# Patient Record
Sex: Female | Born: 1960 | Race: White | Hispanic: No | Marital: Married | Smoking: Never smoker
Health system: Southern US, Community
[De-identification: ages and names within clinical notes are randomized; demographics above are authoritative.]

## PROBLEM LIST (undated history)

## (undated) DIAGNOSIS — I8390 Asymptomatic varicose veins of unspecified lower extremity: Secondary | ICD-10-CM

## (undated) DIAGNOSIS — N6019 Diffuse cystic mastopathy of unspecified breast: Secondary | ICD-10-CM

## (undated) DIAGNOSIS — R600 Localized edema: Secondary | ICD-10-CM

## (undated) DIAGNOSIS — I872 Venous insufficiency (chronic) (peripheral): Secondary | ICD-10-CM

## (undated) DIAGNOSIS — I82409 Acute embolism and thrombosis of unspecified deep veins of unspecified lower extremity: Secondary | ICD-10-CM

## (undated) DIAGNOSIS — E039 Hypothyroidism, unspecified: Secondary | ICD-10-CM

## (undated) HISTORY — PX: OTHER SURGICAL HISTORY: SHX169

## (undated) HISTORY — DX: Acute embolism and thrombosis of unspecified deep veins of unspecified lower extremity: I82.409

## (undated) HISTORY — DX: Asymptomatic varicose veins of unspecified lower extremity: I83.90

## (undated) HISTORY — PX: DILATION AND CURETTAGE OF UTERUS: SHX78

## (undated) HISTORY — DX: Venous insufficiency (chronic) (peripheral): I87.2

## (undated) HISTORY — DX: Localized edema: R60.0

## (undated) HISTORY — DX: Hypothyroidism, unspecified: E03.9

## (undated) HISTORY — DX: Diffuse cystic mastopathy of unspecified breast: N60.19

---

## 2001-03-05 DIAGNOSIS — I82409 Acute embolism and thrombosis of unspecified deep veins of unspecified lower extremity: Secondary | ICD-10-CM

## 2001-03-05 HISTORY — DX: Acute embolism and thrombosis of unspecified deep veins of unspecified lower extremity: I82.409

## 2003-03-06 HISTORY — PX: TUBAL LIGATION: SHX77

## 2004-09-15 ENCOUNTER — Ambulatory Visit: Payer: Self-pay | Admitting: General Surgery

## 2004-09-29 ENCOUNTER — Ambulatory Visit: Payer: Self-pay | Admitting: General Surgery

## 2005-07-13 ENCOUNTER — Ambulatory Visit: Payer: Self-pay

## 2005-08-24 ENCOUNTER — Ambulatory Visit: Payer: Self-pay | Admitting: General Surgery

## 2006-03-05 HISTORY — PX: OTHER SURGICAL HISTORY: SHX169

## 2006-12-04 ENCOUNTER — Ambulatory Visit: Payer: Self-pay | Admitting: General Surgery

## 2006-12-25 ENCOUNTER — Ambulatory Visit: Payer: Self-pay | Admitting: General Surgery

## 2007-07-18 ENCOUNTER — Ambulatory Visit: Payer: Self-pay | Admitting: General Surgery

## 2009-04-12 ENCOUNTER — Ambulatory Visit: Payer: Self-pay

## 2009-04-15 ENCOUNTER — Ambulatory Visit: Payer: Self-pay

## 2009-04-20 ENCOUNTER — Ambulatory Visit: Payer: Self-pay | Admitting: General Surgery

## 2009-10-19 ENCOUNTER — Ambulatory Visit: Payer: Self-pay | Admitting: Internal Medicine

## 2009-10-19 DIAGNOSIS — E039 Hypothyroidism, unspecified: Secondary | ICD-10-CM | POA: Insufficient documentation

## 2009-10-19 DIAGNOSIS — I872 Venous insufficiency (chronic) (peripheral): Secondary | ICD-10-CM | POA: Insufficient documentation

## 2009-10-21 LAB — CONVERTED CEMR LAB
ALT: 16 units/L (ref 0–35)
Albumin: 4 g/dL (ref 3.5–5.2)
Basophils Relative: 0.8 % (ref 0.0–3.0)
CO2: 28 meq/L (ref 19–32)
Calcium: 8.9 mg/dL (ref 8.4–10.5)
Cholesterol: 191 mg/dL (ref 0–200)
Creatinine, Ser: 0.7 mg/dL (ref 0.4–1.2)
Eosinophils Relative: 0.6 % (ref 0.0–5.0)
Free T4: 1.06 ng/dL (ref 0.60–1.60)
HCT: 39.3 % (ref 36.0–46.0)
HDL: 46.9 mg/dL (ref >39.00)
Lymphs Abs: 2.5 10*3/uL (ref 0.7–4.0)
MCHC: 34.2 g/dL (ref 30.0–36.0)
MCV: 89.2 fL (ref 78.0–100.0)
Monocytes Absolute: 0.5 10*3/uL (ref 0.1–1.0)
Platelets: 354 10*3/uL (ref 150.0–400.0)
Sodium: 138 meq/L (ref 135–145)
TSH: 1.5 microintl units/mL (ref 0.35–5.50)
Total Bilirubin: 0.3 mg/dL (ref 0.3–1.2)
Total Protein: 7 g/dL (ref 6.0–8.3)
Triglycerides: 154 mg/dL — ABNORMAL HIGH (ref 0.0–149.0)
WBC: 8.5 10*3/uL (ref 4.5–10.5)

## 2009-12-02 ENCOUNTER — Telehealth: Payer: Self-pay | Admitting: Internal Medicine

## 2009-12-02 DIAGNOSIS — R079 Chest pain, unspecified: Secondary | ICD-10-CM | POA: Insufficient documentation

## 2009-12-08 ENCOUNTER — Telehealth: Payer: Self-pay | Admitting: Internal Medicine

## 2010-04-04 NOTE — Progress Notes (Signed)
  Phone Note Call from Patient   Caller: Patient Summary of Call: Called the patient to schedule appt with Cardiology.  Patient was seen by Dr Gwen Pounds at [facility] yesterday 12/07/2009 , he did an EKG which he said it was not completely normal but said since BP and Chol were all good he wasnt too concerned with it. He is wanting her to have a Stress Test now but patient said she told him she would call him back about that. She asked that the records be sent to you. Initial call taken by: Carlton Adam,  December 08, 2009 10:56 AM  Follow-up for Phone Call        Okay Not much more to do since I have to review his records.  I would defer to his decision about a stress test Follow-up by: Cindee Salt MD,  December 08, 2009 12:54 PM

## 2010-04-04 NOTE — Assessment & Plan Note (Signed)
Summary: NEW PT TO EST/CLE   Vital Signs:  Patient profile:   50 year old female Height:      63 inches Weight:      164 pounds BMI:     29.16 Temp:     98.4 degrees F oral Pulse rate:   68 / minute Pulse rhythm:   regular BP sitting:   100 / 70  (left arm) Cuff size:   regular  Vitals Entered By: Mervin Hack CMA [facility]) (October 19, 2009 12:03 PM) CC: new patient to establish care   History of Present Illness: Wants to establish with primary care physician  Dr Sherrie Mustache and [facility] in past Does see Dr Luella Cook for gyn care Dr Evette Cristal does breast exams due to past abnormalities  ONly has hypothyroidism Goes back to 2006 or so  Had DVT in 2003 on OCP. Off since does get occ swelling  did have vein procedure but not much help sits much of the day only intermittent problems  Preventive Screening-Counseling & Management  Alcohol-Tobacco     Smoking Status: never  Allergies (verified): No Known Drug Allergies  Past History:  Past Medical History: Hypothyroidism DVT 2003   (on birth control pills) Venous insufficiency  Past Surgical History: Vein procedure --Dr Evette Cristal Tubal ligation D&C  ~2010  Family History: Parents are the Meeks (he is stepfather) Not familiar with biologic Dad's medical history Mom has CHF, hypothyroid, DM 2 sister, 1 brother Breast and ovarian cancer in a maternal aunt (and in some distant cousins). Believes they are BRCA positive Maternal aunt died of melanoma  Social History: Occupation:  Optometrist for [employer] Married--- 1 daughter Never Smoked Alcohol use-no Occupation:  employed Smoking Status:  never  Review of Systems General:  Complains of sleep disorder; has been gaining weight--makes her feel tired and sluggist no recent exercise but has gone to gym for spinning in past Wears seat belt. Eyes:  Denies double vision and vision loss-1 eye; has contacts. ENT:  Denies decreased hearing and ringing in  ears; teeth okay--regular check ups. CV:  Denies chest pain or discomfort, difficulty breathing at night, difficulty breathing while lying down, fainting, lightheadness, palpitations, and shortness of breath with exertion. Resp:  Denies cough and shortness of breath. GI:  Denies abdominal pain, bloody stools, change in bowel habits, dark tarry stools, indigestion, nausea, and vomiting. GU:  Denies dysuria and incontinence; Periods are regular No sexual problems. MS:  Denies joint pain and joint swelling. Derm:  Denies lesion(s) and rash. Neuro:  Complains of headaches; denies numbness, tingling, and weakness; fairly regular headaches---relates to stress. Only occ takes meds. Psych:  Denies anxiety and depression; stress with stepdad's illnesses and job stress (works 2 part time jobs also---bookkeeping). Heme:  Denies abnormal bruising and enlarge lymph nodes. Allergy:  Denies seasonal allergies and sneezing.  Physical Exam  General:  alert and normal appearance.   Eyes:  pupils equal, pupils round, and pupils reactive to light.   Ears:  R ear normal and L ear normal.   Mouth:  no erythema and no exudates.   Neck:  supple, no masses, no thyromegaly, no carotid bruits, and no cervical lymphadenopathy.   Lungs:  normal respiratory effort, no intercostal retractions, no accessory muscle use, and normal breath sounds.   Heart:  normal rate, regular rhythm, no murmur, and no gallop.   Abdomen:  soft, non-tender, and no masses.   Msk:  no joint tenderness and no joint swelling.  Pulses:  2+ in feet Extremities:  thick calves without pitting Neurologic:  alert & oriented X3, strength normal in all extremities, and gait normal.   Skin:  no suspicious lesions.  Slight non specific speckled red rash along lateral neck Axillary Nodes:  No palpable lymphadenopathy Psych:  normally interactive, good eye contact, not anxious appearing, and not depressed appearing.     Impression &  Recommendations:  Problem # 1:  PREVENTIVE HEALTH CARE (ICD-V70.0) Assessment Comment Only Healthy discussed fitness will check chol level  Problem # 2:  HYPOTHYROIDISM (ICD-244.9) Assessment: Comment Only  will check labs clinically euthyroid  Her updated medication list for this problem includes:    Synthroid 100 Mcg Tabs (Levothyroxine sodium) .Marland Kitchen... Take 1 by mouth once daily  Orders: Venipuncture (52841) TLB-TSH (Thyroid Stimulating Hormone) (84443-TSH) TLB-T4 (Thyrox), Free [phone]) TLB-Lipid Panel (80061-LIPID)  Problem # 3:  VENOUS INSUFFICIENCY (ICD-459.81) Assessment: Comment Only  discussed support hose if ongoing swelling or pain takes the ASA for this  Orders: TLB-Renal Function Panel (80069-RENAL) TLB-CBC Platelet - w/Differential (85025-CBCD) TLB-Hepatic/Liver Function Pnl (80076-HEPATIC)  Complete Medication List: 1)  Synthroid 100 Mcg Tabs (Levothyroxine sodium) .... Take 1 by mouth once daily 2)  Aspirin 81 Mg Tabs (Aspirin) .... Take 1 by mouth once daily 3)  Vitamin D 1000 Unit Tabs (Cholecalciferol) .Marland Kitchen.. 1 tab daily  Patient Instructions: 1)  Please schedule a follow-up appointment in 1 year.   Prior Medications: SYNTHROID 100 MCG TABS (LEVOTHYROXINE SODIUM) take 1 by mouth once daily ASPIRIN 81 MG TABS (ASPIRIN) take 1 by mouth once daily Current Allergies (reviewed today): No known allergies    Immunization History:  Tetanus/Td Immunization History:    Tetanus/Td:  Historical (10/03/2001)

## 2010-04-04 NOTE — Progress Notes (Signed)
Summary: Chest Pain  Phone Note Call from Patient Call back at Work Phone [phone]   Caller: Patient Call For: Cindee Salt MD Summary of Call: pt calling trying to get in to see Dr.Letvak today, pt was at another appt with Dr.Rosenow and she told him about her symptoms, feeling heavy in the chest, SOB and arm going numb. Dr.Rosenow suggested pt call her PCP or go to the ER. I advised pt that she would need to go to ER or Urgent care because Dr.Letvak wasn't in and this time of day most of the physicans are booked up. Pt states she doesn't want to go to the ER, pt states these symptoms happened last week and only lasted 10-40min. Pt states she is sitting in front of [facility] right now and want [place] to call and tell them she's there and need to be seen. I explained to pt it doesn't work like that, pt then asked if she could go to the urgent care right next door? and how long would it take them to run a EKG on her? Pt is going to the urgent care to be seen now. Initial call taken by: Mervin Hack CMA [facility]),  December 02, 2009 3:46 PM  Follow-up for Phone Call        Please check on her Cindee Salt MD  December 03, 2009 9:15 AM   spoke with patient and she went to [facility] beside [place] across from the hospital. Per pt, they both told her they couldn't see her, because they couldn't run the necessary tests that needed to be ran for a woman with chest pain. Pt also went to [facility] ER and they told her it was a 7hr wait, so she left. Pt states she did schedule appt with [facility] clinic on wednesday and they will do a EKG, Stress test & lab test to check for enzymes. Pt states that if they find anything wrong she would like to see Dr.Letvak so he can "order me a [place] [address]." Pt would liike to know if all these tests are necessary? I tried to schedule an appt for Dr.Letvak but patient could only come late wednesday (Dr.Letvak is at nursing home then) or late  Friday afternoon. Please advise. DeShannon Smith CMA [facility])  December 05, 2009 4:02 PM   Please let her know that we can get her an appt with Dr Mariah Milling of [facility] here in [place]. Probably can get her in by Wednesday also Please have Shirlee Limerick check about getting her in if she would like to do this There is no way to know if a stress test is necessary before the evaluation. I could see her before lunch on Wednesday or afternoonn Thursday if she wants to wait for me. Follow-up by: Cindee Salt MD,  December 05, 2009 5:52 PM  Additional Follow-up for Phone Call Additional follow up Details #1::        spoke with patient she would like to see [facility]. Wednesday afternoon or Friday afternoon, pt doesn't want to take off work. Please advise DeShannon Katrinka Blazing CMA [facility])  December 06, 2009 10:35 AM   we'll see if we can get her in Additional Follow-up by: Cindee Salt MD,  December 06, 2009 1:32 PM  New Problems: CHEST PAIN (ICD-786.50)   New Problems: CHEST PAIN (ICD-786.50)

## 2010-07-26 ENCOUNTER — Ambulatory Visit: Payer: Self-pay | Admitting: General Surgery

## 2011-08-10 ENCOUNTER — Ambulatory Visit: Payer: Self-pay | Admitting: General Surgery

## 2012-06-23 ENCOUNTER — Other Ambulatory Visit: Payer: Self-pay | Admitting: Internal Medicine

## 2012-06-23 ENCOUNTER — Encounter: Payer: Self-pay | Admitting: Internal Medicine

## 2012-06-23 ENCOUNTER — Ambulatory Visit (INDEPENDENT_AMBULATORY_CARE_PROVIDER_SITE_OTHER): Payer: BC Managed Care – PPO | Admitting: Internal Medicine

## 2012-06-23 VITALS — BP 120/82 | HR 77 | Temp 98.5°F | Wt 174.8 lb

## 2012-06-23 DIAGNOSIS — R5381 Other malaise: Secondary | ICD-10-CM

## 2012-06-23 DIAGNOSIS — E039 Hypothyroidism, unspecified: Secondary | ICD-10-CM

## 2012-06-23 DIAGNOSIS — R5383 Other fatigue: Secondary | ICD-10-CM | POA: Insufficient documentation

## 2012-06-23 LAB — BASIC METABOLIC PANEL
Calcium: 8.9 mg/dL (ref 8.4–10.5)
GFR: 84.23 mL/min (ref >60.00)
Sodium: 136 mEq/L (ref 135–145)

## 2012-06-23 LAB — CBC WITH DIFFERENTIAL/PLATELET
Basophils Absolute: 0.1 10*3/uL (ref 0.0–0.1)
Basophils Relative: 0.7 % (ref 0.0–3.0)
Hemoglobin: 13.4 g/dL (ref 12.0–15.0)
Lymphocytes Relative: 28.8 % (ref 12.0–46.0)
Monocytes Relative: 6.2 % (ref 3.0–12.0)
Neutro Abs: 4.6 10*3/uL (ref 1.4–7.7)
RBC: 4.61 Mil/uL (ref 3.87–5.11)

## 2012-06-23 LAB — HEPATIC FUNCTION PANEL
ALT: 17 U/L (ref 0–35)
Alkaline Phosphatase: 85 U/L (ref 39–117)
Bilirubin, Direct: 0 mg/dL (ref 0.0–0.3)
Total Protein: 7.6 g/dL (ref 6.0–8.3)

## 2012-06-23 LAB — T4, FREE: Free T4: 0.72 ng/dL (ref 0.60–1.60)

## 2012-06-23 NOTE — Addendum Note (Signed)
Addended by: Alvina Chou on: 06/23/2012 02:12 PM   Modules accepted: Orders

## 2012-06-23 NOTE — Progress Notes (Signed)
Subjective:    Patient ID: Taylor Frederick, female    DOB: 1960/12/11, 52 y.o.   MRN: [id number]  HPI Still sees Dr Luella Cook for gyn care and Dr Lemar Livings for breast exams  Here because she stays tired all the time Knows it could be related to her schedule Was having some bad pain in the night and needed motrin---worried about heart or gallbladder (this was tested by Dr Evette Cristal) Then more like legs and ankles-- "feel tired and achy"  Able to go to both jobs still Tired upon arising in the morning despite 7 hours in bed usually No time for exercise Keeps grandson after work---he and daughter live with them (daughter works in the evening) Works up to 60 hours per week over 6 days---only has Saturday off  Not depressed Denies anhedonic---especially loves being with grandson  No current outpatient prescriptions on file prior to visit.   No current facility-administered medications on file prior to visit.    No Known Allergies  Past Medical History  Diagnosis Date  . Hypothyroidism   . DVT (deep venous thrombosis) 2003    With BCP's  . Venous insufficiency     Past Surgical History  Procedure Laterality Date  . Vein procedure      Dr. Evette Cristal  . Tubal ligation    . Dilation and curettage of uterus  ~2003    Family History  Problem Relation Age of Onset  . Heart disease Mother     CHF  . Diabetes Mother   . Hypothyroidism Mother   . Cancer Maternal Aunt     Breast and Ovarian  (Believes BRCA positive)  . Cancer Other     Breast and ovarian CA (Believes BRCA positive)  . Cancer Maternal Aunt     Melanoma    History   Social History  . Marital Status: Married    Spouse Name: N/A    Number of Children: 1  . Years of Education: N/A   Occupational History  . Engineer, mining  . Office work     Part-time--[employer]   Social History Main Topics  . Smoking status: Never Smoker   . Smokeless tobacco: Not on file  . Alcohol  Use: No  . Drug Use: Not on file  . Sexually Active: Not on file   Other Topics Concern  . Not on file   Social History Narrative  . No narrative on file   Review of Systems Weight is up 10# since last visit in 2011 Appetite is okay No chest pain No dizziness or syncope--mild orthostatic dizziness at times No cough or SOB. Mild URI for the past 2 days Bowels are okay Some stress incontinence Does get some heartburn    Objective:   Physical Exam  Constitutional: She appears well-developed and well-nourished. No distress.  HENT:  Mouth/Throat: Oropharynx is clear and moist. No oropharyngeal exudate.  Neck: Normal range of motion. Neck supple. No thyromegaly present.  Cardiovascular: Normal rate, regular rhythm, normal heart sounds and intact distal pulses.  Exam reveals no gallop.   No murmur heard. Pulmonary/Chest: Effort normal and breath sounds normal. No respiratory distress. She has no wheezes. She has no rales.  Abdominal: Soft. She exhibits no mass. There is no tenderness.  No HSM  Musculoskeletal: She exhibits no edema and no tenderness.  No active synovitis  Lymphadenopathy:    She has no cervical adenopathy.    She has no axillary adenopathy.  Right: No inguinal adenopathy present.       Left: No inguinal adenopathy present.  Skin: No rash noted.  Psychiatric: She has a normal mood and affect. Her behavior is normal.          Assessment & Plan:

## 2012-06-23 NOTE — Assessment & Plan Note (Signed)
Sounds euthyroid Will check labs

## 2012-06-23 NOTE — Assessment & Plan Note (Signed)
No worrisome symptoms Working too many hours No vacation in at least a year Discussed that her aching, non restorative sleep, etc are probably from this Will check labs Probably needs to give up her second job

## 2012-06-26 ENCOUNTER — Encounter: Payer: Self-pay | Admitting: *Deleted

## 2012-07-16 ENCOUNTER — Telehealth: Payer: Self-pay | Admitting: *Deleted

## 2012-07-16 NOTE — Telephone Encounter (Signed)
Patient faxed over a health screening from that needs to be filled out before 08/02/2012, pt was in 06/2012 and had labs but her cholesterol wasn't checked, can she come in for cholesterol and nicotine testing? Please advise  Form on your desk.

## 2012-07-17 NOTE — Telephone Encounter (Signed)
Okay to set up the required tests Diagnosis is health maintenance (V70.0)

## 2012-07-21 NOTE — Telephone Encounter (Signed)
Spoke with patient and advised results  Lab appt scheduled  

## 2012-07-25 ENCOUNTER — Telehealth: Payer: Self-pay | Admitting: *Deleted

## 2012-07-25 ENCOUNTER — Other Ambulatory Visit (INDEPENDENT_AMBULATORY_CARE_PROVIDER_SITE_OTHER): Payer: BC Managed Care – PPO

## 2012-07-25 DIAGNOSIS — Z0000 Encounter for general adult medical examination without abnormal findings: Secondary | ICD-10-CM

## 2012-07-25 LAB — LIPID PANEL
Cholesterol: 185 mg/dL (ref 0–200)
HDL: 52.3 mg/dL (ref >39.00)
LDL Cholesterol: 105 mg/dL — ABNORMAL HIGH (ref 0–99)
VLDL: 28.2 mg/dL (ref 0.0–40.0)

## 2012-07-25 NOTE — Telephone Encounter (Signed)
Pt here for labs this am, she was concerned about previous lab results, the sed rate was elevated. She says she is still having fatigue and body aches, also has a lot of swelling in feet and ankles (swelling has been going on for years) she elevates feet at work. Please advise?

## 2012-07-26 NOTE — Telephone Encounter (Signed)
As I indicated with last month's labs, the elevated sed rate is non specific---could be up with fatigue and sleep deprivation. It could indicate some other problem though, and that is why I recommended that she come in for a reevaluation if she doesn't feel better after getting some time off and rest

## 2012-07-30 NOTE — Addendum Note (Signed)
Addended by: Liane Comber C on: 07/30/2012 10:00 AM   Modules accepted: Orders

## 2012-07-31 LAB — NICOTINE/COTININE METABOLITES: Cotinine: <10 ng/mL

## 2012-08-01 NOTE — Telephone Encounter (Signed)
Spoke with patient and advised results Form filled out and faxed back to [facility], also copy faxed to pt She will wait until her f/u to discuss the sed rate

## 2012-08-11 ENCOUNTER — Ambulatory Visit: Payer: Self-pay | Admitting: General Surgery

## 2012-08-12 ENCOUNTER — Encounter: Payer: Self-pay | Admitting: General Surgery

## 2012-08-25 ENCOUNTER — Encounter: Payer: Self-pay | Admitting: General Surgery

## 2012-08-25 ENCOUNTER — Ambulatory Visit (INDEPENDENT_AMBULATORY_CARE_PROVIDER_SITE_OTHER): Payer: BC Managed Care – PPO | Admitting: General Surgery

## 2012-08-25 VITALS — BP 140/76 | HR 76 | Resp 14 | Ht >= 80 in | Wt 179.0 lb

## 2012-08-25 DIAGNOSIS — I8390 Asymptomatic varicose veins of unspecified lower extremity: Secondary | ICD-10-CM

## 2012-08-25 DIAGNOSIS — N6019 Diffuse cystic mastopathy of unspecified breast: Secondary | ICD-10-CM

## 2012-08-25 DIAGNOSIS — Z1211 Encounter for screening for malignant neoplasm of colon: Secondary | ICD-10-CM

## 2012-08-25 MED ORDER — POLYETHYLENE GLYCOL 3350 17 GM/SCOOP PO POWD: ORAL | Status: DC

## 2012-08-25 NOTE — Progress Notes (Signed)
Patient ID: Taylor Frederick, female   DOB: 04-22-1960, 52 y.o.   MRN: [id number]  Chief Complaint  Patient presents with  . Follow-up    mammogram    HPI Taylor Frederick is a 52 y.o. female who presents for a breast evaluation. The most recent mammogram was done on 08/11/12 with birad category 1. Patient does not perform regular self breast checks and does get regular mammograms done. The patient has a significant family history of breast cancer including two maternal aunts and a first cousin. She has had stress in the last year when she started working 6 days a week. Her recent labs are normal except  her sedratewas a little high. Dr. Alphonsus Sias is following this. HPI  Past Medical History  Diagnosis Date  . Hypothyroidism   . DVT (deep venous thrombosis) 2003    With BCP's  . Venous insufficiency   . Diffuse cystic mastopathy   . Asymptomatic varicose veins     Past Surgical History  Procedure Laterality Date  . Vein procedure      Dr. Evette Cristal  . Dilation and curettage of uterus  ~2003  . Tubal ligation  2005  . Vein closure Left 2008    Left Leg    Family History  Problem Relation Age of Onset  . Heart disease Mother     CHF  . Diabetes Mother   . Hypothyroidism Mother   . Cancer Maternal Aunt     Breast and Ovarian  (Believes BRCA positive)  . Cancer Other     Breast and ovarian CA (Believes BRCA positive)  . Cancer Maternal Aunt     Melanoma    Social History History  Substance Use Topics  . Smoking status: Never Smoker   . Smokeless tobacco: Never Used  . Alcohol Use: No    No Known Allergies  Current Outpatient Prescriptions  Medication Sig Dispense Refill  . aspirin 81 MG tablet Take 81 mg by mouth daily.      . Cholecalciferol (VITAMIN D) 1000 UNITS capsule Take 1,000 Units by mouth daily.      Marland Kitchen levothyroxine (SYNTHROID, LEVOTHROID) 100 MCG tablet Take 100 mcg by mouth daily before breakfast.      . polyethylene glycol powder (GLYCOLAX/MIRALAX) powder  255 grams one bottle for colonoscopy prep  255 g  0   No current facility-administered medications for this visit.    Review of Systems Review of Systems  HENT: Negative.   Respiratory: Negative.   Cardiovascular: Negative.     Blood pressure 140/76, pulse 76, resp. rate 14, height 6\' 11"  (2.108 m), weight 179 lb (81.194 kg), last menstrual period 08/04/2012.  Physical Exam Physical Exam  Constitutional: She is oriented to person, place, and time. She appears well-developed and well-nourished.  Eyes: No scleral icterus.  Cardiovascular: Normal rate, regular rhythm and normal heart sounds.   Pulmonary/Chest: Breath sounds normal. Right breast exhibits no inverted nipple, no mass, no nipple discharge, no skin change and no tenderness. Left breast exhibits no inverted nipple, no mass, no nipple discharge, no skin change and no tenderness.  Lymphadenopathy:    She has no cervical adenopathy.    She has no axillary adenopathy.  Neurological: She is alert and oriented to person, place, and time.  Skin: Skin is dry.    Data Reviewed Mammogram reviewed  Assessment    Stable exam. She is 52 yrs old now. Recommended screening colonoscopy.    Plan    Patient to have  a screening colonoscopy. Procedure and risks benefits discussed. She is agreeable      Patient has been scheduled for a colonoscopy on 10-01-12 at [facility].    Joydan Gretzinger G 08/25/2012, 9:22 PM

## 2012-08-25 NOTE — Patient Instructions (Addendum)
Patient has been scheduled for a colonoscopy on 10-01-12 at [facility].

## 2012-09-01 LAB — HM MAMMOGRAPHY: HM Mammogram: NORMAL

## 2012-09-11 ENCOUNTER — Telehealth: Payer: Self-pay | Admitting: *Deleted

## 2012-09-11 NOTE — Telephone Encounter (Signed)
Patient called in to cancel colonoscopy that was scheduled at [facility] for 10-01-12. She does not wish to reschedule at this time. Patient is unsure if she really wants to have completed.  Taylor Frederick in endoscopy notified of cancellation.

## 2012-10-24 ENCOUNTER — Encounter: Payer: BC Managed Care – PPO | Admitting: Internal Medicine

## 2012-12-19 ENCOUNTER — Encounter: Payer: BC Managed Care – PPO | Admitting: Internal Medicine

## 2013-05-01 ENCOUNTER — Other Ambulatory Visit: Payer: Self-pay | Admitting: General Surgery

## 2013-05-04 NOTE — Telephone Encounter (Signed)
Synthroid renewed-pt needs to stay on this.

## 2013-05-27 ENCOUNTER — Telehealth: Payer: Self-pay | Admitting: Internal Medicine

## 2013-05-27 NOTE — Telephone Encounter (Signed)
Okay to fit her in somewhere ?Friday afternoon?

## 2013-05-27 NOTE — Telephone Encounter (Signed)
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 Your next available appointment is in July.  Patient said she needs it done by the end of April.  Can patient be seen sooner for a physical?

## 2013-07-10 ENCOUNTER — Ambulatory Visit (INDEPENDENT_AMBULATORY_CARE_PROVIDER_SITE_OTHER): Payer: BC Managed Care – PPO | Admitting: Internal Medicine

## 2013-07-10 ENCOUNTER — Encounter: Payer: Self-pay | Admitting: Internal Medicine

## 2013-07-10 VITALS — BP 120/80 | HR 71 | Temp 98.5°F | Ht 63.0 in | Wt 180.0 lb

## 2013-07-10 DIAGNOSIS — Z23 Encounter for immunization: Secondary | ICD-10-CM

## 2013-07-10 DIAGNOSIS — Z1211 Encounter for screening for malignant neoplasm of colon: Secondary | ICD-10-CM

## 2013-07-10 DIAGNOSIS — E039 Hypothyroidism, unspecified: Secondary | ICD-10-CM

## 2013-07-10 DIAGNOSIS — Z0000 Encounter for general adult medical examination without abnormal findings: Secondary | ICD-10-CM | POA: Insufficient documentation

## 2013-07-10 LAB — LIPID PANEL
CHOL/HDL RATIO: 4.3 ratio
CHOLESTEROL: 182 mg/dL (ref 0–200)
HDL: 42 mg/dL (ref >39)
LDL Cholesterol: 120 mg/dL — ABNORMAL HIGH (ref 0–99)
Triglycerides: 98 mg/dL (ref <150)
VLDL: 20 mg/dL (ref 0–40)

## 2013-07-10 LAB — GLUCOSE, RANDOM: GLUCOSE: 94 mg/dL (ref 70–99)

## 2013-07-10 NOTE — Progress Notes (Signed)
Subjective:    Patient ID: Taylor Frederick, female    DOB: 06/16/1960, 53 y.o.   MRN: [id number]  HPI Here for physical  Still stays tired at times Has given notice on second job at [employer]--was just too much (and she was doing it for the sake of the owner there)  Not really exercising Weight is stable Discussed giving up soda--drinks one a day  Still sees Dr Jamal Collin for breast exams  Had colonoscopy scheduled but decided not to go--heard about someone dying from this (perforation) Sees Dr Laurey Morale-- recent endometrial biopsy for irregular periods. Hadn't heard the results.  Current Outpatient Prescriptions on File Prior to Visit  Medication Sig Dispense Refill  . aspirin 81 MG tablet Take 81 mg by mouth daily.      Marland Kitchen SYNTHROID 100 MCG tablet TAKE ONE (1) TABLET EACH DAY  30 tablet  12   No current facility-administered medications on file prior to visit.    No Known Allergies  Past Medical History  Diagnosis Date  . Hypothyroidism   . DVT (deep venous thrombosis) 2003    With BCP's  . Venous insufficiency   . Diffuse cystic mastopathy   . Asymptomatic varicose veins     Past Surgical History  Procedure Laterality Date  . Vein procedure      Dr. Jamal Collin  . Dilation and curettage of uterus  ~2003  . Tubal ligation  2005  . Vein closure Left 2008    Left Leg    Family History  Problem Relation Age of Onset  . Heart disease Mother     CHF  . Diabetes Mother   . Hypothyroidism Mother   . Cancer Maternal Aunt     Breast and Ovarian  (Believes BRCA positive)  . Cancer Other     Breast and ovarian CA (Believes BRCA positive)  . Cancer Maternal Aunt     Melanoma    History   Social History  . Marital Status: Married    Spouse Name: N/A    Number of Children: 1  . Years of Education: N/A   Occupational History  . 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 Smoking status: Never Smoker   . Smokeless tobacco: Never Used  . Alcohol Use: No  . Drug Use: No  . Sexual Activity: Not on file   Other Topics Concern  . Not on file   Social History Narrative  . No narrative on file   Review of Systems  Constitutional: Positive for fatigue. Negative for unexpected weight change.       Wears seat belt  HENT: Negative for dental problem, hearing loss and tinnitus.        Regular with dentist  Eyes: Negative for visual disturbance.       No diplopia or unilateral vision loss  Respiratory: Negative for cough, chest tightness and shortness of breath.   Cardiovascular: Positive for leg swelling. Negative for chest pain and palpitations.  Gastrointestinal: Negative for nausea, vomiting, abdominal pain, constipation and blood in stool.       Occ throat burning No swallowing problems  Endocrine: Negative for cold intolerance and heat intolerance.  Genitourinary: Negative for dysuria, hematuria, difficulty urinating and dyspareunia.  Musculoskeletal: Negative for arthralgias, back pain and joint swelling.       Gets achy when she first stands up  Skin: Negative for rash.       No suspicious lesions  Allergic/Immunologic: Negative for environmental allergies and immunocompromised state.  Neurological: Positive for dizziness and headaches. Negative for syncope, weakness, light-headedness and numbness.       Slight orthostatic dizziness Stress headaches  Hematological: Negative for adenopathy. Does not bruise/bleed easily.  Psychiatric/Behavioral: Negative for sleep disturbance and dysphoric mood. The patient is not nervous/anxious.        Objective:   Physical Exam  Constitutional: She is oriented to person, place, and time. She appears well-developed and well-nourished. No distress.  HENT:  Head: Normocephalic and atraumatic.  Right Ear: External ear normal.  Left Ear: External ear normal.  Mouth/Throat: Oropharynx is clear and moist.  No oropharyngeal exudate.  Eyes: Conjunctivae and EOM are normal. Pupils are equal, round, and reactive to light.  Neck: Normal range of motion. Neck supple. No thyromegaly present.  Cardiovascular: Normal rate, regular rhythm, normal heart sounds and intact distal pulses.  Exam reveals no gallop.   No murmur heard. Pulmonary/Chest: Effort normal and breath sounds normal. No respiratory distress. She has no wheezes. She has no rales.  Abdominal: Soft. There is no tenderness.  Musculoskeletal: She exhibits no edema and no tenderness.  Lymphadenopathy:    She has no cervical adenopathy.  Neurological: She is alert and oriented to person, place, and time.  Skin: No rash noted. No erythema.  Psychiatric: She has a normal mood and affect. Her behavior is normal.          Assessment & Plan:

## 2013-07-10 NOTE — Progress Notes (Signed)
Pre visit review using our clinic review tool, if applicable. No additional management support is needed unless otherwise documented below in the visit note. 

## 2013-07-10 NOTE — Assessment & Plan Note (Signed)
Healthy but out of shape Counseled She will do the colonoscopy

## 2013-07-10 NOTE — Patient Instructions (Signed)
DASH Diet The DASH diet stands for "Dietary Approaches to Stop Hypertension." It is a healthy eating plan that has been shown to reduce high blood pressure (hypertension) in as little as 14 days, while also possibly providing other significant health benefits. These other health benefits include reducing the risk of breast cancer after menopause and reducing the risk of type 2 diabetes, heart disease, colon cancer, and stroke. Health benefits also include weight loss and slowing kidney failure in patients with chronic kidney disease.  DIET GUIDELINES  Limit salt (sodium). Your diet should contain less than 1500 mg of sodium daily.  Limit refined or processed carbohydrates. Your diet should include mostly whole grains. Desserts and added sugars should be used sparingly.  Include small amounts of heart-healthy fats. These types of fats include nuts, oils, and tub margarine. Limit saturated and trans fats. These fats have been shown to be harmful in the body. CHOOSING FOODS  The following food groups are based on a 2000 calorie diet. See your Registered Dietitian for individual calorie needs. Grains and Grain Products (6 to 8 servings daily)  Eat More Often: Whole-wheat bread, brown rice, whole-grain or wheat pasta, quinoa, popcorn without added fat or salt (air popped).  Eat Less Often: White bread, white pasta, white rice, cornbread. Vegetables (4 to 5 servings daily)  Eat More Often: Fresh, frozen, and canned vegetables. Vegetables may be raw, steamed, roasted, or grilled with a minimal amount of fat.  Eat Less Often/Avoid: Creamed or fried vegetables. Vegetables in a cheese sauce. Fruit (4 to 5 servings daily)  Eat More Often: All fresh, canned (in natural juice), or frozen fruits. Dried fruits without added sugar. One hundred percent fruit juice ( cup [237 mL] daily).  Eat Less Often: Dried fruits with added sugar. Canned fruit in light or heavy syrup. Lean Meats, Fish, and Poultry (2  servings or less daily. One serving is 3 to 4 oz [85-114 g]).  Eat More Often: Ninety percent or leaner ground beef, tenderloin, sirloin. Round cuts of beef, chicken breast, turkey breast. All fish. Grill, bake, or broil your meat. Nothing should be fried.  Eat Less Often/Avoid: Fatty cuts of meat, turkey, or chicken leg, thigh, or wing. Fried cuts of meat or fish. Dairy (2 to 3 servings)  Eat More Often: Low-fat or fat-free milk, low-fat plain or light yogurt, reduced-fat or part-skim cheese.  Eat Less Often/Avoid: Milk (whole, 2%).Whole milk yogurt. Full-fat cheeses. Nuts, Seeds, and Legumes (4 to 5 servings per week)  Eat More Often: All without added salt.  Eat Less Often/Avoid: Salted nuts and seeds, canned beans with added salt. Fats and Sweets (limited)  Eat More Often: Vegetable oils, tub margarines without trans fats, sugar-free gelatin. Mayonnaise and salad dressings.  Eat Less Often/Avoid: Coconut oils, palm oils, butter, stick margarine, cream, half and half, cookies, candy, pie. FOR MORE INFORMATION The Dash Diet Eating Plan: www.dashdiet.org Document Released: 02/08/2011 Document Revised: 05/14/2011 Document Reviewed: 02/08/2011 ExitCare Patient Information 2014 [employer]. Exercise to Lose Weight Exercise and a healthy diet may help you lose weight. Your doctor may suggest specific exercises. EXERCISE IDEAS AND TIPS  Choose low-cost things you enjoy doing, such as walking, bicycling, or exercising to workout videos.  Take stairs instead of the elevator.  Walk during your lunch break.  Park your car further away from work or school.  Go to a gym or an exercise class.  Start with 5 to 10 minutes of exercise each day. Build up to 30 minutes   of exercise 4 to 6 days a week.  Wear shoes with good support and comfortable clothes.  Stretch before and after working out.  Work out until you breathe harder and your heart beats faster.  Drink extra water when  you exercise.  Do not do so much that you hurt yourself, feel dizzy, or get very short of breath. Exercises that burn about 150 calories:  Running 1  miles in 15 minutes.  Playing volleyball for 45 to 60 minutes.  Washing and waxing a car for 45 to 60 minutes.  Playing touch football for 45 minutes.  Walking 1  miles in 35 minutes.  Pushing a stroller 1  miles in 30 minutes.  Playing basketball for 30 minutes.  Raking leaves for 30 minutes.  Bicycling 5 miles in 30 minutes.  Walking 2 miles in 30 minutes.  Dancing for 30 minutes.  Shoveling snow for 15 minutes.  Swimming laps for 20 minutes.  Walking up stairs for 15 minutes.  Bicycling 4 miles in 15 minutes.  Gardening for 30 to 45 minutes.  Jumping rope for 15 minutes.  Washing windows or floors for 45 to 60 minutes. Document Released: 03/24/2010 Document Revised: 05/14/2011 Document Reviewed: 03/24/2010 Exit[employer] Patient Information 2014 [employer].  

## 2013-07-10 NOTE — Assessment & Plan Note (Signed)
Seems to be euthyroid Will check labs 

## 2013-07-10 NOTE — Addendum Note (Signed)
Addended by: Sueanne MargaritaSMITH, DESHANNON L on: 07/10/2013 03:35 PM   Modules accepted: Orders

## 2013-07-11 LAB — TSH: TSH: 13.661 u[IU]/mL — ABNORMAL HIGH (ref 0.350–4.500)

## 2013-07-11 LAB — T4, FREE: FREE T4: 1.11 ng/dL (ref 0.80–1.80)

## 2013-07-13 LAB — NICOTINE/COTININE METABOLITES

## 2013-07-15 ENCOUNTER — Other Ambulatory Visit: Payer: Self-pay | Admitting: *Deleted

## 2013-07-15 MED ORDER — LEVOTHYROXINE SODIUM 112 MCG PO TABS: 112.0000 ug | ORAL_TABLET | Freq: Every day | ORAL | Status: DC

## 2013-08-03 LAB — HM PAP SMEAR

## 2013-08-13 ENCOUNTER — Encounter: Payer: Self-pay | Admitting: Internal Medicine

## 2013-09-02 ENCOUNTER — Encounter: Payer: Self-pay | Admitting: General Surgery

## 2013-09-10 ENCOUNTER — Ambulatory Visit (INDEPENDENT_AMBULATORY_CARE_PROVIDER_SITE_OTHER): Payer: BC Managed Care – PPO | Admitting: General Surgery

## 2013-09-10 ENCOUNTER — Encounter: Payer: Self-pay | Admitting: General Surgery

## 2013-09-10 VITALS — BP 140/80 | HR 78 | Resp 16 | Ht 63.0 in | Wt 183.0 lb

## 2013-09-10 DIAGNOSIS — N6019 Diffuse cystic mastopathy of unspecified breast: Secondary | ICD-10-CM

## 2013-09-10 DIAGNOSIS — Z1231 Encounter for screening mammogram for malignant neoplasm of breast: Secondary | ICD-10-CM

## 2013-09-10 DIAGNOSIS — Z803 Family history of malignant neoplasm of breast: Secondary | ICD-10-CM

## 2013-09-10 NOTE — Progress Notes (Signed)
Patient ID: Taylor Frederick, female   DOB: 07-11-1960, 53 y.o.   MRN: [id number]  Chief Complaint  Patient presents with  . Follow-up    mammogram    HPI Taylor Frederick is a 53 y.o. female who presents for a breast evaluation. The most recent mammogram was done on 08/25/13 at [facility].  Patient does perform regular self breast checks and gets regular mammograms done.  She has intermittent stasis discomfort in her legs occasionally. Has not  used compression hose.  HPI  Past Medical History  Diagnosis Date  . Hypothyroidism   . DVT (deep venous thrombosis) 2003    With BCP's  . Venous insufficiency   . Diffuse cystic mastopathy   . Asymptomatic varicose veins     Past Surgical History  Procedure Laterality Date  . Vein procedure      Dr. Jamal Collin  . Dilation and curettage of uterus  ~2003  . Tubal ligation  2005  . Vein closure Left 2008    Left Leg    Family History  Problem Relation Age of Onset  . Heart disease Mother     CHF  . Diabetes Mother   . Hypothyroidism Mother   . Cancer Maternal Aunt     Breast and Ovarian  (Believes BRCA positive)  . Cancer Other     Breast and ovarian CA (Believes BRCA positive)  . Cancer Maternal Aunt     Melanoma    Social History History  Substance Use Topics  . Smoking status: Never Smoker   . Smokeless tobacco: Never Used  . Alcohol Use: No    No Known Allergies  Current Outpatient Prescriptions  Medication Sig Dispense Refill  . aspirin 81 MG tablet Take 81 mg by mouth daily.      Marland Kitchen levothyroxine (SYNTHROID, LEVOTHROID) 112 MCG tablet Take 1 tablet (112 mcg total) by mouth daily.  90 tablet  3   No current facility-administered medications for this visit.    Review of Systems Review of Systems  Constitutional: Negative.   Respiratory: Negative.   Cardiovascular: Negative.     Blood pressure 140/80, pulse 78, resp. rate 16, height 5' 3"  (1.6 m), weight 183 lb (83.008 kg).  Physical Exam Physical Exam   Constitutional: She is oriented to person, place, and time. She appears well-developed and well-nourished.  Eyes: Conjunctivae are normal. No scleral icterus.  Cardiovascular: Normal rate, regular rhythm and normal heart sounds.   Few small residual VV seen in left calf. No phlebitis noted, no edema.  Pulmonary/Chest: Effort normal and breath sounds normal. Right breast exhibits no inverted nipple, no mass, no nipple discharge, no skin change and no tenderness. Left breast exhibits no inverted nipple, no mass, no nipple discharge, no skin change and no tenderness.  Abdominal: Soft. Normal appearance and bowel sounds are normal. There is no tenderness. A hernia ( small umbilical hernia) is present.  Lymphadenopathy:    She has no cervical adenopathy.    She has no axillary adenopathy.  Neurological: She is alert and oriented to person, place, and time.  Skin: Skin is warm.    Data Reviewed Mammogram reviewed and stable  Assessment    Stable exam. History FCD, VV. Pt had cancelled screening colonoscopy last yr- she will think about it.    Plan    The patient has been asked to return to the office in one year with a bilateral screening mammogram.       Abram Sax G 09/10/2013, 6:25 PM

## 2013-09-10 NOTE — Patient Instructions (Addendum)
The patient has been asked to return to the office in one year with a bilateral screening mammogram. Continue self breast exams. Call office for any new breast issues or concerns. Advised use of compression hose periodically if ahe has leg symptoms.

## 2014-01-04 ENCOUNTER — Encounter: Payer: Self-pay | Admitting: General Surgery

## 2014-06-14 ENCOUNTER — Telehealth: Payer: Self-pay | Admitting: Internal Medicine

## 2014-06-14 NOTE — Telephone Encounter (Signed)
[facility]

## 2014-06-14 NOTE — Telephone Encounter (Signed)
Pt has appt with Dr Alphonsus SiasLetvak on 06/15/14 at 11:15 AM.

## 2014-06-14 NOTE — Telephone Encounter (Signed)
Patient Name: Taylor Frederick DOB: [birth year] Initial CommKellie Simmeringent Caller states she began feeling bad on Saturday evening, back pain later that night, felt better in the morning, happens occasionally, feels it may be due to gall bladder or eating Nurse Assessment Nurse: Elijah Birkaldwell, RN, Stark BrayLynda Date/Time (Eastern Time): 06/14/2014 1:33:16 PM Confirm and document reason for call. If symptomatic, describe symptoms. ---Caller states she began feeling bad on Saturday evening with bloating, upper back pain/under shoulder blades across later that night. Felt better in the morning, happens occasionally/varies. Thinks it may be due to gall bladder or eating. No fever. Has the patient traveled out of the country within the last 30 days? ---Not Applicable Does the patient require triage? ---Yes Related visit to physician within the last 2 weeks? ---No Does the PT have any chronic conditions? (i.e. diabetes, asthma, etc.) ---Yes List chronic conditions. ---thyroid Did the patient indicate they were pregnant? ---No Guidelines Guideline Title Affirmed Question Affirmed Notes Back Pain [1] Age > 50 AND [2] no history of prior similar back pain Final Disposition User See PCP When Office is Open (within 3 days) Elijah Birkaldwell, RN, Hilton HotelsLynda Comments Caller states she has an appt. for bloodwork, etc scheduled May 17th. Wondered if she fasted in the am, if they could go ahead & get her bloodwork done while at the office tomorrow. She states her back pain seems to come on more at night, but it is variable & not predictable.

## 2014-06-15 ENCOUNTER — Encounter: Payer: Self-pay | Admitting: Internal Medicine

## 2014-06-15 ENCOUNTER — Ambulatory Visit (INDEPENDENT_AMBULATORY_CARE_PROVIDER_SITE_OTHER): Payer: BLUE CROSS/BLUE SHIELD | Admitting: Internal Medicine

## 2014-06-15 VITALS — BP 110/70 | HR 77 | Temp 98.4°F | Wt 179.0 lb

## 2014-06-15 DIAGNOSIS — R109 Unspecified abdominal pain: Secondary | ICD-10-CM

## 2014-06-15 DIAGNOSIS — R10A1 Flank pain, right side: Secondary | ICD-10-CM | POA: Insufficient documentation

## 2014-06-15 MED ORDER — OMEPRAZOLE 20 MG PO CPDR: 20.0000 mg | DELAYED_RELEASE_CAPSULE | Freq: Every day | ORAL | Status: DC

## 2014-06-15 NOTE — Patient Instructions (Signed)
Please take the omeprazole 20mg  daily on an empty stomach. We will discuss this again at your physical next month

## 2014-06-15 NOTE — Assessment & Plan Note (Signed)
Initial thought is gallbladder but exam negative and she did have ultrasound within the past few years Could also be from esophagitis intermittently. Does get burning in throat every week or 2.  Doesn't seem to be muscular or cardiac ??help with motrin Not picture of gastritis  [facility] Trial of omeprazole 20mg  daily for next couple of months If the pain recurs, will probably need to recheck RUQ ultrasound

## 2014-06-15 NOTE — Progress Notes (Signed)
Subjective:    Patient ID: Taylor Frederick, female    DOB: 06/29/1960, 54 y.o.   MRN: [id number]  HPI Here due to pain in upper back Bloated feeling started in evening 3 days ago Bad pain along right flank Not sharp but gnawing, "can't get comfortable" pain Has had several bouts with this Generally in the evening--- after eating No obvious pattern with food that she is aware of  First episode some time ago Did have ultrasound from Dr Jamal Collin at [facility] some years ago Usually just sitting around No N/V Just doesn't feel good though---like bloating and stomach unrest Hard to get comfortable position when the pain comes on--- uneasy rest at night Tried 2 ibuprofen--may have helped after a while Never tried antacid--not a gassy pain  Does get burning sensation in throat at times--hard to tell what triggers this No swallowing problems  Current Outpatient Prescriptions on File Prior to Visit  Medication Sig Dispense Refill  . aspirin 81 MG tablet Take 81 mg by mouth daily.    Marland Kitchen levothyroxine (SYNTHROID, LEVOTHROID) 112 MCG tablet Take 1 tablet (112 mcg total) by mouth daily. 90 tablet 3   No current facility-administered medications on file prior to visit.    No Known Allergies  Past Medical History  Diagnosis Date  . Hypothyroidism   . DVT (deep venous thrombosis) 2003    With BCP's  . Venous insufficiency   . Diffuse cystic mastopathy   . Asymptomatic varicose veins     Past Surgical History  Procedure Laterality Date  . Vein procedure      Dr. Jamal Collin  . Dilation and curettage of uterus  ~2003  . Tubal ligation  2005  . Vein closure Left 2008    Left Leg    Family History  Problem Relation Age of Onset  . Heart disease Mother     CHF  . Diabetes Mother   . Hypothyroidism Mother   . Cancer Maternal Aunt     Breast and Ovarian  (Believes BRCA positive)  . Cancer Other     Breast and ovarian CA (Believes BRCA positive)  . Cancer Maternal Aunt     Melanoma     History   Social History  . Marital Status: Married    Spouse Name: N/A  . Number of Children: 1  . Years of Education: N/A   Occupational History  . Merchant navy officer  . Office work     Part-time--[employer]   Social History Main Topics  . Smoking status: Never Smoker   . Smokeless tobacco: Never Used  . Alcohol Use: No  . Drug Use: No  . Sexual Activity: Not on file   Other Topics Concern  . Not on file   Social History Narrative   Review of Systems Bowels have been regular No fever    Objective:   Physical Exam  Constitutional: She appears well-developed and well-nourished. No distress.  Neck: Normal range of motion. Neck supple. No thyromegaly present.  Cardiovascular: Normal rate, regular rhythm and normal heart sounds.  Exam reveals no gallop.   No murmur heard. Pulmonary/Chest: Effort normal. No respiratory distress. She has no wheezes. She has no rales.  Abdominal: Soft. Bowel sounds are normal. She exhibits no distension and no mass. There is no tenderness. There is no rebound and no guarding.  Murphy's sign absent  Lymphadenopathy:    She has no cervical adenopathy.  Assessment & Plan:

## 2014-07-12 ENCOUNTER — Other Ambulatory Visit: Payer: Self-pay

## 2014-07-12 DIAGNOSIS — Z1231 Encounter for screening mammogram for malignant neoplasm of breast: Secondary | ICD-10-CM

## 2014-07-20 ENCOUNTER — Encounter: Payer: Self-pay | Admitting: Internal Medicine

## 2014-07-20 ENCOUNTER — Ambulatory Visit (INDEPENDENT_AMBULATORY_CARE_PROVIDER_SITE_OTHER): Payer: BLUE CROSS/BLUE SHIELD | Admitting: Internal Medicine

## 2014-07-20 VITALS — BP 110/80 | HR 72 | Temp 98.2°F | Ht 63.0 in | Wt 181.0 lb

## 2014-07-20 DIAGNOSIS — Z1211 Encounter for screening for malignant neoplasm of colon: Secondary | ICD-10-CM

## 2014-07-20 DIAGNOSIS — Z0000 Encounter for general adult medical examination without abnormal findings: Secondary | ICD-10-CM | POA: Diagnosis not present

## 2014-07-20 DIAGNOSIS — R5383 Other fatigue: Secondary | ICD-10-CM | POA: Diagnosis not present

## 2014-07-20 DIAGNOSIS — E039 Hypothyroidism, unspecified: Secondary | ICD-10-CM

## 2014-07-20 LAB — COMPREHENSIVE METABOLIC PANEL
ALT: 13 U/L (ref 0–35)
AST: 18 U/L (ref 0–37)
Albumin: 4.2 g/dL (ref 3.5–5.2)
Alkaline Phosphatase: 83 U/L (ref 39–117)
BUN: 10 mg/dL (ref 6–23)
CALCIUM: 9.4 mg/dL (ref 8.4–10.5)
CHLORIDE: 101 meq/L (ref 96–112)
CO2: 30 mEq/L (ref 19–32)
CREATININE: 0.98 mg/dL (ref 0.40–1.20)
GFR: 63.24 mL/min (ref >60.00)
Glucose, Bld: 88 mg/dL (ref 70–99)
Potassium: 3.9 mEq/L (ref 3.5–5.1)
SODIUM: 135 meq/L (ref 135–145)
TOTAL PROTEIN: 7.5 g/dL (ref 6.0–8.3)
Total Bilirubin: 0.3 mg/dL (ref 0.2–1.2)

## 2014-07-20 LAB — CBC WITH DIFFERENTIAL/PLATELET
BASOS PCT: 1.2 % (ref 0.0–3.0)
Basophils Absolute: 0.1 10*3/uL (ref 0.0–0.1)
Eosinophils Absolute: 0.1 10*3/uL (ref 0.0–0.7)
Eosinophils Relative: 1.4 % (ref 0.0–5.0)
HCT: 41.7 % (ref 36.0–46.0)
Hemoglobin: 14 g/dL (ref 12.0–15.0)
LYMPHS PCT: 31.1 % (ref 12.0–46.0)
Lymphs Abs: 2 10*3/uL (ref 0.7–4.0)
MCHC: 33.6 g/dL (ref 30.0–36.0)
MCV: 86.3 fl (ref 78.0–100.0)
Monocytes Absolute: 0.3 10*3/uL (ref 0.1–1.0)
Monocytes Relative: 4.1 % (ref 3.0–12.0)
NEUTROS PCT: 62.2 % (ref 43.0–77.0)
Neutro Abs: 3.9 10*3/uL (ref 1.4–7.7)
PLATELETS: 413 10*3/uL — AB (ref 150.0–400.0)
RBC: 4.83 Mil/uL (ref 3.87–5.11)
RDW: 13.3 % (ref 11.5–15.5)
WBC: 6.3 10*3/uL (ref 4.0–10.5)

## 2014-07-20 LAB — T4, FREE: Free T4: 0.33 ng/dL — ABNORMAL LOW (ref 0.60–1.60)

## 2014-07-20 LAB — LIPID PANEL
CHOL/HDL RATIO: 5
Cholesterol: 250 mg/dL — ABNORMAL HIGH (ref 0–200)
HDL: 49 mg/dL (ref >39.00)
LDL Cholesterol: 172 mg/dL — ABNORMAL HIGH (ref 0–99)
NonHDL: 201
Triglycerides: 144 mg/dL (ref 0.0–149.0)
VLDL: 28.8 mg/dL (ref 0.0–40.0)

## 2014-07-20 LAB — TSH: TSH: 74.12 u[IU]/mL — ABNORMAL HIGH (ref 0.35–4.50)

## 2014-07-20 MED ORDER — OMEPRAZOLE 20 MG PO CPDR: 20.0000 mg | DELAYED_RELEASE_CAPSULE | Freq: Every day | ORAL | Status: DC | PRN

## 2014-07-20 NOTE — Assessment & Plan Note (Signed)
Healthy but out of shape Will do fecal immunoassay UTD on other preventative care

## 2014-07-20 NOTE — Assessment & Plan Note (Signed)
Would make another adjustment if TSH still over 10

## 2014-07-20 NOTE — Assessment & Plan Note (Signed)
Chronic--due to lack of exercise and schedule Needs to restart exercise

## 2014-07-20 NOTE — Progress Notes (Signed)
Subjective:    Patient ID: Taylor Frederick, female    DOB: 1960/10/14, 54 y.o.   MRN: [id number]  HPI Here for physical  Hasn't had any problems with right flank pain since last visit Did take some over the counter prn acid med--never got the omeprazole Now burning in throat and back pain also gone No symptoms at all  Does see a gyn--Dr Laurey Morale Due for pap Sees [facility] for mammograms and breast exams--now goes to [facility]  Never got the colonoscopy--was scared  Still concerned about her weight Feels run down and tired all the time Then gets "ill" at times Gets easy bloating Has cut back on soft drinks Trying to improve eating Schedule is tough-- no time to exercise. Discussed options  Current Outpatient Prescriptions on File Prior to Visit  Medication Sig Dispense Refill  . aspirin 81 MG tablet Take 81 mg by mouth daily.    Marland Kitchen levothyroxine (SYNTHROID, LEVOTHROID) 112 MCG tablet Take 1 tablet (112 mcg total) by mouth daily. 90 tablet 3   No current facility-administered medications on file prior to visit.    No Known Allergies  Past Medical History  Diagnosis Date  . Hypothyroidism   . DVT (deep venous thrombosis) 2003    With BCP's  . Venous insufficiency   . Diffuse cystic mastopathy   . Asymptomatic varicose veins     Past Surgical History  Procedure Laterality Date  . Vein procedure      Dr. Jamal Collin  . Dilation and curettage of uterus  ~2003  . Tubal ligation  2005  . Vein closure Left 2008    Left Leg    Family History  Problem Relation Age of Onset  . Heart disease Mother     CHF  . Diabetes Mother   . Hypothyroidism Mother   . Cancer Maternal Aunt     Breast and Ovarian  (Believes BRCA positive)  . Cancer Other     Breast and ovarian CA (Believes BRCA positive)  . Cancer Maternal Aunt     Melanoma    History   Social History  . Marital Status: Married    Spouse Name: N/A  . Number of Children: 1  . Years of Education: N/A    Occupational History  . Merchant navy officer  .           Social History Main Topics  . Smoking status: Never Smoker   . Smokeless tobacco: Never Used  . Alcohol Use: No  . Drug Use: No  . Sexual Activity: Not on file   Other Topics Concern  . Not on file   Social History Narrative   Review of Systems  Constitutional: Positive for fatigue. Negative for unexpected weight change.       Wears seat belt  HENT: Negative for dental problem, hearing loss and tinnitus.        Keeps up with the dentist  Eyes: Negative for visual disturbance.       No diplopia or unilateral vision loss  Respiratory: Negative for cough, chest tightness and shortness of breath.   Cardiovascular: Positive for leg swelling. Negative for chest pain and palpitations.  Gastrointestinal: Negative for nausea, vomiting, abdominal pain, constipation and blood in stool.  Endocrine: Negative for polydipsia and polyuria.  Genitourinary: Negative for dysuria and hematuria.       Some dyspareunia --discussed lubricant  Musculoskeletal: Negative for back pain, joint swelling and arthralgias.  Skin: Negative for  rash.       No suspicious lesions  Allergic/Immunologic: Negative for environmental allergies and immunocompromised state.  Neurological: Positive for headaches. Negative for dizziness, syncope, weakness, light-headedness and numbness.       Ibuprofen helps ?pressure headaches  Hematological: Negative for adenopathy. Does not bruise/bleed easily.  Psychiatric/Behavioral: Negative for sleep disturbance and dysphoric mood. The patient is not nervous/anxious.        Stress from her schedule       Objective:   Physical Exam  Constitutional: She is oriented to person, place, and time. She appears well-developed and well-nourished. No distress.  HENT:  Head: Normocephalic and atraumatic.  Right Ear: External ear normal.  Left Ear: External ear normal.  Mouth/Throat: Oropharynx is clear  and moist. No oropharyngeal exudate.  Eyes: Conjunctivae and EOM are normal. Pupils are equal, round, and reactive to light.  Neck: Normal range of motion. Neck supple. No thyromegaly present.  Cardiovascular: Normal rate, regular rhythm, normal heart sounds and intact distal pulses.  Exam reveals no gallop.   No murmur heard. Pulmonary/Chest: Effort normal and breath sounds normal. No respiratory distress. She has no wheezes. She has no rales.  Abdominal: Soft. There is no tenderness.  Musculoskeletal: She exhibits no edema or tenderness.  Lymphadenopathy:    She has no cervical adenopathy.  Neurological: She is alert and oriented to person, place, and time.  Skin: No rash noted. No erythema.  Psychiatric: She has a normal mood and affect. Her behavior is normal.          Assessment & Plan:

## 2014-07-20 NOTE — Progress Notes (Signed)
Pre visit review using our clinic review tool, if applicable. No additional management support is needed unless otherwise documented below in the visit note. 

## 2014-07-21 ENCOUNTER — Other Ambulatory Visit: Payer: Self-pay | Admitting: Internal Medicine

## 2014-07-21 DIAGNOSIS — E039 Hypothyroidism, unspecified: Secondary | ICD-10-CM

## 2014-07-21 LAB — NICOTINE/COTININE METABOLITES

## 2014-08-03 ENCOUNTER — Telehealth: Payer: Self-pay | Admitting: Internal Medicine

## 2014-08-03 NOTE — Telephone Encounter (Signed)
Pt is calling to see if you have sent in the form to [facility], pt states that she has already given you the form at her last visit.  She needs to confirm that you have sent in to form, today is the deadline. Please call at [phone].

## 2014-08-03 NOTE — Telephone Encounter (Signed)
Form was faxed 07/22/2014, form also scanned into chart. Left message on machine that form was faxed

## 2014-09-02 ENCOUNTER — Other Ambulatory Visit (INDEPENDENT_AMBULATORY_CARE_PROVIDER_SITE_OTHER): Payer: BLUE CROSS/BLUE SHIELD

## 2014-09-02 DIAGNOSIS — E039 Hypothyroidism, unspecified: Secondary | ICD-10-CM | POA: Diagnosis not present

## 2014-09-02 LAB — T4, FREE: Free T4: 0.98 ng/dL (ref 0.60–1.60)

## 2014-09-02 LAB — TSH: TSH: 4.53 u[IU]/mL — AB (ref 0.35–4.50)

## 2014-09-07 ENCOUNTER — Encounter: Payer: Self-pay | Admitting: General Surgery

## 2014-09-08 ENCOUNTER — Ambulatory Visit: Payer: Self-pay | Admitting: General Surgery

## 2014-09-14 ENCOUNTER — Ambulatory Visit: Payer: Self-pay | Admitting: General Surgery

## 2014-09-20 ENCOUNTER — Encounter: Payer: Self-pay | Admitting: General Surgery

## 2014-09-20 ENCOUNTER — Ambulatory Visit (INDEPENDENT_AMBULATORY_CARE_PROVIDER_SITE_OTHER): Payer: BLUE CROSS/BLUE SHIELD | Admitting: General Surgery

## 2014-09-20 VITALS — BP 120/70 | HR 72 | Resp 12 | Ht 63.0 in | Wt 167.0 lb

## 2014-09-20 DIAGNOSIS — N6011 Diffuse cystic mastopathy of right breast: Secondary | ICD-10-CM

## 2014-09-20 DIAGNOSIS — N6012 Diffuse cystic mastopathy of left breast: Secondary | ICD-10-CM | POA: Diagnosis not present

## 2014-09-20 NOTE — Patient Instructions (Signed)
Patient will be asked to return to the office in one year with a bilateral screening mammogram. 

## 2014-09-20 NOTE — Progress Notes (Signed)
Patient ID: Taylor Frederick, female   DOB: 27-Mar-1960, 54 y.o.   MRN: [id number]  Chief Complaint  Patient presents with  . Follow-up    mammogram    HPI Taylor Frederick is a 54 y.o. female who presents for a breast evaluation. The most recent mammogram was done on 09/02/14.  Patient does perform regular self breast checks and gets regular mammograms done.    HPI  Past Medical History  Diagnosis Date  . Hypothyroidism   . DVT (deep venous thrombosis) 2003    With BCP's  . Venous insufficiency   . Diffuse cystic mastopathy   . Asymptomatic varicose veins     Past Surgical History  Procedure Laterality Date  . Vein procedure      Dr. Jamal Collin  . Dilation and curettage of uterus  ~2003  . Tubal ligation  2005  . Vein closure Left 2008    Left Leg    Family History  Problem Relation Age of Onset  . Heart disease Mother     CHF  . Diabetes Mother   . Hypothyroidism Mother   . Cancer Maternal Aunt     Breast and Ovarian  (Believes BRCA positive)  . Cancer Other     Breast and ovarian CA (Believes BRCA positive)  . Cancer Maternal Aunt     Melanoma    Social History History  Substance Use Topics  . Smoking status: Never Smoker   . Smokeless tobacco: Never Used  . Alcohol Use: No    No Known Allergies  Current Outpatient Prescriptions  Medication Sig Dispense Refill  . aspirin 81 MG tablet Take 81 mg by mouth daily.    Marland Kitchen levothyroxine (SYNTHROID, LEVOTHROID) 112 MCG tablet Take 1 tablet (112 mcg total) by mouth daily. 90 tablet 3  . omeprazole (PRILOSEC) 20 MG capsule Take 1 capsule (20 mg total) by mouth daily as needed. 1 capsule 0   No current facility-administered medications for this visit.    Review of Systems Review of Systems  Constitutional: Negative.   Respiratory: Negative.   Cardiovascular: Negative.     Blood pressure 120/70, pulse 72, resp. rate 12, height 5' 3"  (1.6 m), weight 167 lb (75.751 kg), last menstrual period  08/16/2014.  Physical Exam Physical Exam  Constitutional: She is oriented to person, place, and time. She appears well-developed and well-nourished.  Eyes: Conjunctivae are normal. No scleral icterus.  Neck: Neck supple.  Cardiovascular: Normal rate, regular rhythm and normal heart sounds.   Pulmonary/Chest: Effort normal and breath sounds normal. Right breast exhibits no inverted nipple, no mass, no nipple discharge, no skin change and no tenderness. Left breast exhibits no inverted nipple, no mass, no nipple discharge, no skin change and no tenderness.  Abdominal: Soft. Normal appearance and bowel sounds are normal. There is no hepatomegaly. There is no tenderness. A hernia ( small umbilical hernia ) is present.  Lymphadenopathy:    She has no cervical adenopathy.    She has no axillary adenopathy.  Neurological: She is alert and oriented to person, place, and time.  Skin: Skin is warm and dry.    Data Reviewed Mammogram reviewed and stable  Assessment     Stable exam. History FCD  Plan    Patient will be asked to return to the office in one year with a bilateral screening mammogram.     PCP:  Anselmo Pickler 09/21/2014, 9:08 AM

## 2014-09-21 ENCOUNTER — Encounter: Payer: Self-pay | Admitting: General Surgery

## 2014-09-21 DIAGNOSIS — N6011 Diffuse cystic mastopathy of right breast: Secondary | ICD-10-CM | POA: Insufficient documentation

## 2014-09-21 DIAGNOSIS — N6012 Diffuse cystic mastopathy of left breast: Principal | ICD-10-CM

## 2014-11-11 ENCOUNTER — Other Ambulatory Visit: Payer: Self-pay | Admitting: Internal Medicine

## 2015-01-04 HISTORY — PX: LEG SURGERY: SHX1003

## 2015-01-30 ENCOUNTER — Encounter: Payer: Self-pay | Admitting: Emergency Medicine

## 2015-01-30 ENCOUNTER — Emergency Department: Payer: BLUE CROSS/BLUE SHIELD

## 2015-01-30 ENCOUNTER — Inpatient Hospital Stay
Admission: EM | Admit: 2015-01-30 | Discharge: 2015-02-02 | DRG: 494 | Disposition: A | Discharge status: Home Health | Payer: BLUE CROSS/BLUE SHIELD | Attending: Surgery | Admitting: Surgery

## 2015-01-30 ENCOUNTER — Inpatient Hospital Stay: Payer: BLUE CROSS/BLUE SHIELD

## 2015-01-30 DIAGNOSIS — S82302A Unspecified fracture of lower end of left tibia, initial encounter for closed fracture: Secondary | ICD-10-CM | POA: Diagnosis not present

## 2015-01-30 DIAGNOSIS — I872 Venous insufficiency (chronic) (peripheral): Secondary | ICD-10-CM | POA: Diagnosis present

## 2015-01-30 DIAGNOSIS — E039 Hypothyroidism, unspecified: Secondary | ICD-10-CM | POA: Diagnosis present

## 2015-01-30 DIAGNOSIS — Z86718 Personal history of other venous thrombosis and embolism: Secondary | ICD-10-CM

## 2015-01-30 DIAGNOSIS — S82202A Unspecified fracture of shaft of left tibia, initial encounter for closed fracture: Secondary | ICD-10-CM | POA: Diagnosis present

## 2015-01-30 DIAGNOSIS — R200 Anesthesia of skin: Secondary | ICD-10-CM | POA: Diagnosis present

## 2015-01-30 DIAGNOSIS — W1830XA Fall on same level, unspecified, initial encounter: Secondary | ICD-10-CM | POA: Diagnosis present

## 2015-01-30 DIAGNOSIS — W11XXXA Fall on and from ladder, initial encounter: Secondary | ICD-10-CM | POA: Diagnosis present

## 2015-01-30 DIAGNOSIS — Z7982 Long term (current) use of aspirin: Secondary | ICD-10-CM

## 2015-01-30 DIAGNOSIS — Z79899 Other long term (current) drug therapy: Secondary | ICD-10-CM | POA: Diagnosis not present

## 2015-01-30 DIAGNOSIS — Z419 Encounter for procedure for purposes other than remedying health state, unspecified: Secondary | ICD-10-CM

## 2015-01-30 DIAGNOSIS — S82832A Other fracture of upper and lower end of left fibula, initial encounter for closed fracture: Secondary | ICD-10-CM

## 2015-01-30 LAB — URINALYSIS COMPLETE WITH MICROSCOPIC (ARMC ONLY)
BACTERIA UA: NONE SEEN
Bilirubin Urine: NEGATIVE
Glucose, UA: NEGATIVE mg/dL
HGB URINE DIPSTICK: NEGATIVE
Ketones, ur: NEGATIVE mg/dL
Nitrite: NEGATIVE
PH: 6 (ref 5.0–8.0)
PROTEIN: NEGATIVE mg/dL
SPECIFIC GRAVITY, URINE: 1.016 (ref 1.005–1.030)

## 2015-01-30 LAB — CBC WITH DIFFERENTIAL/PLATELET
BASOS ABS: 0.1 10*3/uL (ref 0–0.1)
Basophils Relative: 1 %
Eosinophils Absolute: 0.1 10*3/uL (ref 0–0.7)
Eosinophils Relative: 0 %
HEMATOCRIT: 40.4 % (ref 35.0–47.0)
Hemoglobin: 13.3 g/dL (ref 12.0–16.0)
LYMPHS ABS: 1.6 10*3/uL (ref 1.0–3.6)
Lymphocytes Relative: 9 %
MCH: 27.7 pg (ref 26.0–34.0)
MCHC: 33 g/dL (ref 32.0–36.0)
MCV: 84.2 fL (ref 80.0–100.0)
MONO ABS: 0.7 10*3/uL (ref 0.2–0.9)
Monocytes Relative: 4 %
Neutro Abs: 15.1 10*3/uL — ABNORMAL HIGH (ref 1.4–6.5)
Neutrophils Relative %: 86 %
Platelets: 419 10*3/uL (ref 150–440)
RBC: 4.8 MIL/uL (ref 3.80–5.20)
RDW: 12.7 % (ref 11.5–14.5)
WBC: 17.5 10*3/uL — AB (ref 3.6–11.0)

## 2015-01-30 LAB — BASIC METABOLIC PANEL
ANION GAP: 7 (ref 5–15)
BUN: 13 mg/dL (ref 6–20)
CO2: 27 mmol/L (ref 22–32)
Calcium: 9.1 mg/dL (ref 8.9–10.3)
Chloride: 103 mmol/L (ref 101–111)
Creatinine, Ser: 0.84 mg/dL (ref 0.44–1.00)
GFR calc Af Amer: >60 mL/min (ref >60)
GFR calc non Af Amer: >60 mL/min (ref >60)
GLUCOSE: 118 mg/dL — AB (ref 65–99)
POTASSIUM: 3.8 mmol/L (ref 3.5–5.1)
Sodium: 137 mmol/L (ref 135–145)

## 2015-01-30 MED ORDER — MORPHINE SULFATE (PF) 4 MG/ML IV SOLN
INTRAVENOUS | Status: AC
  Administered 2015-01-30: 4 mg via INTRAVENOUS
  Filled 2015-01-30: qty 1

## 2015-01-30 MED ORDER — ACETAMINOPHEN 325 MG PO TABS
650.0000 mg | ORAL_TABLET | Freq: Four times a day (QID) | ORAL | Status: DC | PRN
  Administered 2015-01-30: 650 mg via ORAL
  Filled 2015-01-30: qty 2

## 2015-01-30 MED ORDER — FLEET ENEMA 7-19 GM/118ML RE ENEM: 1.0000 | ENEMA | Freq: Once | RECTAL | Status: DC | PRN

## 2015-01-30 MED ORDER — KCL IN DEXTROSE-NACL 20-5-0.9 MEQ/L-%-% IV SOLN
INTRAVENOUS | Status: DC
  Administered 2015-01-30 – 2015-01-31 (×2): via INTRAVENOUS
  Filled 2015-01-30 (×4): qty 1000

## 2015-01-30 MED ORDER — DOCUSATE SODIUM 100 MG PO CAPS
100.0000 mg | ORAL_CAPSULE | Freq: Two times a day (BID) | ORAL | Status: DC
  Administered 2015-01-31 – 2015-02-02 (×4): 100 mg via ORAL
  Filled 2015-01-30 (×4): qty 1

## 2015-01-30 MED ORDER — ACETAMINOPHEN 325 MG PO TABS
ORAL_TABLET | ORAL | Status: AC
  Filled 2015-01-30: qty 2

## 2015-01-30 MED ORDER — ONDANSETRON HCL 4 MG/2ML IJ SOLN
INTRAMUSCULAR | Status: AC
  Administered 2015-01-30: 4 mg via INTRAVENOUS
  Filled 2015-01-30: qty 2

## 2015-01-30 MED ORDER — HYDROMORPHONE HCL 1 MG/ML IJ SOLN
1.0000 mg | INTRAMUSCULAR | Status: DC | PRN
  Administered 2015-01-31: 1 mg via INTRAVENOUS
  Administered 2015-01-31: 2 mg via INTRAVENOUS
  Filled 2015-01-30: qty 1
  Filled 2015-01-30: qty 2

## 2015-01-30 MED ORDER — ACETAMINOPHEN 650 MG RE SUPP: 650.0000 mg | Freq: Four times a day (QID) | RECTAL | Status: DC | PRN

## 2015-01-30 MED ORDER — OXYCODONE HCL 5 MG PO TABS
5.0000 mg | ORAL_TABLET | ORAL | Status: DC | PRN
  Administered 2015-01-30 – 2015-02-02 (×12): 5 mg via ORAL
  Filled 2015-01-30 (×3): qty 1
  Filled 2015-01-30: qty 2
  Filled 2015-01-30 (×8): qty 1

## 2015-01-30 MED ORDER — MORPHINE SULFATE (PF) 4 MG/ML IV SOLN
4.0000 mg | Freq: Once | INTRAVENOUS | Status: AC
  Administered 2015-01-30: 4 mg via INTRAVENOUS

## 2015-01-30 MED ORDER — BISACODYL 10 MG RE SUPP: 10.0000 mg | Freq: Every day | RECTAL | Status: DC | PRN

## 2015-01-30 MED ORDER — ONDANSETRON HCL 4 MG/2ML IJ SOLN
4.0000 mg | Freq: Once | INTRAMUSCULAR | Status: AC
  Administered 2015-01-30: 4 mg via INTRAVENOUS

## 2015-01-30 MED ORDER — MAGNESIUM HYDROXIDE 400 MG/5ML PO SUSP
30.0000 mL | Freq: Every day | ORAL | Status: DC | PRN
  Administered 2015-02-02: 30 mL via ORAL
  Filled 2015-01-30: qty 30

## 2015-01-30 MED ORDER — CEFAZOLIN SODIUM-DEXTROSE 2-3 GM-% IV SOLR
2.0000 g | INTRAVENOUS | Status: AC
  Administered 2015-01-31: 2 g via INTRAVENOUS
  Filled 2015-01-30: qty 50

## 2015-01-30 MED ORDER — PANTOPRAZOLE SODIUM 40 MG IV SOLR
40.0000 mg | Freq: Every day | INTRAVENOUS | Status: DC
Start: 2015-01-30 — End: 2015-02-02
  Administered 2015-01-31 – 2015-02-01 (×2): 40 mg via INTRAVENOUS
  Filled 2015-01-30 (×3): qty 40

## 2015-01-30 NOTE — ED Provider Notes (Addendum)
[facility] Emergency Department Provider Note  ____________________________________________  Time seen: Approximately 5:12 PM  I have reviewed the triage vital signs and the nursing notes.   HISTORY  Chief Complaint Ankle Pain    HPI Taylor Frederick is a 54 y.o. female with no chronic medical conditions other than hypothyroidism for which she takes Synthroid and who takes a daily 81 mg aspirin presents with severe sharp and stabbing pain in her left lower leg from the knee down to the ankle after falling off a ladder.  She states that she was 6 or 7 feet up in the air when she slipped and fell off a ladder landing primarily on the left lower extremity.  She had immediate acute onset of pain and inability to care weight on that leg.  She did not strike her head, did not lose consciousness, and has no headache or neck pain at this time.  She has a small abrasion on her right knee but no other injuries.  The pain is severe and worsened by movement, helped minimally by rest.  She has no loss of sensation in the affected extremity and a small amount of swelling.   Past Medical History  Diagnosis Date  . Hypothyroidism   . DVT (deep venous thrombosis) ([place]) 2003    With BCP's  . Venous insufficiency   . Diffuse cystic mastopathy   . Asymptomatic varicose veins     Patient Active Problem List   Diagnosis Date Noted  . Closed left tibial fracture 01/30/2015  . Fibrocystic disease of both breasts 09/21/2014  . Routine general medical examination at a health care facility 07/10/2013  . Fatigue 06/23/2012  . Hypothyroidism 10/19/2009  . VENOUS INSUFFICIENCY 10/19/2009    Past Surgical History  Procedure Laterality Date  . Vein procedure      Dr. Jamal Collin  . Dilation and curettage of uterus  ~2003  . Tubal ligation  2005  . Vein closure Left 2008    Left Leg    No current outpatient prescriptions on file.  Allergies Review of patient's allergies  indicates no known allergies.  Family History  Problem Relation Age of Onset  . Heart disease Mother     CHF  . Diabetes Mother   . Hypothyroidism Mother   . Cancer Maternal Aunt     Breast and Ovarian  (Believes BRCA positive)  . Cancer Other     Breast and ovarian CA (Believes BRCA positive)  . Cancer Maternal Aunt     Melanoma    Social History Social History  Substance Use Topics  . Smoking status: Never Smoker   . Smokeless tobacco: Never Used  . Alcohol Use: No    Review of Systems Constitutional: No fever/chills Eyes: No visual changes. ENT: No sore throat. Cardiovascular: Denies chest pain. Respiratory: Denies shortness of breath. Gastrointestinal: No abdominal pain.  No nausea, no vomiting.  No diarrhea.  No constipation. Genitourinary: Negative for dysuria. Musculoskeletal: Severe pain and inability to bear weight with a left lower extremity from the knee down to the foot Skin: Negative for rash. Neurological: Negative for headaches, focal weakness or numbness.  10-point ROS otherwise negative.  ____________________________________________   PHYSICAL EXAM:  VITAL SIGNS: ED Triage Vitals  Enc Vitals Group     BP 01/30/15 1555 146/76 mmHg     Pulse Rate 01/30/15 1555 86     Resp 01/30/15 1555 16     Temp 01/30/15 1555 98.8 F (37.1 C)  Temp Source 01/30/15 1555 Oral     SpO2 01/30/15 1555 99 %     Weight 01/30/15 1555 176 lb (79.833 kg)     Height 01/30/15 1555 _0  (1.6 m)     Head Cir --      Peak Flow --      Pain Score 01/30/15 1556 10     Pain Loc --      Pain Edu? --      Excl. in [place]? --     Constitutional: Alert and oriented. Well appearing and does appear to be in mild pain Eyes: Conjunctivae are normal. PERRL. EOMI. Head: Atraumatic. Nose: No congestion/rhinnorhea. Mouth/Throat: Mucous membranes are moist.  Oropharynx non-erythematous. Neck: No stridor.  No cervical spine tenderness to palpation. Cardiovascular: Normal rate,  regular rhythm. Grossly normal heart sounds.  Good peripheral circulation. Respiratory: Normal respiratory effort.  No retractions. Lungs CTAB. Gastrointestinal: Soft and nontender. No distention. No abdominal bruits. No CVA tenderness. Musculoskeletal: Pain and tenderness with palpation from the left knee distal to the left ankle.  Small amount of swelling.  Ecchymosis to the medial aspect of the left ankle and just proximal to that.  Compartments are soft and easily compressible.  Neurovascularly intact down to the foot.  No gross deformity. Neurologic:  Normal speech and language. No gross focal neurologic deficits are appreciated.  Skin:  Skin is warm, dry and intact. No rash noted. Psychiatric: Mood and affect are normal. Speech and behavior are normal.  ____________________________________________   LABS (all labs ordered are listed, but only abnormal results are displayed)  Labs Reviewed  CBC WITH DIFFERENTIAL/PLATELET - Abnormal; Notable for the following:    WBC 17.5 (*)    Neutro Abs 15.1 (*)    All other components within normal limits  BASIC METABOLIC PANEL - Abnormal; Notable for the following:    Glucose, Bld 118 (*)    All other components within normal limits  URINALYSIS COMPLETEWITH MICROSCOPIC ([facility] ONLY)   ____________________________________________  EKG  ED ECG REPORT I, Avalina Benko, the attending physician, personally viewed and interpreted this ECG.  Date: 01/30/2015 EKG Time: 17:58 Rate: 80 Rhythm: normal sinus rhythm QRS Axis: normal Intervals: normal ST/T Wave abnormalities: normal Conduction Disutrbances: none Narrative Interpretation: unremarkable  ____________________________________________  RADIOLOGY I, Finnlee Guarnieri, personally viewed and evaluated these images (plain radiographs) as part of my medical decision making.  Dg Knee 2 Views Left  01/30/2015  CLINICAL DATA:  [place] off a ladder with knee pain today EXAM: LEFT KNEE - 1-2 VIEW  COMPARISON:  None. FINDINGS: There is a mildly comminuted fracture to the fibular head. Fracture fragments are mildly displaced. IMPRESSION: Proximal fibular fracture Electronically Signed   By: Skipper Cliche M.D.   On: 01/30/2015 16:58   Dg Ankle Complete Left  01/30/2015  CLINICAL DATA:  Status post fall from a ladder today with onset of left lower leg pain. Initial encounter. EXAM: LEFT ANKLE COMPLETE - 3+ VIEW COMPARISON:  None. FINDINGS: The patient has an acute fracture of the distal tibia. The fracture extends from the medial cortex approximately 12.5 cm above the plafond in a spiral orientation through the lateral lip of the plafond. The posterior malleolus appears to be a separate fragment. The fracture is impacted approximately 2 cm. The distal fragment is medially displaced approximately 0.8 cm. No other fracture is identified. The tibiotalar joint is located. Soft tissue swelling is noted. IMPRESSION: Acute distal tibial fracture is described above. Electronically Signed   By: Marcello Moores  Dalessio M.D.   On: 01/30/2015 16:57   Dg Chest Portable 1 View  01/30/2015  CLINICAL DATA:  Preoperative examination for patient with a left tibial fracture. Initial encounter. EXAM: PORTABLE CHEST 1 VIEW COMPARISON:  None. FINDINGS: The lungs are clear. Heart size is normal. No pneumothorax or pleural effusion. No focal bony abnormality. IMPRESSION: Negative chest. Electronically Signed   By: Inge Rise M.D.   On: 01/30/2015 18:25    ____________________________________________   PROCEDURES  Procedure(s) performed: splint, see procedure note(s).  SPLINT APPLICATION Date: 24/79/9800 Authorized by: Hinda Kehr Consent: Verbal consent obtained. Risks and benefits: risks, benefits and alternatives were discussed Consent given by: patient Splint applied by: ED technician Location details: LLE Splint type: posterior short leg with sugar tong Supplies used: orthoglass Post-procedure: The  splinted body part was neurovascularly unchanged following the procedure. Patient tolerance: Patient tolerated the procedure well with no immediate complications.   Critical Care performed: No ____________________________________________   INITIAL IMPRESSION / ASSESSMENT AND PLAN / ED COURSE  Pertinent labs & imaging results that were available during my care of the patient were reviewed by me and considered in my medical decision making (see chart for details).  Significant fractures of the left lower extremity that will likely require operative fix.  I called and discussed the case by phone with Dr. Roland Rack who also personally reviewed the images.  He agreed that surgery is needed.  As per his recommendations we have splinted the patient and he has been in admission orders and likely take her to the operating room tomorrow.  She is stable with no other acute problems at this time.  ____________________________________________  FINAL CLINICAL IMPRESSION(S) / ED DIAGNOSES  Final diagnoses:  Closed fracture of left distal tibia, initial encounter  Closed fracture of proximal end of left fibula      NEW MEDICATIONS STARTED DURING THIS VISIT:  Current Discharge Medication List       Hinda Kehr, MD 01/30/15 2022  Hinda Kehr, MD 01/30/15 2023

## 2015-01-30 NOTE — ED Notes (Signed)
Patient presents to the ED with left ankle and foot pain after falling off a ladder.  Patient is unable to put any pressure on foot or leg.  Patient is tearful in triage.  Patient denies hitting her head or losing consciousness.  Patient is alert and oriented at this time.

## 2015-01-31 ENCOUNTER — Inpatient Hospital Stay: Payer: BLUE CROSS/BLUE SHIELD | Admitting: Anesthesiology

## 2015-01-31 ENCOUNTER — Inpatient Hospital Stay: Payer: BLUE CROSS/BLUE SHIELD

## 2015-01-31 ENCOUNTER — Encounter
Admission: EM | Disposition: A | Discharge status: Home Health | Payer: Self-pay | Source: Home / Self Care | Attending: Surgery

## 2015-01-31 ENCOUNTER — Telehealth: Payer: Self-pay | Admitting: *Deleted

## 2015-01-31 HISTORY — PX: ORIF ANKLE FRACTURE: SHX5408

## 2015-01-31 LAB — PREGNANCY, URINE: Preg Test, Ur: NEGATIVE

## 2015-01-31 SURGERY — OPEN REDUCTION INTERNAL FIXATION (ORIF) ANKLE FRACTURE
Anesthesia: General | Laterality: Left | Wound class: Clean

## 2015-01-31 MED ORDER — ACETAMINOPHEN 10 MG/ML IV SOLN
INTRAVENOUS | Status: DC | PRN
  Administered 2015-01-31: 1000 mg via INTRAVENOUS

## 2015-01-31 MED ORDER — PROPOFOL 10 MG/ML IV BOLUS
INTRAVENOUS | Status: DC | PRN
  Administered 2015-01-31: 200 mg via INTRAVENOUS

## 2015-01-31 MED ORDER — PROPOFOL 10 MG/ML IV BOLUS: INTRAVENOUS | Status: DC | PRN

## 2015-01-31 MED ORDER — ACETAMINOPHEN 325 MG PO TABS: 650.0000 mg | ORAL_TABLET | Freq: Four times a day (QID) | ORAL | Status: DC | PRN

## 2015-01-31 MED ORDER — ASPIRIN 81 MG PO CHEW
81.0000 mg | CHEWABLE_TABLET | Freq: Every day | ORAL | Status: DC
  Administered 2015-01-31 – 2015-02-02 (×3): 81 mg via ORAL
  Filled 2015-01-31 (×3): qty 1

## 2015-01-31 MED ORDER — DIPHENHYDRAMINE HCL 12.5 MG/5ML PO ELIX: 12.5000 mg | ORAL_SOLUTION | ORAL | Status: DC | PRN

## 2015-01-31 MED ORDER — NEOMYCIN-POLYMYXIN B GU 40-200000 IR SOLN
Status: DC | PRN
  Administered 2015-01-31: 4 mL

## 2015-01-31 MED ORDER — FENTANYL CITRATE (PF) 100 MCG/2ML IJ SOLN
INTRAMUSCULAR | Status: DC | PRN
  Administered 2015-01-31 (×8): 50 ug via INTRAVENOUS
  Administered 2015-01-31: 100 ug via INTRAVENOUS
  Administered 2015-01-31: 50 ug via INTRAVENOUS

## 2015-01-31 MED ORDER — ENOXAPARIN SODIUM 40 MG/0.4ML ~~LOC~~ SOLN
40.0000 mg | SUBCUTANEOUS | Status: DC
  Administered 2015-02-01 – 2015-02-02 (×2): 40 mg via SUBCUTANEOUS
  Filled 2015-01-31 (×2): qty 0.4

## 2015-01-31 MED ORDER — LIDOCAINE HCL (CARDIAC) 20 MG/ML IV SOLN
INTRAVENOUS | Status: DC | PRN
  Administered 2015-01-31: 100 mg via INTRAVENOUS

## 2015-01-31 MED ORDER — LACTATED RINGERS IV SOLN
INTRAVENOUS | Status: DC
  Administered 2015-01-31 (×2): via INTRAVENOUS

## 2015-01-31 MED ORDER — METOCLOPRAMIDE HCL 5 MG PO TABS: 5.0000 mg | ORAL_TABLET | Freq: Three times a day (TID) | ORAL | Status: DC | PRN

## 2015-01-31 MED ORDER — ACETAMINOPHEN 650 MG RE SUPP: 650.0000 mg | Freq: Four times a day (QID) | RECTAL | Status: DC | PRN

## 2015-01-31 MED ORDER — DEXAMETHASONE SODIUM PHOSPHATE 4 MG/ML IJ SOLN
INTRAMUSCULAR | Status: DC | PRN
  Administered 2015-01-31: 8 mg via INTRAVENOUS

## 2015-01-31 MED ORDER — MIDAZOLAM HCL 2 MG/2ML IJ SOLN
INTRAMUSCULAR | Status: DC | PRN
  Administered 2015-01-31: 2 mg via INTRAVENOUS

## 2015-01-31 MED ORDER — CEFAZOLIN SODIUM-DEXTROSE 2-3 GM-% IV SOLR
2.0000 g | Freq: Four times a day (QID) | INTRAVENOUS | Status: AC
  Administered 2015-01-31 – 2015-02-01 (×3): 2 g via INTRAVENOUS
  Filled 2015-01-31 (×3): qty 50

## 2015-01-31 MED ORDER — ONDANSETRON HCL 4 MG/2ML IJ SOLN
INTRAMUSCULAR | Status: DC | PRN
  Administered 2015-01-31: 4 mg via INTRAVENOUS

## 2015-01-31 MED ORDER — NEOMYCIN-POLYMYXIN B GU 40-200000 IR SOLN
Status: AC
Start: 2015-01-31 — End: 2015-01-31
  Filled 2015-01-31: qty 4

## 2015-01-31 MED ORDER — ONDANSETRON HCL 4 MG/2ML IJ SOLN: 4.0000 mg | Freq: Once | INTRAMUSCULAR | Status: DC | PRN

## 2015-01-31 MED ORDER — KCL IN DEXTROSE-NACL 20-5-0.9 MEQ/L-%-% IV SOLN
INTRAVENOUS | Status: DC
  Administered 2015-01-31 – 2015-02-01 (×2): via INTRAVENOUS
  Filled 2015-01-31 (×4): qty 1000

## 2015-01-31 MED ORDER — BUPIVACAINE HCL 0.5 % IJ SOLN
INTRAMUSCULAR | Status: DC | PRN
  Administered 2015-01-31: 30 mL

## 2015-01-31 MED ORDER — ONDANSETRON HCL 4 MG PO TABS: 4.0000 mg | ORAL_TABLET | Freq: Four times a day (QID) | ORAL | Status: DC | PRN

## 2015-01-31 MED ORDER — ONDANSETRON HCL 4 MG/2ML IJ SOLN: 4.0000 mg | Freq: Four times a day (QID) | INTRAMUSCULAR | Status: DC | PRN

## 2015-01-31 MED ORDER — BUPIVACAINE HCL (PF) 0.5 % IJ SOLN
INTRAMUSCULAR | Status: AC
  Filled 2015-01-31: qty 30

## 2015-01-31 MED ORDER — METOCLOPRAMIDE HCL 5 MG/ML IJ SOLN: 5.0000 mg | Freq: Three times a day (TID) | INTRAMUSCULAR | Status: DC | PRN

## 2015-01-31 MED ORDER — LABETALOL HCL 5 MG/ML IV SOLN
INTRAVENOUS | Status: DC | PRN
  Administered 2015-01-31: 5 mg via INTRAVENOUS

## 2015-01-31 MED ORDER — ACETAMINOPHEN 10 MG/ML IV SOLN
INTRAVENOUS | Status: AC
  Filled 2015-01-31: qty 100

## 2015-01-31 MED ORDER — FENTANYL CITRATE (PF) 100 MCG/2ML IJ SOLN: 25.0000 ug | INTRAMUSCULAR | Status: DC | PRN

## 2015-01-31 MED ORDER — LEVOTHYROXINE SODIUM 112 MCG PO TABS
112.0000 ug | ORAL_TABLET | Freq: Every day | ORAL | Status: DC
  Administered 2015-02-01 – 2015-02-02 (×2): 112 ug via ORAL
  Filled 2015-01-31 (×2): qty 1

## 2015-01-31 SURGICAL SUPPLY — 68 items
BANDAGE ACE 4X5 VEL STRL LF (GAUZE/BANDAGES/DRESSINGS) ×2 IMPLANT
BANDAGE ACE 6X5 VEL STRL LF (GAUZE/BANDAGES/DRESSINGS) ×2 IMPLANT
BANDAGE ELASTIC 4 CLIP ST LF (GAUZE/BANDAGES/DRESSINGS) ×2 IMPLANT
BANDAGE ELASTIC 6 CLIP ST LF (GAUZE/BANDAGES/DRESSINGS) ×2 IMPLANT
BIT DRILL 2.5X2.75 QC CALB (BIT) ×2 IMPLANT
BIT DRILL 2.9 CANN QC NONSTRL (BIT) ×2 IMPLANT
BIT DRILL CALIBRATED 2.7 (BIT) ×2 IMPLANT
BLADE SURG SZ10 CARB STEEL (BLADE) ×4 IMPLANT
BNDG COHESIVE 4X5 TAN STRL (GAUZE/BANDAGES/DRESSINGS) ×2 IMPLANT
BNDG ESMARK 6X12 TAN STRL LF (GAUZE/BANDAGES/DRESSINGS) ×2 IMPLANT
BNDG PLASTER FAST 4X5 WHT LF (CAST SUPPLIES) ×8 IMPLANT
CANISTER SUCT 1200ML W/VALVE (MISCELLANEOUS) ×2 IMPLANT
CHLORAPREP W/TINT 26ML (MISCELLANEOUS) ×4 IMPLANT
DRAPE C-ARM XRAY 36X54 (DRAPES) ×2 IMPLANT
DRAPE C-ARMOR (DRAPES) ×2 IMPLANT
DRAPE INCISE IOBAN 66X45 STRL (DRAPES) ×2 IMPLANT
DRAPE U-SHAPE 47X51 STRL (DRAPES) IMPLANT
ELECT CAUTERY BLADE 6.4 (BLADE) ×2 IMPLANT
GAUZE PETRO XEROFOAM 1X8 (MISCELLANEOUS) ×2 IMPLANT
GAUZE SPONGE 4X4 12PLY STRL (GAUZE/BANDAGES/DRESSINGS) ×2 IMPLANT
GLOVE BIO SURGEON STRL SZ7.5 (GLOVE) ×4 IMPLANT
GLOVE BIO SURGEON STRL SZ8 (GLOVE) ×12 IMPLANT
GLOVE INDICATOR 8.0 STRL GRN (GLOVE) ×2 IMPLANT
GOWN STRL REUS W/ TWL LRG LVL3 (GOWN DISPOSABLE) ×3 IMPLANT
GOWN STRL REUS W/ TWL XL LVL3 (GOWN DISPOSABLE) ×1 IMPLANT
GOWN STRL REUS W/TWL LRG LVL3 (GOWN DISPOSABLE) ×3
GOWN STRL REUS W/TWL XL LVL3 (GOWN DISPOSABLE) ×1
HEMOVAC 400ML (MISCELLANEOUS)
K-WIRE ACE 1.6X6 (WIRE) ×4
KIT DRAIN HEMOVAC JP 7FR 400ML (MISCELLANEOUS) IMPLANT
KWIRE ACE 1.6X6 (WIRE) ×2 IMPLANT
LABEL OR SOLS (LABEL) IMPLANT
NS IRRIG 1000ML POUR BTL (IV SOLUTION) ×2 IMPLANT
PACK EXTREMITY ARMC (MISCELLANEOUS) ×2 IMPLANT
PAD ABD DERMACEA PRESS 5X9 (GAUZE/BANDAGES/DRESSINGS) ×4 IMPLANT
PAD CAST CTTN 4X4 STRL (SOFTGOODS) ×5 IMPLANT
PAD GROUND ADULT SPLIT (MISCELLANEOUS) ×2 IMPLANT
PAD PREP 24X41 OB/GYN DISP (PERSONAL CARE ITEMS) ×2 IMPLANT
PADDING CAST COTTON 4X4 STRL (SOFTGOODS) ×5
PLATE 9H 184 LT MED DIST TIB (Plate) ×2 IMPLANT
SCREW ACE CAN 4.0 38M (Screw) ×2 IMPLANT
SCREW ACE CAN 4.0 40M (Screw) ×2 IMPLANT
SCREW CORTICAL 3.5MM  20MM (Screw) ×1 IMPLANT
SCREW CORTICAL 3.5MM  30MM (Screw) ×1 IMPLANT
SCREW CORTICAL 3.5MM  34MM (Screw) ×1 IMPLANT
SCREW CORTICAL 3.5MM 20MM (Screw) ×1 IMPLANT
SCREW CORTICAL 3.5MM 22MM (Screw) ×4 IMPLANT
SCREW CORTICAL 3.5MM 30MM (Screw) ×1 IMPLANT
SCREW CORTICAL 3.5MM 34MM (Screw) ×1 IMPLANT
SCREW CORTICAL 3.5MM 36MM (Screw) IMPLANT
SCREW CORTICAL 3.5X46MM (Screw) IMPLANT
SCREW LOCK CORT STAR 3.5X24 (Screw) ×2 IMPLANT
SCREW LOCK CORT STAR 3.5X30 (Screw) ×4 IMPLANT
SCREW LOCK CORT STAR 3.5X34 (Screw) ×2 IMPLANT
SCREW LOCK CORT STAR 3.5X40 (Screw) ×2 IMPLANT
SCREW LOCK CORT STAR 3.5X44 (Screw) ×2 IMPLANT
SCREW NL LP 36X3.5 (Screw) ×2 IMPLANT
SPONGE LAP 18X18 5 PK (GAUZE/BANDAGES/DRESSINGS) ×2 IMPLANT
STAPLER SKIN PROX 35W (STAPLE) ×2 IMPLANT
STOCKINETTE M/LG 89821 (MISCELLANEOUS) ×2 IMPLANT
STOCKINETTE STRL 6IN 960660 (GAUZE/BANDAGES/DRESSINGS) IMPLANT
STRAP SAFETY BODY (MISCELLANEOUS) ×2 IMPLANT
SUT PROLENE 4 0 PS 2 18 (SUTURE) IMPLANT
SUT VIC AB 0 CT1 36 (SUTURE) IMPLANT
SUT VIC AB 2-0 CT1 (SUTURE) ×6 IMPLANT
SUT VIC AB 2-0 SH 27 (SUTURE)
SUT VIC AB 2-0 SH 27XBRD (SUTURE) IMPLANT
SYRINGE 10CC LL (SYRINGE) IMPLANT

## 2015-01-31 NOTE — H&P (Signed)
Subjective:  Chief complaint:  Left lower leg pain.  The patient is a 54 y.o. female who sustained an injury to the left lower leg yesterday afternoon. Apparently, she was about 6-7 feet up on a ladder cleaning some external windows when the ladder apparently slipped and she fell down, landing on her left lower leg. She was brought to the emergency room where x-rays demonstrated an oblique fracture of the distal tibia with probable intra-articular extension, as well as an impacted fibular neck fracture. The patient denies any associated injury or loss of consciousness associated with the injury, and denies any light-headedness, loss of consciousness, chest pain, or shortness of breath which might have contributed to the injury. However, she does note some soreness in the right knee where there is an abrasion. The patient denies any prior problems with either lower extremity, other than having sustained a DVT in the distant past. She denies any numbness or paresthesias to either lower extremity.  Patient Active Problem List   Diagnosis Date Noted  . Closed left tibial fracture 01/30/2015  . Fibrocystic disease of both breasts 09/21/2014  . Routine general medical examination at a health care facility 07/10/2013  . Fatigue 06/23/2012  . Hypothyroidism 10/19/2009  . VENOUS INSUFFICIENCY 10/19/2009   Past Medical History  Diagnosis Date  . Hypothyroidism   . DVT (deep venous thrombosis) ([place]) 2003    With BCP's  . Venous insufficiency   . Diffuse cystic mastopathy   . Asymptomatic varicose veins     Past Surgical History  Procedure Laterality Date  . Vein procedure      Dr. Jamal Collin  . Dilation and curettage of uterus  ~2003  . Tubal ligation  2005  . Vein closure Left 2008    Left Leg    Prescriptions prior to admission  Medication Sig Dispense Refill Last Dose  . aspirin 81 MG tablet Take 81 mg by mouth daily.   Taking  . SYNTHROID 112 MCG tablet TAKE ONE (1) TABLET EACH DAY 90 tablet  3   . omeprazole (PRILOSEC) 20 MG capsule Take 1 capsule (20 mg total) by mouth daily as needed. 1 capsule 0 Taking   No Known Allergies  Social History  Substance Use Topics  . Smoking status: Never Smoker   . Smokeless tobacco: Never Used  . Alcohol Use: No    Family History  Problem Relation Age of Onset  . Heart disease Mother     CHF  . Diabetes Mother   . Hypothyroidism Mother   . Cancer Maternal Aunt     Breast and Ovarian  (Believes BRCA positive)  . Cancer Other     Breast and ovarian CA (Believes BRCA positive)  . Cancer Maternal Aunt     Melanoma     Review of Systems: As noted above. The patient denies any chest pain, shortness of breath, nausea, vomiting, diarrhea, constipation, belly pain, blood in his/her stool, or burning with urination.  Objective: Temp:  [97.5 F (36.4 C)-98.8 F (37.1 C)] 98.5 F (36.9 C) (11/28 0730) Pulse Rate:  [80-94] 92 (11/28 0730) Resp:  [16-19] 18 (11/28 0730) BP: (100-154)/(47-76) 120/68 mmHg (11/28 0730) SpO2:  [95 %-100 %] 95 % (11/28 0730) Weight:  [79.833 kg (176 lb)] 79.833 kg (176 lb) (11/27 1555)  Physical Exam: General:  Alert, no acute distress Psychiatric:  Patient is competent for consent with normal mood and affect  Cardiovascular:  RRR, no murmurs  Respiratory:  Clear to auscultation. No wheezing. Non-labored  breathing GI:  Abdomen is soft and non-tender Skin:  No lesions in the area of chief complaint Neurologic:  Sensation intact distally Lymphatic:  No axillary or cervical lymphadenopathy  Orthopedic Exam:  Orthopedic examination is limited to the left foot and lower leg. There is a posterior splint with a sugar tong supplement on the left lower leg. Her skin is intact at the proximal and distal margins of the splint. She is able to actively dorsiflex and plantarflex her toes without discomfort. She has intact sensation to light touch to all toes, as well as to the first webspace. She has good capillary  refill to all toes. Examination of the left knee demonstrates full range of motion without tenderness or effusion. Skin inspection is unremarkable. She has no medial or lateral joint line tenderness, and no ligamentous laxity.  Examination of the right knee demonstrates a 2.5 cm diameter superficial abrasion over the patella with mild tenderness to palpation around this area. However, she is able to actively flex and extend her knee from 0 to beyond 100 without difficulty. She has no medial or lateral joint line tenderness, and no effusion. She is able to perform straight leg raise.  Imaging Review: Recent x-rays of the left knee and left ankle are available for review.  These films demonstrate a comminuted primarily oblique fracture of the left distal tibia with probable intra-articular extension. There also is an impacted fibular neck fracture. The mortise itself appears to be well aligned, as does the fracture alignment in both the AP and lateral projections.  Assessment: Closed comminuted eft distal tibia fracture with proximal fibular fracture.  Plan: The treatment options, including both nonsurgical and surgical choices, have been discussed in detail with the patient and her husband. The patient would like to proceed with formal open reduction and internal fixation of the left distal tibia fracture. The risks of this procedure (including bleeding, infection, nerve and/or blood vessel injury, persistent or recurrent pain, loosening or failure of the components, malunion and/or nonunion, development of ankle arthritis, need for further surgery, blood clots, strokes, heart attacks or arrhythmias, pneumonia, etc.) and benefits were discussed in detail. The patient states her understanding and agrees to proceed. A formal written consent will be obtained by the nursing staff.

## 2015-01-31 NOTE — Op Note (Signed)
01/30/2015 - 01/31/2015  6:11 PM  Patient:   Taylor Frederick  Pre-Op Diagnosis:   Comminuted displaced fracture left distal tibia with intra-articular extension.  Post-Op Diagnosis:   Same.  Procedure:   Open reduction and internal fixation of left distal tibia fracture.  Surgeon:   Maryagnes Amos, MD  Assistant:   None  Anesthesia:   General LMA  Findings:   As above.  Complications:   None  EBL:   50 cc  Fluids:   1300 cc crystalloid  TT:   138 minutes at 300 mmHg  Drains:   None  Closure:   Staples  Implants:   Biomet 9-hole distal tibial locking plate plus two 4-0 cannulated screws.  Brief Clinical Note:   The patient is a 54 year old female who sustained the above-noted injury late yesterday afternoon when she lost her balance and fell approximate 6-7 feet from a ladder while trying to wash windows at her home. She was brought to the emergency room where x-rays demonstrated the above-noted injury. She was placed into a posterior splint and admitted for close observation in preparation for definitive management of her injury today.  Procedure:   The patient was brought into the operating room and lain in the supine position. After adequate general laryngal mask anesthesia was obtained, the patient's left foot and lower extremity was prepped with ChloraPrep solution before being draped sterilely. Preoperative antibiotics were administered. The limb was exsanguinated with an Esmarch and the thigh tourniquet inflated to 300 mmHg. The fracture was assessed fluoroscopically in AP and lateral projections. On the lateral projection, it was clear that there was a large posterior lateral malleolar fragment displaced sufficiently to require open reduction and internal fixation. An approximately 6-7 cm incision was made over the medial aspect of the ankle centered over the medial malleolus. The incision was carried down through the subcutaneous tissues to expose the medial  malleolus. Care was taken to identify and protect the saphenous nerve and vein which was retracted anteriorly. Dissection was carried out along the posterior aspect of the distal tibia in order to access and reduce the posterior malleolar fragment. This was reduced using an elevator. A 2 cm incision was made longitudinally over the anterolateral aspect of the distal tibia. The incision was carried down through the subcutaneous tissues using blunt dissection to expose the anterior aspect of the distal tibia. Two anterior to posteriorly directed screws were placed in lag fashion to stabilize the posterior lateral fragment. Assessment of the reduction in AP and lateral projections demonstrated an anatomic reduction.  Next, sufficient blunt dissection was carried out along the medial aspect of the distal tibia to permit the long nine-hole plate to be passed along the medial aspect of the tibia. After several attempts, a reasonable approximation was achieved. An approximately 6-7 cm incision was made over the medial aspect of the tibial shaft at the level of the proximal portion of the plate. This incision was carried down through the subcutaneous tissues to expose the medial tibia. The plate position was optimized under fluoroscopic visualization in both AP and lateral projections and temporary secured using K-wires. Again the adequacy of fracture reduction was verified fluoroscopically in AP and lateral projections and found to be satisfactory. In addition, the overall alignment of the fracture was felt to be satisfactory as well. The plate was temporarily secured proximally using a bicortical screw and one of the slotted holes in order to fine-tune placement of the plate distally. The distal portion of  the plate was secured using six locking screws and 1 nonlocking screw. Two additional bicortical screws were placed through the plate more distally to help draw the shaft to the plate in order to optimize varus valgus  alignment. Two additional bicortical screws were placed proximally in order to complete fracture fixation. Again the construct was assessed fluoroscopically in AP and lateral projections and found to be excellent. The mortise remained well maintained and in good alignment in both the AP and lateral projections.  Each of the three wounds were irrigated copiously with sterile saline solution using bulb irrigation. The subcutaneous tissues were closed using 2-0 Vicryl interrupted sutures before the skin was closed using staples. A total of 30 cc of 0.5% Sensorcaine with epinephrine was injected in and around all wounds in order to help with postoperative analgesia before a sterile bulky dressing was applied to the wounds. The patient was placed into a posterior splint with a sugar tong supplement maintaining the ankle in neutral dorsiflexion before she was awakened, extubated, and returned to the recovery room in satisfactory condition after tolerating the procedure well.

## 2015-01-31 NOTE — Telephone Encounter (Signed)
[facility] Client TELEPHONE ADVICE RECORD [facility] Patient Name: Taylor SimmeringRHONDA Frederick Gender: Female DOB: [birth year] Age: [age] Y 11 M 4 D Return Phone Number: [phone] (Primary) Address: City/State/Zip: [place] [id number] Client [facility] Client Site [facility] Taylor Frederick - Night Physician Taylor [place], Taylor Contact Type Call Call Type Triage / Clinical Relationship To Patient Self Return Phone Number [phone] (Primary) Chief Complaint Leg Injury Initial Comment Caller states she fell and broke her leg, [facility] and they have her scheduled for surgery tomorrow. She is requesting that she speak with her doctor tonight. Nurse Assessment Nurse: Taylor BenesJohnson, RN, Taylor Frederick Date/Time Taylor Frederick(Eastern Time): 01/30/2015 7:25:06 PM Confirm and document reason for call. If symptomatic, describe symptoms. ---Nurse advised by Taylor Frederick she was wanting to ask Taylor Frederick [facility] in am Has the patient traveled out of the country within the last 30 days? ---No Does the patient have any new or worsening symptoms? ---No Please document clinical information provided and list any resource used. ---Nurse will check with on call. Guidelines Guideline Title Affirmed Question Affirmed Notes Nurse Date/Time (Eastern Time) Disp. Time Taylor Frederick(Eastern Time) Disposition Final User 01/30/2015 7:24:25 PM [facility] [place], [place], [place] 01/30/2015 7:38:06 PM Clinical Call Yes Taylor BenesJohnson, RN, Taylor Frederick After Care Instructions Given Call Event Type User Date / Time Description Paging [facility] Phone DateTime Result/Outcome Message Type Notes Taylor Frederick, Taylor Frederick [phone] 01/30/2015 7:24:25 PM Paged On Call Back to Call Center Doctor Paged please call Taylor Frederick [phone] PLEASE NOTE: All timestamps contained within this report  are represented as [place] Standard Time. CONFIDENTIALTY NOTICE: This fax transmission is intended only for the addressee. It contains information that is legally privileged, confidential or otherwise protected from use or disclosure. If you are not the intended recipient, you are strictly prohibited from reviewing, disclosing, copying using or disseminating any of this information or taking any action in reliance on or regarding this information. If you have received this fax in error, please notify us immediately by telephone so that we can arrange for its return to us. Phone: [phone], Toll-Free: [phone], Fax: [phone] Page: 2 of 2 Call Id: [id number] Paging DoctorName Phone DateTime Result/Outcome Message Type Notes Taylor Frederick, Taylor Frederick 01/30/2015 7:37:45 PM Spoke with On Call - Outcome Notification Message Result She states she would have no idea what Dr. 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 Taylor Frederick notified and given information provided by Dr. Selena BattenKim

## 2015-01-31 NOTE — Telephone Encounter (Signed)
Discussed with her She is due to have surgery later today Will follow up with her progress

## 2015-01-31 NOTE — Anesthesia Preprocedure Evaluation (Addendum)
Anesthesia Evaluation  Patient identified by MRN, date of birth, ID band Patient awake    Reviewed: Allergy & Precautions, H&P , NPO status , Patient's Chart, lab work & pertinent test results, reviewed documented beta blocker date and time   Airway Mallampati: II  TM Distance: >3 FB Neck ROM: full    Dental  (+) Teeth Intact   Pulmonary neg pulmonary ROS,    Pulmonary exam normal        Cardiovascular Exercise Tolerance: Good + Peripheral Vascular Disease  negative cardio ROS Normal cardiovascular exam Rate:Normal     Neuro/Psych negative neurological ROS  negative psych ROS   GI/Hepatic negative GI ROS, Neg liver ROS,   Endo/Other  negative endocrine ROSHypothyroidism   Renal/GU negative Renal ROS  negative genitourinary   Musculoskeletal   Abdominal   Peds  Hematology negative hematology ROS (+)   Anesthesia Other Findings   Reproductive/Obstetrics negative OB ROS                             Anesthesia Physical Anesthesia Plan  ASA: II and emergent  Anesthesia Plan: General LMA and Spinal   Post-op Pain Management:    Induction:   Airway Management Planned:   Additional Equipment:   Intra-op Plan:   Post-operative Plan:   Informed Consent: I have reviewed the patients History and Physical, chart, labs and discussed the procedure including the risks, benefits and alternatives for the proposed anesthesia with the patient or authorized representative who has indicated his/her understanding and acceptance.     Plan Discussed with: CRNA  Anesthesia Plan Comments:         Anesthesia Quick Evaluation

## 2015-01-31 NOTE — Care Management Note (Signed)
Case Management Note  Patient Details  Name: Taylor Frederick MRN: [id number] Date of Birth: Jul 09, 1960  Subjective/Objective:                  Met with patient and her mother to discuss discharge planning. She is from home with her husband. She was independent with mobility prior to this fall. She is pending surgery today. She has no DME at home. She uses [facility] for Rx.  Action/Plan: List of home health agencies left with patient. RNCM will continue to follow PT recommendations.   Expected Discharge Date:                  Expected Discharge Plan:     In-House Referral:     Discharge planning Services  CM Consult  Post Acute Care Choice:  Durable Medical Equipment, Home Health Choice offered to:  Patient  DME Arranged:    DME Agency:     HH Arranged:    [place] Agency:     Status of Service:  In process, will continue to follow  Medicare Important Message Given:    Date Medicare IM Given:    Medicare IM give by:    Date Additional Medicare IM Given:    Additional Medicare Important Message give by:     If discussed at [place] of Stay Meetings, dates discussed:    Additional Comments:  Marshell Garfinkel, RN 01/31/2015, 11:39 AM

## 2015-01-31 NOTE — Transfer of Care (Signed)
Immediate Anesthesia Transfer of Care Note  Patient: Taylor Frederick  Procedure(s) Performed: Procedure(s): OPEN REDUCTION INTERNAL FIXATION (ORIF) DISTAL TIBIA  FRACTURE (Left)  Patient Location: PACU  Anesthesia Type:General  Level of Consciousness: sedated  Airway & Oxygen Therapy: Patient Spontanous Breathing and Patient connected to face mask oxygen  Post-op Assessment: Report given to RN and Post -op Vital signs reviewed and stable  Post vital signs: stable  Last Vitals:  Filed Vitals:   01/31/15 1421 01/31/15 1810  BP: 134/83 118/65  Pulse: 104   Temp: 37.6 C 36.4 C  Resp: 18 18    Complications: No apparent anesthesia complications

## 2015-01-31 NOTE — Anesthesia Postprocedure Evaluation (Signed)
Anesthesia Post Note  Patient: Taylor Frederick  Procedure(s) Performed: Procedure(s) (LRB): OPEN REDUCTION INTERNAL FIXATION (ORIF) DISTAL TIBIA  FRACTURE (Left)  Patient location during evaluation: PACU Anesthesia Type: General Level of consciousness: awake, awake and alert and oriented Pain management: pain level controlled Vital Signs Assessment: post-procedure vital signs reviewed and stable Respiratory status: spontaneous breathing Cardiovascular status: blood pressure returned to baseline Anesthetic complications: no    Last Vitals:  Filed Vitals:   01/31/15 2135 01/31/15 2215  BP: 128/60 125/64  Pulse: 109 89  Temp: 36.7 C 36.8 C  Resp: 18 18    Last Pain:  Filed Vitals:   01/31/15 2216  PainSc: 2                  Ellison Rieth

## 2015-01-31 NOTE — Anesthesia Procedure Notes (Signed)
Procedure Name: LMA Insertion Date/Time: 04/19/2014 3:13 PM Performed by: Ginger CarneMICHELET, Bronnie Vasseur Pre-anesthesia Checklist: Patient identified, Emergency Drugs available, Suction available, Patient being monitored and Timeout performed Patient Re-evaluated:Patient Re-evaluated prior to inductionOxygen Delivery Method: Circle system utilized Preoxygenation: Pre-oxygenation with 100% oxygen Intubation Type: IV induction LMA: LMA inserted LMA Size: 3.5 Tube type: Oral Number of attempts: 1 Placement Confirmation: positive ETCO2 Tube secured with: Tape Dental Injury: Teeth and Oropharynx as per pre-operative assessment

## 2015-02-01 ENCOUNTER — Encounter: Payer: Self-pay | Admitting: Surgery

## 2015-02-01 LAB — BASIC METABOLIC PANEL
ANION GAP: 6 (ref 5–15)
BUN: 7 mg/dL (ref 6–20)
CALCIUM: 8.6 mg/dL — AB (ref 8.9–10.3)
CHLORIDE: 105 mmol/L (ref 101–111)
CO2: 26 mmol/L (ref 22–32)
Creatinine, Ser: 0.67 mg/dL (ref 0.44–1.00)
GFR calc non Af Amer: >60 mL/min (ref >60)
Glucose, Bld: 137 mg/dL — ABNORMAL HIGH (ref 65–99)
Potassium: 4.3 mmol/L (ref 3.5–5.1)
SODIUM: 137 mmol/L (ref 135–145)

## 2015-02-01 NOTE — Progress Notes (Signed)
Patient complaining on numbness and tingling in left lower leg from the knee to the toes. Dr Joice LoftsPoggi notified, states it is from the torniquette and that this will wear off within 2-3 days. Continue to monitor.

## 2015-02-01 NOTE — Evaluation (Signed)
Physical Therapy Evaluation Patient Details Name: Taylor Frederick MRN: [id number] DOB: 1960-12-11 Today's Date: 02/01/2015   History of Present Illness  Pt is a 54 y.o. female s/p fall off ladder sustaining closed comminuted L distal tibia fx with proximal fibula fx.  Pt s/p 02/01/15 ORIF of L distal tib fx.  PMH includes DVT.  Clinical Impression  Prior to admission, pt was independent.  Pt lives with her husband in 1 level home with 3-4 steps to enter with railing.  Currently pt is CGA to min assist with bed mobility, transfers, and gait (20 feet) with RW.  Pt NWB'ing L LE and did well maintaining this with functional mobility  Pt would benefit from skilled PT to address noted impairments and functional limitations.  Recommend pt discharge to home when medically appropriate (pt reports her family can build her a ramp in next day so she can avoid stairs to access home) with use of RW and HHPT pending pt's progress.     Follow Up Recommendations  (HHPT pending pt's progress)    Equipment Recommendations  Rolling walker with 5" wheels    Recommendations for Other Services       Precautions / Restrictions Precautions Precautions: Fall Restrictions Weight Bearing Restrictions: Yes LLE Weight Bearing: Non weight bearing      Mobility  Bed Mobility Overal bed mobility: Needs Assistance Bed Mobility: Supine to Sit     Supine to sit: Min assist     General bed mobility comments: assist for L LE  Transfers Overall transfer level: Needs assistance Equipment used: Rolling walker (2 wheeled) Transfers: Sit to/from Stand Sit to Stand: Min assist         General transfer comment: vc's and demo required for correct technique (hand, foot placement)  Ambulation/Gait Ambulation/Gait assistance: Min guard;Min assist Ambulation Distance (Feet): 20 Feet Assistive device: Rolling walker (2 wheeled)   Gait velocity: decreased   General Gait Details: NWB L LE; initial vc's and  demo required for gait technique and walker use but improved with practice  Stairs            Wheelchair Mobility    Modified Rankin (Stroke Patients Only)       Balance Overall balance assessment: Needs assistance Sitting-balance support: No upper extremity supported;Feet supported Sitting balance-Leahy Scale: Normal     Standing balance support: Bilateral upper extremity supported (on RW) Standing balance-Leahy Scale: Fair                               Pertinent Vitals/Pain Pain Assessment: 0-10 Pain Score: 4  Pain Location: L ankle Pain Descriptors / Indicators: Sore;Numbness;Tender Pain Intervention(s): Limited activity within patient's tolerance;Monitored during session;Premedicated before session;Repositioned  Vitals stable and WFL throughout treatment session.    Home Living Family/patient expects to be discharged to:: Private residence Living Arrangements: Spouse/significant other Available Help at Discharge: Family Type of Home: House Home Access: Stairs to enter Entrance Stairs-Rails: Doctor, general practiceight;Left Entrance Stairs-Number of Steps: 3-4 Home Layout: One level Home Equipment: None      Prior Function Level of Independence: Independent         Comments: Works full time in Teacher, adult educationHR/office manager     Hand Dominance        Extremity/Trunk Assessment   Upper Extremity Assessment: Overall WFL for tasks assessed           Lower Extremity Assessment: RLE deficits/detail;LLE deficits/detail RLE Deficits / Details: R LE  WFL ROM and strength LLE Deficits / Details: L hip flexion at least 3+/5; L knee flexion/extension at least 3-/5 AROM  Cervical / Trunk Assessment: Normal  Communication   Communication: No difficulties  Cognition Arousal/Alertness: Awake/alert Behavior During Therapy: WFL for tasks assessed/performed Overall Cognitive Status: Within Functional Limits for tasks assessed                      General Comments  General comments (skin integrity, edema, etc.): L LE splint in place  Nursing cleared pt for participation in physical therapy.  Pt agreeable to PT session.    Exercises   Performed semi-supine LE therapeutic exercise x 10 reps:  Ankle pumps (AROM R LE); quad sets x3 second holds (AROM R LE); glute squeezes x3 second holds (AROM B); heelslides (AROM R; AAROM L), hip abd/adduction (AROM R; AAROM L).  Pt required vc's and tactile cues for correct technique with exercises.      Assessment/Plan    PT Assessment Patient needs continued PT services  PT Diagnosis Difficulty walking;Acute pain   PT Problem List Decreased balance;Decreased mobility;Decreased knowledge of use of DME;Decreased knowledge of precautions;Pain  PT Treatment Interventions DME instruction;Gait training;Stair training;Functional mobility training;Therapeutic activities;Therapeutic exercise;Balance training;Neuromuscular re-education;Patient/family education   PT Goals (Current goals can be found in the Care Plan section) Acute Rehab PT Goals Patient Stated Goal: to go home PT Goal Formulation: With patient Time For Goal Achievement: 02/15/15 Potential to Achieve Goals: Good    Frequency BID   Barriers to discharge        Co-evaluation               End of Session Equipment Utilized During Treatment: Gait belt Activity Tolerance: Patient limited by pain Patient left: in chair;with call bell/phone within reach;with chair alarm set;with SCD's reapplied (L LE elevated on 3 pillows and pt reclined) Nurse Communication: Mobility status;Precautions;Weight bearing status         Time: 1610-9604 PT Time Calculation (min) (ACUTE ONLY): 42 min   Charges:   PT Evaluation $Initial PT Evaluation Tier I: 1 Procedure PT Treatments $Therapeutic Exercise: 8-22 mins   PT G CodesHendricks Limes 2015/02/07, 12:56 PM Hendricks Limes, PT [phone]

## 2015-02-01 NOTE — Progress Notes (Signed)
  Subjective: 1 Day Post-Op Procedure(s) (LRB): OPEN REDUCTION INTERNAL FIXATION (ORIF) DISTAL TIBIA  FRACTURE (Left) Patient reports pain as 6 on 0-10 scale, but is complaining of numbness in the left foot. Patient is well, and has had no acute complaints or problems Plan is to go Home after hospital stay. Negative for chest pain and shortness of breath Fever: no Gastrointestinal:Negative for nausea and vomiting  Objective: Vital signs in last 24 hours: Temp:  [97.6 F (36.4 C)-99.7 F (37.6 C)] 98.7 F (37.1 C) (11/29 0738) Pulse Rate:  [78-109] 78 (11/29 0738) Resp:  [14-18] 18 (11/29 0738) BP: (112-148)/(59-83) 112/59 mmHg (11/29 0738) SpO2:  [96 %-98 %] 98 % (11/29 0738) FiO2 (%):  [28 %] 28 % (11/28 1938)  Intake/Output from previous day:  Intake/Output Summary (Last 24 hours) at 02/01/15 1024 Last data filed at 02/01/15 0800  Gross per 24 hour  Intake   1640 ml  Output    975 ml  Net    665 ml    Intake/Output this shift: Total I/O In: 240 [P.O.:240] Out: 0   Labs:  Recent Labs  01/30/15 1734  HGB 13.3    Recent Labs  01/30/15 1734  WBC 17.5*  RBC 4.80  HCT 40.4  PLT 419    Recent Labs  01/30/15 1734 02/01/15 0734  NA 137 137  K 3.8 4.3  CL 103 105  CO2 27 26  BUN 13 7  CREATININE 0.84 0.67  GLUCOSE 118* 137*  CALCIUM 9.1 8.6*   No results for input(s): LABPT, INR in the last 72 hours.   EXAM General - Patient is Alert, Appropriate and Oriented Extremity - ABD soft Incision: dressing C/D/I Dressing/Incision - clean, dry, no drainage present in the post-op splint Motor Function - intact, moving foot and toes well on exam.   Pt does complain of some numbness in the left lower extremity, likely due to torniquette time as well as numbing medication injected at the surgical site. Pt is able to wiggle her toes in the post op splint and has sensation to the deep perineal nerve.  Past Medical History  Diagnosis Date  . Hypothyroidism    . DVT (deep venous thrombosis) (HCC) 2003    With BCP's  . Venous insufficiency   . Diffuse cystic mastopathy   . Asymptomatic varicose veins     Assessment/Plan: 1 Day Post-Op Procedure(s) (LRB): OPEN REDUCTION INTERNAL FIXATION (ORIF) DISTAL TIBIA  FRACTURE (Left) Active Problems:   Closed left tibial fracture  Estimated body mass index is 31.18 kg/(m^2) as calculated from the following:   Height as of this encounter: 5\' 3"  (1.6 m).   Weight as of this encounter: 79.833 kg (176 lb). Advance diet Up with therapy D/C IV fluids when tolerating PO intake  Pt is concerned over numbness in the left lower extremity.  Both Dr. Joice LoftsPoggi and myself have talked with the patient about this being due to numbing medication and the torniquet time during the procedure.  Informed that this will likely last for 2-3 days before the numbness and tingling improve.  Continue with PT, plan on discharge home tomorrow if patient is stable.  DVT Prophylaxis - Lovenox, Foot Pumps and TED hose Non Weight-Bearing as tolerated to left leg  J. Horris LatinoLance McGhee, PA-C [facility] 02/01/2015, 10:24 AM

## 2015-02-01 NOTE — Progress Notes (Signed)
Clinical Social Worker (CSW) received SNF consult. PT is recommending home health. RN Case Manager is aware of above. Please reconsult if future social work needs arise. CSW signing off.   Jaslyn Bansal Morgan, LCSWA [phone] 

## 2015-02-01 NOTE — Progress Notes (Signed)
Physical Therapy Treatment Patient Details Name: Taylor Frederick MRN: [id number] DOB: Oct 27, 1960 Today's Date: 02/01/2015    History of Present Illness Pt is a 54 y.o. female s/p fall off ladder sustaining closed comminuted L distal tibia fx with proximal fibula fx.  Pt s/p 02/01/15 ORIF of L distal tib fx.  PMH includes DVT.    PT Comments    Pt able to progress ambulation distance to 100 feet with RW and CGA but distance limited d/t R LE fatigue and 6/10 L ankle pain.  Pt did well maintaining NWB'ing status.  Pt's family currently building pt a ramp to enter home on discharge (pt reports not feeling like she is going to be able to perform stairs safely with NWB'ing status and not feeling like her R LE was strong enough).  Will continue to progress pt towards independent functional mobility per pt tolerance.   Follow Up Recommendations   (HHPT pending pt's progress)     Equipment Recommendations  Rolling walker with 5" wheels    Recommendations for Other Services       Precautions / Restrictions Precautions Precautions: Fall Restrictions Weight Bearing Restrictions: Yes LLE Weight Bearing: Non weight bearing    Mobility  Bed Mobility         General bed mobility comments: deferred d/t pt wanting to stay up in chair  Transfers Overall transfer level: Needs assistance Equipment used: Rolling walker (2 wheeled) Transfers: Sit to/from Stand Sit to Stand: Min guard         General transfer comment: occasional vc's and demo required for correct technique (hand, foot placement)  Ambulation/Gait Ambulation/Gait assistance: Min guard;+2 safety/equipment (chair follow) Ambulation Distance (Feet): 100 Feet Assistive device: Rolling walker (2 wheeled)   Gait velocity: decreased   General Gait Details: NWB L LE; occasional vc's and demo required for gait technique and walker use; limited distance d/t fatigue   Stairs            Wheelchair Mobility     Modified Rankin (Stroke Patients Only)       Balance Overall balance assessment: Needs assistance Sitting-balance support: No upper extremity supported;Feet unsupported Sitting balance-Leahy Scale: Normal     Standing balance support: Bilateral upper extremity supported (on RW) Standing balance-Leahy Scale: Good                      Cognition Arousal/Alertness: Awake/alert Behavior During Therapy: WFL for tasks assessed/performed Overall Cognitive Status: Within Functional Limits for tasks assessed                      Exercises   Performed semi-supine LE therapeutic exercise x 10 reps:  Ankle pumps (AROM R LE); quad sets x3 second holds (AROM B LE's); glute squeezes x3 second holds (AROM B); SAQ's (AROM R; AAROM L); heelslides (AROM R; AAROM L), hip abd/adduction (AROM R; AAROM L), and SLR (AAROM L).  Pt required vc's and tactile cues for correct technique with exercises.     General Comments General comments (skin integrity, edema, etc.): L LE splint in place  Pt agreeable to PT session.     Pertinent Vitals/Pain Pain Assessment: 0-10 Pain Score:  (4/10 at rest; 6/10 with activity) Pain Location: L ankle Pain Descriptors / Indicators: Sore;Numbness;Tender Pain Intervention(s): Limited activity within patient's tolerance;Monitored during session;Repositioned    Home Living Family/patient expects to be discharged to:: Private residence Living Arrangements: Spouse/significant other Available Help at Discharge: Family Type of Home: House  Home Access: Stairs to enter Entrance Stairs-Rails: Right;Left Home Layout: One level Home Equipment: None      Prior Function Level of Independence: Independent      Comments: Works full time in Horticulturist, commercialHR/office manager   PT Goals (current goals can now be found in the care plan section) Acute Rehab PT Goals Patient Stated Goal: to go home PT Goal Formulation: With patient Time For Goal Achievement:  02/15/15 Potential to Achieve Goals: Good Progress towards PT goals: Progressing toward goals    Frequency  BID    PT Plan Current plan remains appropriate    Co-evaluation             End of Session Equipment Utilized During Treatment: Gait belt Activity Tolerance: Patient limited by pain;Patient limited by fatigue Patient left: in chair;with call bell/phone within reach;with chair alarm set;with SCD's reapplied (L LE elevated on 3 pillows and chair reclined)     Time: [id number] PT Time Calculation (min) (ACUTE ONLY): 40 min  Charges:  $Gait Training: 8-22 mins $Therapeutic Exercise: 8-22 mins $Therapeutic Activity: 8-22 mins                    G CodesHendricks Limes:      Salvatore Shear 02/01/2015, 4:34 PM Hendricks LimesEmily Mionna Advincula, PT [phone]

## 2015-02-02 LAB — CBC
HEMATOCRIT: 35.2 % (ref 35.0–47.0)
HEMOGLOBIN: 11.6 g/dL — AB (ref 12.0–16.0)
MCH: 28.2 pg (ref 26.0–34.0)
MCHC: 32.9 g/dL (ref 32.0–36.0)
MCV: 85.7 fL (ref 80.0–100.0)
PLATELETS: 341 10*3/uL (ref 150–440)
RBC: 4.11 MIL/uL (ref 3.80–5.20)
RDW: 13 % (ref 11.5–14.5)
WBC: 11.5 10*3/uL — ABNORMAL HIGH (ref 3.6–11.0)

## 2015-02-02 LAB — BASIC METABOLIC PANEL
ANION GAP: 6 (ref 5–15)
BUN: 11 mg/dL (ref 6–20)
CHLORIDE: 102 mmol/L (ref 101–111)
CO2: 29 mmol/L (ref 22–32)
Calcium: 8.6 mg/dL — ABNORMAL LOW (ref 8.9–10.3)
Creatinine, Ser: 0.75 mg/dL (ref 0.44–1.00)
GFR calc non Af Amer: >60 mL/min (ref >60)
GLUCOSE: 128 mg/dL — AB (ref 65–99)
POTASSIUM: 3.7 mmol/L (ref 3.5–5.1)
Sodium: 137 mmol/L (ref 135–145)

## 2015-02-02 MED ORDER — ENOXAPARIN SODIUM 30 MG/0.3ML ~~LOC~~ SOLN: 30.0000 mg | Freq: Two times a day (BID) | SUBCUTANEOUS | Status: DC

## 2015-02-02 MED ORDER — MENTHOL 3 MG MT LOZG: 1.0000 | LOZENGE | OROMUCOSAL | Status: DC | PRN

## 2015-02-02 MED ORDER — OXYCODONE HCL 5 MG PO TABS: 5.0000 mg | ORAL_TABLET | ORAL | Status: DC | PRN

## 2015-02-02 NOTE — Progress Notes (Signed)
  Subjective: 2 Days Post-Op Procedure(s) (LRB): OPEN REDUCTION INTERNAL FIXATION (ORIF) DISTAL TIBIA  FRACTURE (Left) Patient reports pain as mild.   Patient is well, but has had some minor complaints of continued numbness in her left foot Plan is to go home with homehealth PT after hospital stay. Negative for chest pain and shortness of breath Fever: no Gastrointestinal:Negative for nausea and vomiting  Objective: Vital signs in last 24 hours: Temp:  [98.3 F (36.8 C)-98.6 F (37 C)] 98.4 F (36.9 C) (11/30 0743) Pulse Rate:  [79-92] 92 (11/30 0743) Resp:  [18-20] 20 (11/30 0743) BP: (122-134)/(65-78) 131/74 mmHg (11/30 0743) SpO2:  [96 %-99 %] 96 % (11/30 0743)  Intake/Output from previous day:  Intake/Output Summary (Last 24 hours) at 02/02/15 0757 Last data filed at 02/02/15 0752  Gross per 24 hour  Intake    720 ml  Output      0 ml  Net    720 ml    Intake/Output this shift:    Labs:  Recent Labs  01/30/15 1734  HGB 13.3    Recent Labs  01/30/15 1734  WBC 17.5*  RBC 4.80  HCT 40.4  PLT 419    Recent Labs  01/30/15 1734 02/01/15 0734  NA 137 137  K 3.8 4.3  CL 103 105  CO2 27 26  BUN 13 7  CREATININE 0.84 0.67  GLUCOSE 118* 137*  CALCIUM 9.1 8.6*   No results for input(s): LABPT, INR in the last 72 hours.   EXAM General - Patient is Alert, Appropriate and Oriented Extremity - ABD soft Dorsiflexion/Plantar flexion intact No cellulitis present  No drainage noted at the post-op splint Dressing/Incision - clean, dry, no drainage Motor Function - intact, moving foot and toes well on exam.   Abdomen soft with normal BS.  Past Medical History  Diagnosis Date  . Hypothyroidism   . DVT (deep venous thrombosis) (HCC) 2003    With BCP's  . Venous insufficiency   . Diffuse cystic mastopathy   . Asymptomatic varicose veins     Assessment/Plan: 2 Days Post-Op Procedure(s) (LRB): OPEN REDUCTION INTERNAL FIXATION (ORIF) DISTAL TIBIA   FRACTURE (Left) Active Problems:   Closed left tibial fracture  Estimated body mass index is 31.18 kg/(m^2) as calculated from the following:   Height as of this encounter: 5\' 3"  (1.6 m).   Weight as of this encounter: 79.833 kg (176 lb). Advance diet Up with therapy   Pt still having numbness and tingling in the left lower extremity, but this is improving.  Pt has sensation in the dorsal first web space, along the volar and dorsal aspect of her toes.  Pt able to flex and extend toes on command. Pt has history of DVT, will continue lovenox at discharge. Will work with PT today, pt does have to ambulate stairs at home which will be difficult considering her non-weight bearing status. Plan on potential discharge later today or tomorrow depending on progress towards goals and safety at home.  DVT Prophylaxis - Aspirin, Lovenox and Foot Pumps Non Wight-Bearing as tolerated to left leg  J. Horris LatinoLance Adric Wrede, PA-C [facility] 02/02/2015, 7:57 AM

## 2015-02-02 NOTE — Progress Notes (Signed)
Physical Therapy Treatment Patient Details Name: Taylor Frederick MRN: [id number] DOB: 10-08-1960 Today's Date: 02/02/2015    History of Present Illness Pt is a 54 y.o. female s/p fall off ladder sustaining closed comminuted L distal tibia fx with proximal fibula fx.  Pt s/p 02/01/15 ORIF of L distal tib fx.  PMH includes DVT.    PT Comments    Pt able to ambulate 80 feet with RW SBA (distance limited d/t R LE fatigue); pt able to maintain NWB'ing L LE well.  Pt reports she feels that her R LE is too weak to try stairs and her family already built her a ramp to enter her home.  Discussed bedside commode but pt currently declining and reports if she needs one she can get one from family.  Pt with questions on w/c to use on ramp to enter home (CM notified who reported she would discuss w/c options with pt) but stated she could navigate it with RW and family assist if needed if she did not get a w/c.  Recommend HHPT, RW, and initial SBA for functional mobility for safety upon discharge home (pt reports having this available from family).   Follow Up Recommendations  Home health PT;Supervision for mobility/OOB     Equipment Recommendations  Rolling walker with 5" wheels    Recommendations for Other Services       Precautions / Restrictions Precautions Precautions: Fall Restrictions Weight Bearing Restrictions: Yes LLE Weight Bearing: Non weight bearing    Mobility  Bed Mobility Overal bed mobility: Independent             General bed mobility comments: Supine to/from sit independent with HOB flat  Transfers Overall transfer level: Needs assistance Equipment used: Rolling walker (2 wheeled) Transfers: Sit to/from [employer] Sit to Stand: Supervision Stand pivot transfers: Supervision (recliner to bed; bed to recliner)       General transfer comment: steady without loss of balance  Ambulation/Gait Ambulation/Gait assistance: Supervision Ambulation  Distance (Feet): 80 Feet Assistive device: Rolling walker (2 wheeled)   Gait velocity: decreased   General Gait Details: NWB L LE; steady without loss of balance; limited distance d/t R LE fatigue   Stairs            Wheelchair Mobility    Modified Rankin (Stroke Patients Only)       Balance   Sitting-balance support: No upper extremity supported;Feet unsupported Sitting balance-Leahy Scale: Normal     Standing balance support: Bilateral upper extremity supported (on RW) Standing balance-Leahy Scale: Good                      Cognition Arousal/Alertness: Awake/alert Behavior During Therapy: WFL for tasks assessed/performed Overall Cognitive Status: Within Functional Limits for tasks assessed                      Exercises   Performed semi-supine L LE therapeutic exercise x 10 reps:  quad sets x3 second holds (AROM B LE's); heelslides (AAROM L), hip abd/adduction (AROM L), and SLR (AROM L).  Pt required occasional vc's and tactile cues for correct technique with exercises.     General Comments General comments (skin integrity, edema, etc.): L LE splint in place  Nursing cleared pt for participation in physical therapy.  Pt agreeable to PT session.      Pertinent Vitals/Pain Pain Assessment: 0-10 Pain Score: 4  Pain Location: L ankle Pain Descriptors / Indicators: Sore;Tender;Numbness Pain  Intervention(s): Limited activity within patient's tolerance;Monitored during session;Repositioned    Home Living                      Prior Function            PT Goals (current goals can now be found in the care plan section) Acute Rehab PT Goals Patient Stated Goal: to go home PT Goal Formulation: With patient Time For Goal Achievement: 02/15/15 Potential to Achieve Goals: Good Progress towards PT goals: Progressing toward goals    Frequency  BID    PT Plan Current plan remains appropriate    Co-evaluation             End of  Session Equipment Utilized During Treatment: Gait belt Activity Tolerance: Patient limited by fatigue Patient left: in chair;with call bell/phone within reach;with chair alarm set;with family/visitor present (L LE elevated on 3 pillows)     Time: [id number] PT Time Calculation (min) (ACUTE ONLY): 40 min  Charges:  $Gait Training: 8-22 mins $Therapeutic Exercise: 8-22 mins $Therapeutic Activity: 8-22 mins                    G CodesHendricks Limes:      Ellarae Nevitt 02/02/2015, 11:14 AM Hendricks LimesEmily Dedria Endres, PT [phone]

## 2015-02-02 NOTE — Care Management (Signed)
Patient has chosen [facility] for home health PT.  Taylor Frederick from [facility] has been notified of referral.  PT has recommend RW.  Patient has also requested a standard wheelchair.  Up dated patient on quoted out of cost expense for WC.  Will notified of DME needs.  Will deliver equipment to room prior to discharge. RNCM signing off

## 2015-02-02 NOTE — Discharge Instructions (Signed)
NSTRUCTIONS AFTER Surgery  o Remove items at home which could result in a fall. This includes throw rugs or furniture in walking pathways o ICE to the affected joint every three hours while awake for 30 minutes at a time, for at least the first 3-5 days, and then as needed for pain and swelling.  Continue to use ice for pain and swelling. You may notice swelling that will progress down to the foot and ankle.  This is normal after surgery.  Elevate your leg when you are not up walking on it.   o Continue to use the breathing machine you got in the hospital (incentive spirometer) which will help keep your temperature down.  It is common for your temperature to cycle up and down following surgery, especially at night when you are not up moving around and exerting yourself.  The breathing machine keeps your lungs expanded and your temperature down.   DIET:  As you were doing prior to hospitalization, we recommend a well-balanced diet.  DRESSING / WOUND CARE / SHOWERING  Keep post-op splint in place, do not remove or get wet.  Will remove sutures and remove post-op splint at first follow-up appointment  ACTIVITY  o Increase activity slowly as tolerated, but follow the weight bearing instructions below.   o No driving for 6 weeks or until further direction given by your physician.  You cannot drive while taking narcotics.  o No lifting or carrying greater than 10 lbs. until further directed by your surgeon. o Avoid periods of inactivity such as sitting longer than an hour when not asleep. This helps prevent blood clots.  o You may return to work once you are authorized by your doctor.     WEIGHT BEARING   Non-weight bearing to the left lower extremity.   EXERCISES Per physical therapy.  CONSTIPATION  Constipation is defined medically as fewer than three stools per week and severe constipation as less than one stool per week.  Even if you have a regular bowel pattern at home, your normal  regimen is likely to be disrupted due to multiple reasons following surgery.  Combination of anesthesia, postoperative narcotics, change in appetite and fluid intake all can affect your bowels.   YOU MUST use at least one of the following options; they are listed in order of increasing strength to get the job done.  They are all available over the counter, and you may need to use some, POSSIBLY even all of these options:    Drink plenty of fluids (prune juice may be helpful) and high fiber foods Colace 100 mg by mouth twice a day  Senokot for constipation as directed and as needed Dulcolax (bisacodyl), take with full glass of water  Miralax (polyethylene glycol) once or twice a day as needed.  If you have tried all these things and are unable to have a bowel movement in the first 3-4 days after surgery call either your surgeon or your primary doctor.    If you experience loose stools or diarrhea, hold the medications until you stool forms back up.  If your symptoms do not get better within 1 week or if they get worse, check with your doctor.  If you experience "the worst abdominal pain ever" or develop nausea or vomiting, please contact the office immediately for further recommendations for treatment.   ITCHING:  If you experience itching with your medications, try taking only a single pain pill, or even half a pain pill at a  time.  You can also use Benadryl over the counter for itching or also to help with sleep.   MEDICATIONS:  See your medication summary on the After Visit Summary that nursing will review with you.  You may have some home medications which will be placed on hold until you complete the course of blood thinner medication.  It is important for you to complete the blood thinner medication as prescribed.  PRECAUTIONS:  If you experience chest pain or shortness of breath - call 911 immediately for transfer to the hospital emergency department.   If you develop a fever greater that  101 F, purulent drainage from wound, increased redness or drainage from wound, foul odor from the wound/dressing, or calf pain - CONTACT YOUR SURGEON.                                                   FOLLOW-UP APPOINTMENTS:  If you do not already have a post-op appointment, please call the office for an appointment to be seen by your surgeon.  Guidelines for how soon to be seen are listed in your After Visit Summary, but are typically between 1-4 weeks after surgery.  MAKE SURE YOU:   Understand these instructions.   Get help right away if you are not doing well or get worse.   Thank you for letting us be a part of your medical care team.  It is a privilege we respect greatly.  We hope these instructions will help you stay on track for a fast and full recovery!

## 2015-02-02 NOTE — Progress Notes (Signed)
Physical Therapy Treatment Patient Details Name: Taylor Frederick MRN: [id number] DOB: [birth year] Today's Date: 02/02/2015    History of Present Illness Pt is a 54 y.o. female s/p fall off ladder sustaining closed comminuted L distal tibia fx with proximal fibula fx.  Pt s/p 02/01/15 ORIF of L distal tib fx.  PMH includes DVT.    PT Comments    Pt reporting requesting w/c for discharge d/t being NWB'ing for next 6 weeks (assist for getting in/out of house via ramp and also to return to work).  W/C delivered during session and pt fitted for w/c (and RW) and pt educated on how to safely use both w/c (navigating w/c and safely using w/c parts) and RW:  Pt verbalizing and demonstrating good understanding.  Plan for pt to discharge home with use of RW for functional mobility household distances (with SBA initially for safety) and pt plans to use w/c to enter home via ramp her family just built (family will push w/c).  Recommend HHPT to improve balance and strength to increase independence with functional mobility.   Follow Up Recommendations  Home health PT;Supervision for mobility/OOB     Equipment Recommendations  Rolling walker with 5" wheels    Recommendations for Other Services       Precautions / Restrictions Precautions Precautions: Fall Restrictions Weight Bearing Restrictions: Yes LLE Weight Bearing: Non weight bearing    Mobility  Bed Mobility                  Transfers Overall transfer level: Needs assistance Equipment used: Rolling walker (2 wheeled) Transfers: Sit to/from Stand;Stand Pivot Transfers Sit to Stand: Supervision Stand pivot transfers: Supervision       General transfer comment: steady without loss of balance  Ambulation/Gait Ambulation/Gait assistance: Supervision Ambulation Distance (Feet):  (2x10 feet recliner to w/c and back) Assistive device: Rolling walker (2 wheeled)   Gait velocity: decreased   General Gait Details: NWB L LE;  steady without loss of balance   Administratortairs            Wheelchair Mobility Wheelchair Mobility Wheelchair mobility: Yes Wheelchair propulsion: Both upper extremities Wheelchair parts: Supervision/cueing Distance: 80 Wheelchair Assistance Details (indicate cue type and reason): pt fitted for w/c and educated on how to safely navigate w/c and correctly use w/c parts (including brakes, legrests, folding/unfolding w/c); pt requiring initial cueing for turning w/c R/L but quickly improved requiring no vc's  Modified Rankin (Stroke Patients Only)       Balance   Sitting-balance support: No upper extremity supported;Feet unsupported Sitting balance-Leahy Scale: Normal     Standing balance support: Bilateral upper extremity supported (on RW) Standing balance-Leahy Scale: Good                      Cognition Arousal/Alertness: Awake/alert Behavior During Therapy: WFL for tasks assessed/performed Overall Cognitive Status: Within Functional Limits for tasks assessed                      Exercises      General Comments General comments (skin integrity, edema, etc.): L LE splint in place  Nursing cleared pt for participation in physical therapy.  Pt agreeable to PT session and reports plan to discharge home today.      Pertinent Vitals/Pain Pain Assessment: 0-10 Pain Score: 4  Pain Location: L ankle Pain Descriptors / Indicators: Sore;Tender Pain Intervention(s): Limited activity within patient's tolerance;Monitored during session;Repositioned    Home Living  Prior Function            PT Goals (current goals can now be found in the care plan section) Acute Rehab PT Goals Patient Stated Goal: to go home PT Goal Formulation: With patient Time For Goal Achievement: 02/15/15 Potential to Achieve Goals: Good Progress towards PT goals: Progressing toward goals    Frequency  BID    PT Plan Current plan remains appropriate     Co-evaluation             End of Session Equipment Utilized During Treatment: Gait belt Activity Tolerance: Patient tolerated treatment well Patient left: in chair;with call bell/phone within reach;with chair alarm set (L LE elevated on 3 pillows)     Time: 1610-9604 PT Time Calculation (min) (ACUTE ONLY): 38 min  Charges:  $Therapeutic Activity: 23-37 mins $Wheel Chair Management: 8-22 mins                    G CodesHendricks Limes 02-18-15, 3:47 PM Hendricks Limes, PT [phone]

## 2015-02-02 NOTE — Discharge Summary (Signed)
Physician Discharge Summary  Patient ID: Taylor Frederick MRN: [id number] DOB/AGE: [birth year] 53 y.o.  Admit date: 01/30/2015 Discharge date: 02/02/2015  Admission Diagnoses:  Closed fracture of left distal tibia, initial encounter [S82.302A] Closed fracture of proximal end of left fibula [S82.832A] Comminuted displaced fracture left distal tibia with intra-articular extension  Discharge Diagnoses: Patient Active Problem List   Diagnosis Date Noted  . Closed left tibial fracture 01/30/2015  . Fibrocystic disease of both breasts 09/21/2014  . Routine general medical examination at a health care facility 07/10/2013  . Fatigue 06/23/2012  . Hypothyroidism 10/19/2009  . VENOUS INSUFFICIENCY 10/19/2009  Comminuted displaced fracture left distal tibia with intra-articular extension  Past Medical History  Diagnosis Date  . Hypothyroidism   . DVT (deep venous thrombosis) (HCC) 2003    With BCP's  . Venous insufficiency   . Diffuse cystic mastopathy   . Asymptomatic varicose veins      Transfusion: None   Consultants (if any):  None  Discharged Condition: Improved  Hospital Course: Taylor Frederick is an 54 y.o. female who was admitted 01/30/2015 with a diagnosis of comminuted displaced fracture left distal tibia with intra-articular extension and went to the operating room on 01/31/2015 and underwent a open reduction and internal fixation of left distal tibia fracture   Surgeries: Procedure(s): OPEN REDUCTION INTERNAL FIXATION (ORIF) DISTAL TIBIA  FRACTURE on 01/30/2015 - 01/31/2015 Patient tolerated the surgery well. Taken to PACU where she was stabilized and then transferred to the orthopedic floor.  Started on Lovenox  q 12 hrs. Foot pumps applied to right ankle, patient in left ankle post-op splint. Heels elevated on bed with rolled towels. No evidence of DVT. Negative Homan.  Physical therapy started on day #1 for gait training and transfer. OT started day #1  for ADL and assisted devices.  Patient's IV , foley were d/c on POD1  Implants: Biomet 9-hole distal tibial locking plate plus two 4-0 cannulated screws.  She was given perioperative antibiotics:  Anti-infectives    Start     Dose/Rate Route Frequency Ordered Stop   01/31/15 1945  ceFAZolin (ANCEF) IVPB 2 g/50 mL premix     2 g 100 mL/hr over 30 Minutes Intravenous Every 6 hours 01/31/15 1937 02/01/15 0859   01/30/15 1736  ceFAZolin (ANCEF) IVPB 2 g/50 mL premix     2 g 100 mL/hr over 30 Minutes Intravenous 30 min pre-op 01/30/15 1737 01/31/15 1515    .  She was given sequential compression devices, early ambulation, and Lovenox for DVT prophylaxis.  She benefited maximally from the hospital stay and there were no complications.    Recent vital signs:  Filed Vitals:   02/02/15 0429 02/02/15 0743  BP: 134/78 131/74  Pulse: 79 92  Temp: 98.6 F (37 C) 98.4 F (36.9 C)  Resp: 18 20    Recent laboratory studies:  Lab Results  Component Value Date   HGB 11.6* 02/02/2015   HGB 13.3 01/30/2015   HGB 14.0 07/20/2014   Lab Results  Component Value Date   WBC 11.5* 02/02/2015   PLT 341 02/02/2015   No results found for: INR Lab Results  Component Value Date   NA 137 02/02/2015   K 3.7 02/02/2015   CL 102 02/02/2015   CO2 29 02/02/2015   BUN 11 02/02/2015   CREATININE 0.75 02/02/2015   GLUCOSE 128* 02/02/2015    Discharge Medications:     Medication List    TAKE these medications  aspirin 81 MG tablet  Take 81 mg by mouth daily.     enoxaparin 30 MG/0.3ML injection  Commonly known as:  LOVENOX  Inject 0.3 mLs (30 mg total) into the skin every 12 (twelve) hours.     omeprazole 20 MG capsule  Commonly known as:  PRILOSEC  Take 1 capsule (20 mg total) by mouth daily as needed.     oxyCODONE 5 MG immediate release tablet  Commonly known as:  Oxy IR/ROXICODONE  Take 1-2 tablets (5-10 mg total) by mouth every 4 (four) hours as needed for moderate pain.      SYNTHROID 112 MCG tablet  Generic drug:  levothyroxine  TAKE ONE (1) TABLET EACH DAY        Diagnostic Studies: Dg Knee 2 Views Left  01/30/2015  CLINICAL DATA:  Larey SeatFell off a ladder with knee pain today EXAM: LEFT KNEE - 1-2 VIEW COMPARISON:  None. FINDINGS: There is a mildly comminuted fracture to the fibular head. Fracture fragments are mildly displaced. IMPRESSION: Proximal fibular fracture Electronically Signed   By: Esperanza Heiraymond  Rubner M.D.   On: 01/30/2015 16:58   Dg Tibia/fibula Left  01/31/2015  CLINICAL DATA:  Operative fixation of a distal tibia fracture. EXAM: LEFT TIBIA AND FIBULA - 2 VIEW COMPARISON:  Yesterday. FINDINGS: Five C-arm views of left lower leg demonstrate screw and plate fixation of the previously demonstrated distal tibia fracture. The fragments are in grossly anatomic position and alignment on these views. IMPRESSION: Hardware fixation of the previously seen distal fibula fracture with anatomic position and alignment on C-arm views. Electronically Signed   By: Beckie SaltsSteven  Reid M.D.   On: 01/31/2015 17:38   Dg Ankle Complete Left  01/30/2015  CLINICAL DATA:  Status post fall from a ladder today with onset of left lower leg pain. Initial encounter. EXAM: LEFT ANKLE COMPLETE - 3+ VIEW COMPARISON:  None. FINDINGS: The patient has an acute fracture of the distal tibia. The fracture extends from the medial cortex approximately 12.5 cm above the plafond in a spiral orientation through the lateral lip of the plafond. The posterior malleolus appears to be a separate fragment. The fracture is impacted approximately 2 cm. The distal fragment is medially displaced approximately 0.8 cm. No other fracture is identified. The tibiotalar joint is located. Soft tissue swelling is noted. IMPRESSION: Acute distal tibial fracture is described above. Electronically Signed   By: Drusilla Kannerhomas  Dalessio M.D.   On: 01/30/2015 16:57   Dg Chest Portable 1 View  01/30/2015  CLINICAL DATA:  Preoperative  examination for patient with a left tibial fracture. Initial encounter. EXAM: PORTABLE CHEST 1 VIEW COMPARISON:  None. FINDINGS: The lungs are clear. Heart size is normal. No pneumothorax or pleural effusion. No focal bony abnormality. IMPRESSION: Negative chest. Electronically Signed   By: Drusilla Kannerhomas  Dalessio M.D.   On: 01/30/2015 18:25   Dg C-arm 61-120 Min-no Report  01/31/2015  CLINICAL DATA: surgery C-ARM 61-120 MINUTES Fluoroscopy was utilized by the requesting physician.  No radiographic interpretation.   Disposition: Plan on discharge home with homehealth PT either today (02/02/15) or tomorrow (02/03/15), depending on clinical condition and progress with PT.  Pt is to remain non-weightbearing to the left lower extremity and remain in her post-op splint.  She will continue Lovenox 30mg  BID for the next 14 days and take ASA 325mg  daily for DVT prophylaxis.  Pt to be discharged with prescription for a walker as well as crutches to assist with ambulation at home.  Follow-up Information    Follow up with Meriel Pica, PA-C In 12 days.   Specialty:  Physician Assistant   Why:  For suture removal, For wound re-check   Contact information:   [address] [place] [address] [phone]      Signed: Meriel Pica PA-C 02/02/2015, 12:38 PM

## 2015-02-04 DIAGNOSIS — S82832A Other fracture of upper and lower end of left fibula, initial encounter for closed fracture: Secondary | ICD-10-CM | POA: Insufficient documentation

## 2015-02-04 DIAGNOSIS — S82873A Displaced pilon fracture of unspecified tibia, initial encounter for closed fracture: Secondary | ICD-10-CM | POA: Insufficient documentation

## 2015-02-07 NOTE — Addendum Note (Signed)
Addendum  created 02/07/15 [id number] by Yevette EdwardsJames G Adams, MD   Modules edited: Anesthesia Attestations

## 2015-02-23 ENCOUNTER — Telehealth: Payer: Self-pay | Admitting: Internal Medicine

## 2015-02-23 ENCOUNTER — Ambulatory Visit (INDEPENDENT_AMBULATORY_CARE_PROVIDER_SITE_OTHER): Payer: BLUE CROSS/BLUE SHIELD | Admitting: Family Medicine

## 2015-02-23 ENCOUNTER — Inpatient Hospital Stay
Admission: EM | Admit: 2015-02-23 | Discharge: 2015-02-25 | DRG: 167 | Disposition: A | Discharge status: Home / Self Care | Payer: BLUE CROSS/BLUE SHIELD | Attending: Internal Medicine | Admitting: Internal Medicine

## 2015-02-23 ENCOUNTER — Encounter: Payer: Self-pay | Admitting: Family Medicine

## 2015-02-23 ENCOUNTER — Telehealth: Payer: Self-pay | Admitting: *Deleted

## 2015-02-23 ENCOUNTER — Telehealth: Payer: Self-pay

## 2015-02-23 ENCOUNTER — Other Ambulatory Visit: Payer: Self-pay | Admitting: Family Medicine

## 2015-02-23 ENCOUNTER — Ambulatory Visit (INDEPENDENT_AMBULATORY_CARE_PROVIDER_SITE_OTHER)
Admission: RE | Admit: 2015-02-23 | Discharge: 2015-02-23 | Disposition: A | Discharge status: Home / Self Care | Payer: BLUE CROSS/BLUE SHIELD | Source: Ambulatory Visit | Attending: Family Medicine | Admitting: Family Medicine

## 2015-02-23 ENCOUNTER — Ambulatory Visit
Admission: RE | Admit: 2015-02-23 | Discharge: 2015-02-23 | Disposition: A | Discharge status: Home / Self Care | Payer: BLUE CROSS/BLUE SHIELD | Source: Ambulatory Visit | Attending: Family Medicine | Admitting: Family Medicine

## 2015-02-23 VITALS — BP 118/70 | HR 97 | Temp 98.1°F

## 2015-02-23 DIAGNOSIS — R0781 Pleurodynia: Secondary | ICD-10-CM | POA: Insufficient documentation

## 2015-02-23 DIAGNOSIS — R079 Chest pain, unspecified: Secondary | ICD-10-CM | POA: Insufficient documentation

## 2015-02-23 DIAGNOSIS — Z9049 Acquired absence of other specified parts of digestive tract: Secondary | ICD-10-CM | POA: Insufficient documentation

## 2015-02-23 DIAGNOSIS — E039 Hypothyroidism, unspecified: Secondary | ICD-10-CM | POA: Diagnosis present

## 2015-02-23 DIAGNOSIS — I872 Venous insufficiency (chronic) (peripheral): Secondary | ICD-10-CM | POA: Diagnosis present

## 2015-02-23 DIAGNOSIS — I2699 Other pulmonary embolism without acute cor pulmonale: Secondary | ICD-10-CM

## 2015-02-23 DIAGNOSIS — Z803 Family history of malignant neoplasm of breast: Secondary | ICD-10-CM | POA: Diagnosis not present

## 2015-02-23 DIAGNOSIS — Z86718 Personal history of other venous thrombosis and embolism: Secondary | ICD-10-CM

## 2015-02-23 DIAGNOSIS — S82892D Other fracture of left lower leg, subsequent encounter for closed fracture with routine healing: Secondary | ICD-10-CM

## 2015-02-23 DIAGNOSIS — Z7982 Long term (current) use of aspirin: Secondary | ICD-10-CM

## 2015-02-23 DIAGNOSIS — Z8041 Family history of malignant neoplasm of ovary: Secondary | ICD-10-CM | POA: Diagnosis not present

## 2015-02-23 DIAGNOSIS — K219 Gastro-esophageal reflux disease without esophagitis: Secondary | ICD-10-CM | POA: Diagnosis present

## 2015-02-23 DIAGNOSIS — Z833 Family history of diabetes mellitus: Secondary | ICD-10-CM

## 2015-02-23 DIAGNOSIS — R9389 Abnormal findings on diagnostic imaging of other specified body structures: Secondary | ICD-10-CM

## 2015-02-23 DIAGNOSIS — I82411 Acute embolism and thrombosis of right femoral vein: Secondary | ICD-10-CM | POA: Diagnosis present

## 2015-02-23 DIAGNOSIS — R918 Other nonspecific abnormal finding of lung field: Secondary | ICD-10-CM | POA: Insufficient documentation

## 2015-02-23 DIAGNOSIS — R609 Edema, unspecified: Secondary | ICD-10-CM

## 2015-02-23 DIAGNOSIS — Z79899 Other long term (current) drug therapy: Secondary | ICD-10-CM

## 2015-02-23 DIAGNOSIS — X58XXXD Exposure to other specified factors, subsequent encounter: Secondary | ICD-10-CM | POA: Diagnosis present

## 2015-02-23 DIAGNOSIS — Z8249 Family history of ischemic heart disease and other diseases of the circulatory system: Secondary | ICD-10-CM

## 2015-02-23 DIAGNOSIS — Z86711 Personal history of pulmonary embolism: Secondary | ICD-10-CM | POA: Diagnosis present

## 2015-02-23 LAB — PROTIME-INR
INR: 1.13
Prothrombin Time: 14.7 seconds (ref 11.4–15.0)

## 2015-02-23 LAB — COMPREHENSIVE METABOLIC PANEL
ALT: 18 U/L (ref 0–35)
AST: 22 U/L (ref 0–37)
Albumin: 3.6 g/dL (ref 3.5–5.2)
Alkaline Phosphatase: 126 U/L — ABNORMAL HIGH (ref 39–117)
BILIRUBIN TOTAL: 0.5 mg/dL (ref 0.2–1.2)
BUN: 12 mg/dL (ref 6–23)
CO2: 28 meq/L (ref 19–32)
CREATININE: 0.71 mg/dL (ref 0.40–1.20)
Calcium: 9.4 mg/dL (ref 8.4–10.5)
Chloride: 98 mEq/L (ref 96–112)
GFR: 91.53 mL/min (ref >60.00)
GLUCOSE: 102 mg/dL — AB (ref 70–99)
Potassium: 3.7 mEq/L (ref 3.5–5.1)
Sodium: 133 mEq/L — ABNORMAL LOW (ref 135–145)
Total Protein: 7.6 g/dL (ref 6.0–8.3)

## 2015-02-23 LAB — CBC WITH DIFFERENTIAL/PLATELET
BASOS ABS: 0 10*3/uL (ref 0.0–0.1)
Basophils Relative: 0.3 % (ref 0.0–3.0)
EOS ABS: 0.1 10*3/uL (ref 0.0–0.7)
Eosinophils Relative: 1.2 % (ref 0.0–5.0)
HCT: 37.8 % (ref 36.0–46.0)
Hemoglobin: 12.5 g/dL (ref 12.0–15.0)
LYMPHS ABS: 1.6 10*3/uL (ref 0.7–4.0)
LYMPHS PCT: 15.1 % (ref 12.0–46.0)
MCHC: 33 g/dL (ref 30.0–36.0)
MCV: 86.1 fl (ref 78.0–100.0)
Monocytes Absolute: 0.7 10*3/uL (ref 0.1–1.0)
Monocytes Relative: 6.2 % (ref 3.0–12.0)
NEUTROS ABS: 8.4 10*3/uL — AB (ref 1.4–7.7)
NEUTROS PCT: 77.2 % — AB (ref 43.0–77.0)
PLATELETS: 362 10*3/uL (ref 150.0–400.0)
RBC: 4.39 Mil/uL (ref 3.87–5.11)
RDW: 13.9 % (ref 11.5–15.5)
WBC: 10.9 10*3/uL — ABNORMAL HIGH (ref 4.0–10.5)

## 2015-02-23 LAB — APTT: aPTT: 36 seconds (ref 24–36)

## 2015-02-23 LAB — D-DIMER, QUANTITATIVE: D-Dimer, Quant: 6.34 ug/mL-FEU — ABNORMAL HIGH (ref 0.00–0.48)

## 2015-02-23 LAB — TROPONIN I: TNIDX: 1.99 ug/l — ABNORMAL HIGH (ref 0.00–0.06)

## 2015-02-23 LAB — HEPARIN LEVEL (UNFRACTIONATED): HEPARIN UNFRACTIONATED: 0.27 [IU]/mL — AB (ref 0.30–0.70)

## 2015-02-23 LAB — POCT I-STAT CREATININE: Creatinine, Ser: 0.7 mg/dL (ref 0.44–1.00)

## 2015-02-23 MED ORDER — IOHEXOL 350 MG/ML SOLN
75.0000 mL | Freq: Once | INTRAVENOUS | Status: AC | PRN
  Administered 2015-02-23: 75 mL via INTRAVENOUS

## 2015-02-23 MED ORDER — HEPARIN BOLUS VIA INFUSION
4200.0000 [IU] | Freq: Once | INTRAVENOUS | Status: AC
  Administered 2015-02-23: 4200 [IU] via INTRAVENOUS
  Filled 2015-02-23: qty 4200

## 2015-02-23 MED ORDER — HEPARIN (PORCINE) IN NACL 100-0.45 UNIT/ML-% IJ SOLN
1350.0000 [IU]/h | INTRAMUSCULAR | Status: DC
  Administered 2015-02-23: 1350 [IU]/h via INTRAVENOUS
  Filled 2015-02-23: qty 250

## 2015-02-23 MED ORDER — ACETAMINOPHEN 325 MG PO TABS: 650.0000 mg | ORAL_TABLET | Freq: Four times a day (QID) | ORAL | Status: DC | PRN

## 2015-02-23 MED ORDER — ONDANSETRON HCL 4 MG PO TABS: 4.0000 mg | ORAL_TABLET | Freq: Four times a day (QID) | ORAL | Status: DC | PRN

## 2015-02-23 MED ORDER — OXYCODONE HCL 5 MG PO TABS: 5.0000 mg | ORAL_TABLET | ORAL | Status: DC | PRN

## 2015-02-23 MED ORDER — ACETAMINOPHEN 650 MG RE SUPP: 650.0000 mg | Freq: Four times a day (QID) | RECTAL | Status: DC | PRN

## 2015-02-23 MED ORDER — MORPHINE SULFATE (PF) 2 MG/ML IV SOLN: 2.0000 mg | INTRAVENOUS | Status: DC | PRN

## 2015-02-23 MED ORDER — HEPARIN (PORCINE) IN NACL 100-0.45 UNIT/ML-% IJ SOLN
17.0000 [IU]/kg/h | INTRAMUSCULAR | Status: DC
  Administered 2015-02-23: 17 [IU]/kg/h via INTRAVENOUS
  Filled 2015-02-23 (×2): qty 250

## 2015-02-23 MED ORDER — INFLUENZA VAC SPLIT QUAD 0.5 ML IM SUSY
0.5000 mL | PREFILLED_SYRINGE | INTRAMUSCULAR | Status: AC
  Administered 2015-02-24: 0.5 mL via INTRAMUSCULAR
  Filled 2015-02-23: qty 0.5

## 2015-02-23 MED ORDER — LEVOTHYROXINE SODIUM 112 MCG PO TABS
112.0000 ug | ORAL_TABLET | Freq: Every day | ORAL | Status: DC
  Administered 2015-02-24 – 2015-02-25 (×2): 112 ug via ORAL
  Filled 2015-02-23 (×2): qty 1

## 2015-02-23 MED ORDER — SENNOSIDES-DOCUSATE SODIUM 8.6-50 MG PO TABS
1.0000 | ORAL_TABLET | Freq: Every evening | ORAL | Status: DC | PRN
  Administered 2015-02-24: 21:00:00 1 via ORAL
  Filled 2015-02-23: qty 1

## 2015-02-23 MED ORDER — HEPARIN BOLUS VIA INFUSION
1000.0000 [IU] | Freq: Once | INTRAVENOUS | Status: AC
  Administered 2015-02-23: 1000 [IU] via INTRAVENOUS
  Filled 2015-02-23: qty 1000

## 2015-02-23 MED ORDER — ONDANSETRON HCL 4 MG/2ML IJ SOLN: 4.0000 mg | Freq: Four times a day (QID) | INTRAMUSCULAR | Status: DC | PRN

## 2015-02-23 NOTE — Patient Instructions (Signed)
Nice to meet you. I will call you with your results. If your symptoms worsen over night, please go to the ER.

## 2015-02-23 NOTE — Addendum Note (Signed)
Addended by: Dianne DunARON, TALIA M on: 02/23/2015 06:23 PM   Modules accepted: Kipp BroodSmartSet

## 2015-02-23 NOTE — Telephone Encounter (Signed)
Pt called wanting dr Alphonsus Siasletvak to call her regarding Last 2 night haven't be able to sleep Chest hurts taking deep breath stating her left side hurts Transferred call to [facility]

## 2015-02-23 NOTE — Telephone Encounter (Signed)
Aware-was dx with PEs - in ED

## 2015-02-23 NOTE — Progress Notes (Signed)
Pre visit review using our clinic review tool, if applicable. No additional management support is needed unless otherwise documented below in the visit note. 

## 2015-02-23 NOTE — Telephone Encounter (Signed)
bilat PE with an area of infiltrate also (vs pulmonary infarct) Symptomatic  [facility] to send her to the ED for urgent eval and to stabilize her if needed Will cc PCP

## 2015-02-23 NOTE — ED Provider Notes (Signed)
[facility] Emergency Department Provider Note  Time seen: 3:49 PM  I have reviewed the triage vital signs and the nursing notes.   HISTORY  Chief Complaint Chest Pain    HPI Taylor Frederick is a 54 y.o. female with a past medical history of DVT, gastric reflux, recent left ankle surgery 4 weeks ago who presents the emergency department with a blood clot. According to the patient for the past 2 days she has had chest discomfort especially worse with lying, more so on the left upper chest. States pain with deep inspiration. Also states a mild cough. Patient states a history of DVT in her leg and 2003, she was on blood thinners for several months and then these were discontinued that she was taken off of her birth control. States after the surgery she has been on Lovenox for the first 2 weeks however this was discontinued approximately 2 weeks ago by her orthopedist. States she has had left leg pain in her calf for the past several weeks, but she was told this is likely nerve pain due to the surgery. Describes her chest pain is moderate, worse with deep inspiration more so on the left.      Past Medical History  Diagnosis Date  . Hypothyroidism   . DVT (deep venous thrombosis) ([place]) 2003    With BCP's  . Venous insufficiency   . Diffuse cystic mastopathy   . Asymptomatic varicose veins     Patient Active Problem List   Diagnosis Date Noted  . Chest pain 02/23/2015  . Closed left tibial fracture 01/30/2015  . Fibrocystic disease of both breasts 09/21/2014  . Routine general medical examination at a health care facility 07/10/2013  . Fatigue 06/23/2012  . Hypothyroidism 10/19/2009  . VENOUS INSUFFICIENCY 10/19/2009    Past Surgical History  Procedure Laterality Date  . Vein procedure      Dr. Jamal Collin  . Dilation and curettage of uterus  ~2003  . Tubal ligation  2005  . Vein closure Left 2008    Left Leg  . Orif ankle fracture Left 01/31/2015   Procedure: OPEN REDUCTION INTERNAL FIXATION (ORIF) DISTAL TIBIA  FRACTURE;  Surgeon: Corky Mull, MD;  [name];  Service: Orthopedics;  Laterality: Left;    Current Outpatient Rx  Name  Route  Sig  Dispense  Refill  . aspirin 81 MG tablet   Oral   Take 81 mg by mouth daily.         Marland Kitchen omeprazole (PRILOSEC) 20 MG capsule   Oral   Take 1 capsule (20 mg total) by mouth daily as needed.   1 capsule   0   . oxyCODONE (OXY IR/ROXICODONE) 5 MG immediate release tablet   Oral   Take 1-2 tablets (5-10 mg total) by mouth every 4 (four) hours as needed for moderate pain.   60 tablet   0   . SYNTHROID 112 MCG tablet      TAKE ONE (1) TABLET EACH DAY   90 tablet   3     Allergies Review of patient's allergies indicates no known allergies.  Family History  Problem Relation Age of Onset  . Heart disease Mother     CHF  . Diabetes Mother   . Hypothyroidism Mother   . Cancer Maternal Aunt     Breast and Ovarian  (Believes BRCA positive)  . Cancer Other     Breast and ovarian CA (Believes BRCA positive)  .  Cancer Maternal Aunt     Melanoma    Social History Social History  Substance Use Topics  . Smoking status: Never Smoker   . Smokeless tobacco: Never Used  . Alcohol Use: No    Review of Systems Constitutional: Negative for fever. Cardiovascular: Negative for chest pain. Respiratory: Negative for shortness of breath. Gastrointestinal: Negative for abdominal pain Musculoskeletal: Negative for back pain. Neurological: Negative for headache 10-point ROS otherwise negative.  ____________________________________________   PHYSICAL EXAM:  VITAL SIGNS: ED Triage Vitals  Enc Vitals Group     BP 02/23/15 1521 138/90 mmHg     Pulse Rate 02/23/15 1521 115     Resp 02/23/15 1521 20     Temp 02/23/15 1521 98.3 F (36.8 C)     Temp Source 02/23/15 1521 Oral     SpO2 02/23/15 1521 98 %     Weight 02/23/15 1521 178 lb (80.74 kg)     Height 02/23/15 1521 5'  3" (1.6 m)     Head Cir --      Peak Flow --      Pain Score 02/23/15 1521 7     Pain Loc --      Pain Edu? --      Excl. in [place]? --    Constitutional: Alert and oriented. Well appearing and in no distress. Eyes: Normal exam ENT   Head: Normocephalic and atraumatic.   Mouth/Throat: Mucous membranes are moist. Cardiovascular: Normal rate, regular rhythm. No murmur Respiratory: Normal respiratory effort without tachypnea nor retractions. Breath sounds are clear and equal bilaterally. No wheezes/rales/rhonchi. Gastrointestinal: Soft and nontender. No distention.  Musculoskeletal:Left lower extremity short leg cast, no calf tenderness palpation in either leg. No edema noted in the right lower extremity. Good cap refill in the toes of the left lower extremity.  Neurologic:  Normal speech and language. No gross focal neurologic deficits  Skin:  Skin is warm, dry and intact.  Psychiatric: Mood and affect are normal. Speech and behavior are normal.   ____________________________________________    INITIAL IMPRESSION / ASSESSMENT AND PLAN / ED COURSE  Pertinent labs & imaging results that were available during my care of the patient were reviewed by me and considered in my medical decision making (see chart for details).  Patient had an outpatient CT angiography scheduled by her primary care doctor today which showed bilateral pulmonary emboli as well as a left lung likely infarction. We will dose the patient pain medication, and started on a heparin drip and admitted to the hospital. Patient's vital show an elevated heart rate of approximately 110 currently with oxygen saturation of 95-97 percent on room air.   CRITICAL CARE Performed by: Harvest Dark   Total critical care time: 30 minutes  Critical care time was exclusive of separately billable procedures and treating other patients.  Critical care was necessary to treat or prevent imminent or life-threatening  deterioration.  Critical care was time spent personally by me on the following activities: development of treatment plan with patient and/or surrogate as well as nursing, discussions with consultants, evaluation of patient's response to treatment, examination of patient, obtaining history from patient or surrogate, ordering and performing treatments and interventions, ordering and review of laboratory studies, ordering and review of radiographic studies, pulse oximetry and re-evaluation of patient's condition.   ____________________________________________   FINAL CLINICAL IMPRESSION(S) / ED DIAGNOSES  Pulmonary embolus Pulmonary infarction   Harvest Dark, MD 02/23/15 1556

## 2015-02-23 NOTE — Telephone Encounter (Signed)
Pt has appt 02/23/15 at 11:45 with Dr Dayton MartesAron.

## 2015-02-23 NOTE — ED Notes (Signed)
MD bedside

## 2015-02-23 NOTE — Progress Notes (Addendum)
Subjective:   Patient ID: Taylor Frederick, female    DOB: 06-09-1960, 54 y.o.   MRN: [id number]  Taylor Frederick is a pleasant 54 y.o. year old female pt of Dr. Silvio Pate, new to me, who presents to clinic today with Shoulder Pain  on 02/23/2015  HPI:  3 days of left neck/back pain.  Pain does seem mainly concentrated in her left shoulder, chest and back.  Pain is aggrevated by taking deep breaths.  Also seems worse at night. No SOB.  No diaphoresis.  No dizziness.  No swelling in RLE.  Of note, had left ORIF of distal tib rx on 01/31/15.  She received 2 weeks of Lovenox subcutaneous for DVT prophylaxis and has since been taking ASA.   Has to sleep in an "odd" position since surgery.  Sometimes sleeps upright in a chair.  Also using a manual wheelchair and using her upper body more than she typically does.  Also h/o DVT in 2003- felt to be secondary to OCPs.  Was on coumadin for less than a year.    Current Outpatient Prescriptions on File Prior to Visit  Medication Sig Dispense Refill  . aspirin 81 MG tablet Take 81 mg by mouth daily.    Marland Kitchen omeprazole (PRILOSEC) 20 MG capsule Take 1 capsule (20 mg total) by mouth daily as needed. 1 capsule 0  . oxyCODONE (OXY IR/ROXICODONE) 5 MG immediate release tablet Take 1-2 tablets (5-10 mg total) by mouth every 4 (four) hours as needed for moderate pain. 60 tablet 0  . SYNTHROID 112 MCG tablet TAKE ONE (1) TABLET EACH DAY 90 tablet 3   No current facility-administered medications on file prior to visit.    No Known Allergies  Past Medical History  Diagnosis Date  . Hypothyroidism   . DVT (deep venous thrombosis) ([place]) 2003    With BCP's  . Venous insufficiency   . Diffuse cystic mastopathy   . Asymptomatic varicose veins     Past Surgical History  Procedure Laterality Date  . Vein procedure      Dr. Jamal Collin  . Dilation and curettage of uterus  ~2003  . Tubal ligation  2005  . Vein closure Left 2008    Left Leg  . Orif  ankle fracture Left 01/31/2015    Procedure: OPEN REDUCTION INTERNAL FIXATION (ORIF) DISTAL TIBIA  FRACTURE;  Surgeon: Corky Mull, MD;  [name];  Service: Orthopedics;  Laterality: Left;    Family History  Problem Relation Age of Onset  . Heart disease Mother     CHF  . Diabetes Mother   . Hypothyroidism Mother   . Cancer Maternal Aunt     Breast and Ovarian  (Believes BRCA positive)  . Cancer Other     Breast and ovarian CA (Believes BRCA positive)  . Cancer Maternal Aunt     Melanoma    Social History   Social History  . Marital Status: Married    Spouse Name: N/A  . Number of Children: 1  . Years of Education: N/A   Occupational History  . Merchant [employer] officer  .           Social History Main Topics  . Smoking status: Never Smoker   . Smokeless tobacco: Never Used  . Alcohol Use: No  . Drug Use: No  . Sexual Activity: Not on file   Other Topics Concern  . Not on file   Social History  Narrative   The PMH, PSH, Social History, Family History, Medications, and allergies have been reviewed in [facility], and have been updated if relevant.   Review of Systems  Constitutional: Negative for fever.  HENT: Negative.   Respiratory: Positive for cough and shortness of breath. Negative for wheezing.   Cardiovascular: Positive for chest pain and palpitations. Negative for leg swelling.  Gastrointestinal: Negative.   Endocrine: Negative.   Genitourinary: Negative.   Musculoskeletal: Positive for neck pain.  Neurological: Negative.   Hematological: Negative.   All other systems reviewed and are negative.      Objective:    BP 118/70 mmHg  Pulse 97  Temp(Src) 98.1 F (36.7 C) (Oral)  Wt   SpO2 98%  LMP  (Approximate)   Physical Exam  Constitutional: She appears well-developed and well-nourished. No distress.  In manual wheelchair with left leg elevated and in cast  HENT:  Head: Normocephalic.  Eyes: Conjunctivae are normal.    Neck: Normal range of motion.  Cardiovascular: Normal rate and regular rhythm.   Pulmonary/Chest: Effort normal and breath sounds normal.  Pain elicited with deep inspiration  Musculoskeletal:  No edema or pain with palpation of RLE  Skin: Skin is warm and dry. She is not diaphoretic.  Psychiatric: She has a normal mood and affect. Her behavior is normal. Judgment and thought content normal.  Nursing note and vitals reviewed.         Assessment & Plan:   Chest pain, unspecified chest pain type No Follow-up on file.

## 2015-02-23 NOTE — Telephone Encounter (Signed)
Patient Name: Taylor SimmeringRHONDA Frederick DOB: 03/22/1960 Initial Comment Caller states needs a call from the Dr. Marland Kitchen-Pain on the left side; from her neck down; also in her back; hard to take a deep breath; it hurts; had surgery on her leg on the 28 and she is not sure if it has something to do with it Nurse Assessment Nurse: Yetta BarreJones, RN, Miranda Date/Time (Eastern Time): 02/23/2015 8:45:58 AM Confirm and document reason for call. If symptomatic, describe symptoms. ---Caller states the last couple of days she has been having pain in the left side of her back and neck. The pain makes it hard for her to breath. Pain is worse at night. Has the patient traveled out of the country within the last 30 days? ---Not Applicable Does the patient have any new or worsening symptoms? ---Yes Will a triage be completed? ---Yes Related visit to physician within the last 2 weeks? ---No Does the PT have any chronic conditions? (i.e. diabetes, asthma, etc.) ---Yes List chronic conditions. ---broken leg and surgery last month, Thyroid Did the patient indicate they were pregnant? ---No Is this a behavioral health or substance abuse call? ---No Guidelines Guideline Title Affirmed Question Affirmed Notes Back Pain [1] MODERATE back pain (e.g., interferes with normal activities) AND [2] present > 3 days Final Disposition User See PCP When Office is Open (within 3 days) Yetta BarreJones, RN, Miranda Comments No appt available with PCP today and pt would like to be seen today. Appt scheduled to see Dr. Dayton MartesAron today at 1145. Disagree/Comply: Comply

## 2015-02-23 NOTE — Progress Notes (Signed)
ANTICOAGULATION CONSULT NOTE - Initial Consult  Pharmacy Consult for heparin drip  Indication: pulmonary embolus  No Known Allergies  Patient Measurements: Height: 5\' 3"  (160 cm) Weight: 178 lb (80.74 kg) IBW/kg (Calculated) : 52.4 Heparin Dosing Weight: 70.1 kg  Vital Signs: Temp: 98.3 F (36.8 C) (12/21 1521) Temp Source: Oral (12/21 1521) BP: 138/90 mmHg (12/21 1521) Pulse Rate: 115 (12/21 1521)  Labs:  Recent Labs  02/23/15 1221 02/23/15 1355  HGB 12.5  --   HCT 37.8  --   PLT 362.0  --   CREATININE 0.71 0.70    Estimated Creatinine Clearance: 82.7 mL/min (by C-G formula based on Cr of 0.7).  Assessment: Pharmacy consulted to dose heparin drip in this 54 year old female for a pulmonary embolism. Patient has a history of DVT in 2003. Patient had left ankle surgery ~ 4 weeks ago and was taking enoxaparin, which was discontinued about 2 weeks ago. Patient was not taking any anticoagulants prior to admission.  CBC has been drawn with a Hgb of 12.5 and Plt of 362 INR and APPT ordered STAT  Heparin dosing weight = 70.1 kg  Goal of Therapy:  Heparin level 0.3-0.7 units/ml Monitor platelets by anticoagulation protocol: Yes   Plan:  Give 4200 units bolus x 1 (~ 60 units/kg) Start heparin infusion at 1200 units/hr (~ 17 units/kg/hr) Check anti-Xa level in 6 hours and daily while on heparin Continue to monitor H&H and platelets  Thank you for the consult.  Jodelle RedMary M Tylesha Gibeault 02/23/2015,4:10 PM

## 2015-02-23 NOTE — Progress Notes (Signed)
ANTICOAGULATION CONSULT NOTE - Initial Consult  Pharmacy Consult for heparin drip  Indication: pulmonary embolus  No Known Allergies  Patient Measurements: Height: 5\' 3"  (160 cm) Weight: 177 lb (80.287 kg) IBW/kg (Calculated) : 52.4 Heparin Dosing Weight: 70.1 kg  Vital Signs: Temp: 98.6 F (37 C) (12/21 2102) Temp Source: Oral (12/21 2102) BP: 121/71 mmHg (12/21 2318) Pulse Rate: 103 (12/21 2318)  Labs:  Recent Labs  02/23/15 1221 02/23/15 1355 02/23/15 1559 02/23/15 2246  HGB 12.5  --   --   --   HCT 37.8  --   --   --   PLT 362.0  --   --   --   APTT  --   --  36  --   LABPROT  --   --  14.7  --   INR  --   --  1.13  --   HEPARINUNFRC  --   --   --  0.27*  CREATININE 0.71 0.70  --   --     Estimated Creatinine Clearance: 82.6 mL/min (by C-G formula based on Cr of 0.7).  Assessment: Pharmacy consulted to dose heparin drip in this 54 year old female for a pulmonary embolism. Patient has a history of DVT in 2003. Patient had left ankle surgery ~ 4 weeks ago and was taking enoxaparin, which was discontinued about 2 weeks ago. Patient was not taking any anticoagulants prior to admission.  CBC has been drawn with a Hgb of 12.5 and Plt of 362 INR and APPT ordered STAT  Heparin dosing weight = 70.1 kg  Goal of Therapy:  Heparin level 0.3-0.7 units/ml Monitor platelets by anticoagulation protocol: Yes   Plan:  Heparin level subtherapeutic. 1000 unit IV x 1 bolus and increase rate to 1350 units/hr. Will check level again in 6 hours.  Carola FrostNathan A Ziggy Reveles, Pharm.D., BCPS Clinical Pharmacist 02/23/2015,11:41 PM

## 2015-02-23 NOTE — Telephone Encounter (Signed)
err

## 2015-02-23 NOTE — ED Notes (Signed)
Pt sent over from CT with a positive PE.Taylor Frederick. Pt states she had recent Fx repair of the left leg, cast present.. States over the past 2 days she has been having SOB with left upper chest pain.. States she has a hx of DVT x1.

## 2015-02-23 NOTE — H&P (Signed)
[facility] NAME: Taylor Frederick    MR#:  [id number]  DATE OF BIRTH:  Oct 08, 1960  DATE OF ADMISSION:  02/23/2015  PRIMARY CARE PHYSICIAN: Viviana Simpler, MD   REQUESTING/REFERRING PHYSICIAN: Dr Vicente Males  CHIEF COMPLAINT:   Pleuritic chest pain lateral foot 2 days HISTORY OF PRESENT ILLNESS:  Taylor Frederick  is a 54 y.o. female with a known history of DVT in the remote past suspected due to use of birth control pills, hypothyroidism comes to the emergency room with bilateral pleuritic chest pain 2 days ago. Patient recently had left ankle fracture which was repaired on 01/31/2015. She received 2 weeks of subcutaneous Lovenox at home. Patient was getting physical therapy active around the house and outside. She started having pleuritic chest pain came to the emergency room was found to have bilateral pulmonary emboli. She is hemodynamically stable, being admitted for further evaluation management. No family history of blood clots. PAST MEDICAL HISTORY:   Past Medical History  Diagnosis Date  . Hypothyroidism   . DVT (deep venous thrombosis) ([place]) 2003    With BCP's  . Venous insufficiency   . Diffuse cystic mastopathy   . Asymptomatic varicose veins     PAST SURGICAL HISTOIRY:   Past Surgical History  Procedure Laterality Date  . Vein procedure      Dr. Jamal Collin  . Dilation and curettage of uterus  ~2003  . Tubal ligation  2005  . Vein closure Left 2008    Left Leg  . Orif ankle fracture Left 01/31/2015    Procedure: OPEN REDUCTION INTERNAL FIXATION (ORIF) DISTAL TIBIA  FRACTURE;  Surgeon: Corky Mull, MD;  [name];  Service: Orthopedics;  Laterality: Left;    SOCIAL HISTORY:   Social History  Substance Use Topics  . Smoking status: Never Smoker   . Smokeless tobacco: Never Used  . Alcohol Use: No    FAMILY HISTORY:   Family History  Problem Relation Age of Onset  . Heart disease Mother      CHF  . Diabetes Mother   . Hypothyroidism Mother   . Cancer Maternal Aunt     Breast and Ovarian  (Believes BRCA positive)  . Cancer Other     Breast and ovarian CA (Believes BRCA positive)  . Cancer Maternal Aunt     Melanoma    DRUG ALLERGIES:  No Known Allergies  REVIEW OF SYSTEMS:  Review of Systems  Constitutional: Negative for fever, chills and weight loss.  HENT: Negative for ear discharge, ear pain and nosebleeds.   Eyes: Negative for blurred vision, pain and discharge.  Respiratory: Positive for shortness of breath. Negative for sputum production, wheezing and stridor.   Cardiovascular: Positive for chest pain. Negative for palpitations, orthopnea and PND.  Gastrointestinal: Negative for nausea, vomiting, abdominal pain and diarrhea.  Genitourinary: Negative for urgency and frequency.  Musculoskeletal: Negative for back pain and joint pain.  Neurological: Positive for weakness. Negative for sensory change, speech change and focal weakness.  Psychiatric/Behavioral: Negative for depression and hallucinations. The patient is not nervous/anxious.   All other systems reviewed and are negative.    MEDICATIONS AT HOME:   Prior to Admission medications   Medication Sig Start Date End Date Taking? Authorizing Provider  aspirin 81 MG tablet Take 81 mg by mouth daily.   Yes Historical Provider, MD  omeprazole (PRILOSEC) 20 MG capsule Take 1 capsule (20 mg total) by mouth daily as needed.  07/20/14  Yes Venia Carbon, MD  oxyCODONE (OXY IR/ROXICODONE) 5 MG immediate release tablet Take 1-2 tablets (5-10 mg total) by mouth every 4 (four) hours as needed for moderate pain. 02/02/15  Yes Lattie Corns, PA-C  SYNTHROID 112 MCG tablet TAKE ONE (1) TABLET EACH DAY 11/11/14  Yes Venia Carbon, MD      VITAL SIGNS:  Blood pressure 146/65, pulse 115, temperature 98.3 F (36.8 C), temperature source Oral, resp. rate 18, height _0  (1.6 m), weight 80.74 kg (178 lb), SpO2 96  %.  PHYSICAL EXAMINATION:  GENERAL:  54 y.o.-year-old patient lying in the bed with no acute distress.  EYES: Pupils equal, round, reactive to light and accommodation. No scleral icterus. Extraocular muscles intact.  HEENT: Head atraumatic, normocephalic. Oropharynx and nasopharynx clear.  NECK:  Supple, no jugular venous distention. No thyroid enlargement, no tenderness.  LUNGS: Normal breath sounds bilaterally, no wheezing, rales,rhonchi or crepitation. No use of accessory muscles of respiration.  CARDIOVASCULAR: S1, S2 normal. No murmurs, rubs, or gallops.  ABDOMEN: Soft, nontender, nondistended. Bowel sounds present. No organomegaly or mass.  EXTREMITIES: No pedal edema, cyanosis, or clubbing.  NEUROLOGIC: Cranial nerves II through XII are intact. Muscle strength 5/5 in all extremities. Sensation intact. Gait not checked.  PSYCHIATRIC: The patient is alert and oriented x 3.  SKIN: No obvious rash, lesion, or ulcer.   LABORATORY PANEL:   CBC  Recent Labs Lab 02/23/15 1221  WBC 10.9*  HGB 12.5  HCT 37.8  PLT 362.0   ------------------------------------------------------------------------------------------------------------------  Chemistries   Recent Labs Lab 02/23/15 1221 02/23/15 1355  NA 133*  --   K 3.7  --   CL 98  --   CO2 28  --   GLUCOSE 102*  --   BUN 12  --   CREATININE 0.71 0.70  CALCIUM 9.4  --   AST 22  --   ALT 18  --   ALKPHOS 126*  --   BILITOT 0.5  --    ------------------------------------------------------------------------------------------------------------------  Cardiac Enzymes No results for input(s): TROPONINI in the last 168 hours. ------------------------------------------------------------------------------------------------------------------  RADIOLOGY:  Dg Chest 2 View  02/23/2015  CLINICAL DATA:  Patient has sustained a recent leg fracture and is not been bearing weight for the past 6 weeks, now with chest pain, nonsmoker.  EXAM: CHEST  2 VIEW COMPARISON:  Portable chest x-ray of January 30, 2015 FINDINGS: There is a new small left pleural effusion. There is no alveolar infiltrate. The cardiac silhouette is enlarged but stable. The pulmonary vascularity is not engorged. There is stable moderate dextrocurvature centered at approximately T7. IMPRESSION: New left pleural effusion of uncertain etiology. Left lower lobe pneumonia or other parenchymal process is not excluded. Further evaluation of the chest with CT scanning is recommended to exclude pulmonary embolism in further evaluate the left lung base. Electronically Signed   By: David  Martinique M.D.   On: 02/23/2015 12:19   Ct Angio Chest W/cm &/or Wo Cm  02/23/2015  CLINICAL DATA:  Shortness of breath and chest pain on the left EXAM: CT ANGIOGRAPHY CHEST WITH CONTRAST TECHNIQUE: Multidetector CT imaging of the chest was performed using the standard protocol during bolus administration of intravenous contrast. Multiplanar CT image reconstructions and MIPs were obtained to evaluate the vascular anatomy. CONTRAST:  174m OMNIPAQUE IOHEXOL 350 MG/ML SOLN COMPARISON:  None. FINDINGS: Lungs are well aerated bilaterally. Left lower lobe infiltrate with associated effusion is noted. The thoracic inlet is within normal limits. The  thoracic aorta and its branches are within normal limits. Pulmonary artery demonstrates a normal branching pattern with filling defects noted in the lower lobe pulmonary arterial branches bilaterally consistent with pulmonary emboli. No findings to suggest right heart strain are seen. No hilar or mediastinal adenopathy is identified. The gallbladder has been surgically removed. The visualized upper abdomen is within normal limits. The osseous structures show no acute abnormality. Review of the MIP images confirms the above findings. IMPRESSION: Bilateral lower lobe pulmonary emboli without evidence of right heart strain. Left lower lobe infiltrate with effusion.  The possibility of pulmonary infarct would deserve consideration. Critical Value/emergent results were called by telephone at the time of interpretation on 02/23/2015 at 2:38 pm to Dr. Glori Bickers, who verbally acknowledged these results and advised patient be sent to the ER. Electronically Signed   By: Inez Catalina M.D.   On: 02/23/2015 14:44    EKG:    IMPRESSION AND PLAN:   Taylor Frederick  is a 54 y.o. female with a known history of DVT in the remote past suspected due to use of birth control pills, hypothyroidism comes to the emergency room with bilateral pleuritic chest pain 2 days ago. Patient recently had left ankle fracture which was repaired on 01/31/2015.   1. Bilateral acute pulmonary embolism. Patient is status post left ankle surgery repair 01/31/2015. She received 2 weeks of Lovenox subcutaneous for DVT prophylaxis. Patient thereafter was asked to be on aspirin which she was taking -Heparin drip was initiated by ER physician. -Given her history of DVT and now P I will order hypercoagulable workup which has been sent out. = Patient will follow-up labs with her primary care physician as outpatient. -Echo of the heart. -Start patient on eliquis treatment dose from am and if she remains hemodynamically stable she can potentially discharged tomorrow. -Incentive spirometer  2. Left ankle fracture status post surgery 01/31/2015. -Follow-up orthopedic as outpatient  3. Hypothyroidism on Synthroid  4. DVT prophylaxis patient on treatment dose with IV heparin drip   Above was discussed with patient and patient's daughter was present emergency room all questions were answered. All the records are reviewed and case discussed with ED provider. Management plans discussed with the patient, family and they are in agreement.  CODE STATUS: full  TOTAL TIME TAKING CARE OF THIS PATIENT: *45 minutes.    Taylor Frederick M.D on 02/23/2015 at 5:21 PM  Between 7am to 6pm - Pager -  [phone]  After 6pm go to [email or web address] - password [email or web address] [facility]  Office  [phone]  CC: Primary care physician; Viviana Simpler, MD

## 2015-02-23 NOTE — Telephone Encounter (Signed)
Received call with critical lab result. D. Dimer is 6.34. Pt it already being evaluated in ED.

## 2015-02-23 NOTE — Assessment & Plan Note (Addendum)
New- differential is wide and possible MSK strain from pushing manual wheelchair and sleeping in uncomfortable positions however given history and recent ankle surgery, I am concerned for PE. Need to rule out PE, CXR given history and recent surgery hospitalization. Stat labs and xray today.  Orders Placed This Encounter  Procedures  . DG Chest 2 View  . D-dimer, quantitative (not at [facility])  . CBC with Differential/Platelet  . Comprehensive metabolic panel  . Troponin I

## 2015-02-23 NOTE — Telephone Encounter (Signed)
Dr Alroy [facility]ukins from [facility] radiology called chest report Positive for PE. Dr Dayton MartesAron out of office and Dr Milinda Antisower spoke with Dr Alroy DustLukins.

## 2015-02-23 NOTE — Telephone Encounter (Signed)
.  left message to have patient return my call. Pt would need to schedule an appt

## 2015-02-24 ENCOUNTER — Inpatient Hospital Stay: Payer: BLUE CROSS/BLUE SHIELD

## 2015-02-24 ENCOUNTER — Encounter
Admission: EM | Disposition: A | Discharge status: Home / Self Care | Payer: Self-pay | Source: Home / Self Care | Attending: Internal Medicine

## 2015-02-24 ENCOUNTER — Inpatient Hospital Stay
Admit: 2015-02-24 | Discharge: 2015-02-24 | Disposition: A | Discharge status: Home / Self Care | Payer: BLUE CROSS/BLUE SHIELD | Attending: Internal Medicine | Admitting: Internal Medicine

## 2015-02-24 HISTORY — PX: PERIPHERAL VASCULAR CATHETERIZATION: SHX172C

## 2015-02-24 LAB — CBC WITH DIFFERENTIAL/PLATELET
Basophils Absolute: 0.1 10*3/uL (ref 0–0.1)
Basophils Relative: 1 %
Eosinophils Absolute: 0.4 10*3/uL (ref 0–0.7)
Eosinophils Relative: 5 %
HEMATOCRIT: 35 % (ref 35.0–47.0)
Hemoglobin: 11.7 g/dL — ABNORMAL LOW (ref 12.0–16.0)
LYMPHS ABS: 2.5 10*3/uL (ref 1.0–3.6)
LYMPHS PCT: 26 %
MCH: 29.1 pg (ref 26.0–34.0)
MCHC: 33.4 g/dL (ref 32.0–36.0)
MCV: 87.1 fL (ref 80.0–100.0)
MONO ABS: 0.6 10*3/uL (ref 0.2–0.9)
MONOS PCT: 6 %
NEUTROS ABS: 6 10*3/uL (ref 1.4–6.5)
Neutrophils Relative %: 62 %
PLATELETS: 340 10*3/uL (ref 150–440)
RBC: 4.02 MIL/uL (ref 3.80–5.20)
RDW: 13.7 % (ref 11.5–14.5)
WBC: 9.8 10*3/uL (ref 3.6–11.0)

## 2015-02-24 LAB — HEPARIN LEVEL (UNFRACTIONATED): Heparin Unfractionated: 0.39 IU/mL (ref 0.30–0.70)

## 2015-02-24 LAB — ANTITHROMBIN III: AntiThromb III Func: 107 % (ref 75–120)

## 2015-02-24 SURGERY — IVC FILTER INSERTION
Anesthesia: Moderate Sedation

## 2015-02-24 SURGERY — IVC FILTER INSERTION
Anesthesia: LOCAL

## 2015-02-24 MED ORDER — HEPARIN (PORCINE) IN NACL 2-0.9 UNIT/ML-% IJ SOLN
INTRAMUSCULAR | Status: AC
  Filled 2015-02-24: qty 500

## 2015-02-24 MED ORDER — APIXABAN 5 MG PO TABS
10.0000 mg | ORAL_TABLET | Freq: Two times a day (BID) | ORAL | Status: DC
  Administered 2015-02-24 – 2015-02-25 (×3): 10 mg via ORAL
  Filled 2015-02-24 (×3): qty 2

## 2015-02-24 MED ORDER — MIDAZOLAM HCL 2 MG/2ML IJ SOLN
INTRAMUSCULAR | Status: AC
  Filled 2015-02-24: qty 2

## 2015-02-24 MED ORDER — FENTANYL CITRATE (PF) 100 MCG/2ML IJ SOLN
INTRAMUSCULAR | Status: DC | PRN
  Administered 2015-02-24 (×2): 50 ug via INTRAVENOUS

## 2015-02-24 MED ORDER — IOHEXOL 300 MG/ML  SOLN
INTRAMUSCULAR | Status: DC | PRN
  Administered 2015-02-24: 15 mL via INTRAVENOUS

## 2015-02-24 MED ORDER — APIXABAN 5 MG PO TABS
10.0000 mg | ORAL_TABLET | Freq: Two times a day (BID) | ORAL | Status: DC
Start: 2015-02-24 — End: 2015-04-01

## 2015-02-24 MED ORDER — MIDAZOLAM HCL 2 MG/2ML IJ SOLN
INTRAMUSCULAR | Status: DC | PRN
  Administered 2015-02-24 (×2): 1 mg via INTRAVENOUS

## 2015-02-24 MED ORDER — LIDOCAINE-EPINEPHRINE (PF) 1 %-1:200000 IJ SOLN
INTRAMUSCULAR | Status: AC
  Filled 2015-02-24: qty 30

## 2015-02-24 MED ORDER — APIXABAN 5 MG PO TABS: 5.0000 mg | ORAL_TABLET | Freq: Two times a day (BID) | ORAL | Status: DC

## 2015-02-24 MED ORDER — LIDOCAINE-EPINEPHRINE (PF) 1 %-1:200000 IJ SOLN
INTRAMUSCULAR | Status: DC | PRN
  Administered 2015-02-24: 10 mL via INTRADERMAL

## 2015-02-24 MED ORDER — FENTANYL CITRATE (PF) 100 MCG/2ML IJ SOLN
INTRAMUSCULAR | Status: AC
  Filled 2015-02-24: qty 2

## 2015-02-24 SURGICAL SUPPLY — 4 items
CATH 5F KA2 (CATHETERS) ×2 IMPLANT
FILTER VC CELECT-FEMORAL (Filter) ×2 IMPLANT
PACK ANGIOGRAPHY (CUSTOM PROCEDURE TRAY) ×2 IMPLANT
WIRE J 3MM .035X145CM (WIRE) ×4 IMPLANT

## 2015-02-24 NOTE — Consult Note (Signed)
[facility] Vascular Consult Note  MRN : [id number]  Taylor Frederick is a 54 y.o. (01-21-1961) female who presents with chief complaint of  Chief Complaint  Patient presents with  . Chest Pain  .  History of Present Illness: I am asked to see the patient by Dr. Tressia Miners from the Medicine service for discussions on treatment of her DVT and pulmonary embolus. 3 weeks ago, she had a major injury to her left ankle and lower leg. This required a major surgical therapy and she is still in a cast. She was on Lovenox shots for 2 weeks after the procedure. Within the past couple of days, the patient developed chest pain and some shortness of breath and some right leg swelling. Her injury was in the left leg. She had no fevers or chills. She is breathing comfortably now but did have some labored respirations previously. She had some pain in her right upper thigh and groin area. This is largely resolved after the initiation of anticoagulation. She was found to have bilateral pulmonary emboli and extensive right lower extremity DVT throughout the visualized portion of the right lower extremity with the most proximal portion being partially occlusive and potentially mobile thrombus.  Current Facility-Administered Medications  Medication Dose Route Frequency Provider Last Rate Last Dose  . acetaminophen (TYLENOL) tablet 650 mg  650 mg Oral Q6H PRN Fritzi Mandes, MD       Or  . acetaminophen (TYLENOL) suppository 650 mg  650 mg Rectal Q6H PRN Fritzi Mandes, MD      . apixaban (ELIQUIS) tablet 10 mg  10 mg Oral BID Sheema M Hallaji, RPH   10 mg at 02/24/15 0825   Followed by  . [START ON 03/03/2015] apixaban (ELIQUIS) tablet 5 mg  5 mg Oral BID Sheema M Hallaji, RPH      . levothyroxine (SYNTHROID, LEVOTHROID) tablet 112 mcg  112 mcg Oral QAC breakfast Fritzi Mandes, MD   112 mcg at 02/24/15 0826  . morphine 2 MG/ML injection 2 mg  2 mg Intravenous Q4H PRN Fritzi Mandes, MD      .  ondansetron (ZOFRAN) tablet 4 mg  4 mg Oral Q6H PRN Fritzi Mandes, MD       Or  . ondansetron (ZOFRAN) injection 4 mg  4 mg Intravenous Q6H PRN Fritzi Mandes, MD      . oxyCODONE (Oxy IR/ROXICODONE) immediate release tablet 5-10 mg  5-10 mg Oral Q4H PRN Fritzi Mandes, MD      . senna-docusate (Senokot-S) tablet 1 tablet  1 tablet Oral QHS PRN Fritzi Mandes, MD        Past Medical History  Diagnosis Date  . Hypothyroidism   . DVT (deep venous thrombosis) ([place]) 2003    With BCP's  . Venous insufficiency   . Diffuse cystic mastopathy   . Asymptomatic varicose veins     Past Surgical History  Procedure Laterality Date  . Vein procedure      Dr. Jamal Collin  . Dilation and curettage of uterus  ~2003  . Tubal ligation  2005  . Vein closure Left 2008    Left Leg  . Orif ankle fracture Left 01/31/2015    Procedure: OPEN REDUCTION INTERNAL FIXATION (ORIF) DISTAL TIBIA  FRACTURE;  Surgeon: Corky Mull, MD;  [name];  Service: Orthopedics;  Laterality: Left;    Social History Social History  Substance Use Topics  . Smoking status: Never Smoker   . Smokeless tobacco: Never Used  .  Alcohol Use: No  No IVDU  Family History Family History  Problem Relation Age of Onset  . Heart disease Mother     CHF  . Diabetes Mother   . Hypothyroidism Mother   . Cancer Maternal Aunt     Breast and Ovarian  (Believes BRCA positive)  . Cancer Other     Breast and ovarian CA (Believes BRCA positive)  . Cancer Maternal Aunt     Melanoma    No Known Allergies   REVIEW OF SYSTEMS (Negative unless checked)  Constitutional: _0 Weight loss  _1 Fever  _2 Chills Cardiac: _3 Chest pain   _4 Chest pressure   _5 Palpitations   _6 Shortness of breath when laying flat   _7 Shortness of breath at rest   _8 Shortness of breath with exertion. Vascular:  _9 Pain in legs with walking   _10 Pain in legs at rest   _11 Pain in legs when laying flat   _12 Claudication   _13 Pain in feet when walking  _14 Pain in feet at rest  _15 Pain  in feet when laying flat   _16 History of DVT   _17 Phlebitis   _18 Swelling in legs   _19 Varicose veins   _20 Non-healing ulcers Pulmonary:   _21 Uses home oxygen   _22 Productive cough   _23 Hemoptysis   _24 Wheeze  _25 COPD   _26 Asthma Neurologic:  _27 Dizziness  _28 Blackouts   _29 Seizures   _30 History of stroke   _31 History of TIA  _32 Aphasia   _33 Temporary blindness   _34 Dysphagia   _35 Weakness or numbness in arms   _36 Weakness or numbness in legs Musculoskeletal:  _37 Arthritis   _38 Joint swelling   _39 Joint pain   _40 Low back pain Hematologic:  _41 Easy bruising  _42 Easy bleeding   _43 Hypercoagulable state   _44 Anemic  _45 Hepatitis Gastrointestinal:  _46 [name]   _47 Vomiting blood  _48 Gastroesophageal reflux/heartburn   _49 Difficulty swallowing. Genitourinary:  _50 Chronic kidney disease   _51 Difficult urination  _52 Frequent urination  _53 Burning with urination   _54 Blood in urine Skin:  _55 Rashes   _56 Ulcers   _57 Wounds Psychological:  _58 History of anxiety   _59  History of major depression.  Physical Examination  Filed Vitals:   02/24/15 0210 02/24/15 0556 02/24/15 0558 02/24/15 1311  BP: 112/57 124/98 119/71 118/68  Pulse: 91 92 92 95  Temp:  98.1 F (36.7 C)  98 F (36.7 C)  TempSrc:  Oral  Oral  Resp:  17  18  Height:      Weight:      SpO2: 97% 98%  100%   Body mass index is 31.36 kg/(m^2). Gen:  WD/WN, NAD Head: [place]/AT, No temporalis wasting. Prominent temp pulse not noted. Ear/Nose/Throat: Hearing grossly intact, nares w/o erythema or drainage, oropharynx w/o Erythema/Exudate Eyes: PERRLA, EOMI.  Neck: Supple, no nuchal rigidity.  No bruit or JVD.  Pulmonary:  Good air movement, clear to auscultation bilaterally.  Cardiac: RRR, normal S1, S2, no Murmurs, rubs or gallops. Vascular:  Vessel Right Left  Radial Palpable Palpable  Ulnar Palpable Palpable  Brachial Palpable Palpable  Carotid Palpable, without bruit Palpable, without bruit  Aorta Not palpable N/A  Femoral Palpable Palpable  Popliteal Palpable  Palpable  PT Palpable In a cast  DP Palpable In a cast   Gastrointestinal: soft, non-tender/non-distended. No guarding/reflex. No masses, surgical incisions, or scars. Musculoskeletal: M/S 5/5 throughout.  Extremities without ischemic changes.  No deformity or atrophy.1-2+ RLE swelling.  Left foot in a cast Neurologic: CN 2-12 intact. Pain and light touch intact in extremities.  Symmetrical.  Speech is fluent. Motor exam as listed above. Psychiatric: Judgment  intact, Mood & affect appropriate for pt's clinical situation. Dermatologic: No rashes or ulcers noted.  No cellulitis or open wounds. Lymph : No Cervical, Axillary, or Inguinal lymphadenopathy.       CBC Lab Results  Component Value Date   WBC 9.8 02/24/2015   HGB 11.7* 02/24/2015   HCT 35.0 02/24/2015   MCV 87.1 02/24/2015   PLT 340 02/24/2015    BMET    Component Value Date/Time   NA 133* 02/23/2015 1221   K 3.7 02/23/2015 1221   CL 98 02/23/2015 1221   CO2 28 02/23/2015 1221   GLUCOSE 102* 02/23/2015 1221   BUN 12 02/23/2015 1221   CREATININE 0.70 02/23/2015 1355   CALCIUM 9.4 02/23/2015 1221   GFRNONAA >60 02/02/2015 0834   GFRAA >60 02/02/2015 0834   Estimated Creatinine Clearance: 82.6 mL/min (by C-G formula based on Cr of 0.7).  COAG Lab Results  Component Value Date   INR 1.13 02/23/2015    Radiology Dg Chest 2 View  02/23/2015  CLINICAL DATA:  Patient has sustained a recent leg fracture and is not been bearing weight for the past 6 weeks, now with chest pain, nonsmoker. EXAM: CHEST  2 VIEW COMPARISON:  Portable chest x-ray of January 30, 2015 FINDINGS: There is a new small left pleural effusion. There is no alveolar infiltrate. The cardiac silhouette is enlarged but stable. The pulmonary vascularity is not engorged. There is stable moderate dextrocurvature centered at approximately T7. IMPRESSION: New left pleural effusion of uncertain etiology. Left lower lobe pneumonia or other parenchymal process  is not excluded. Further evaluation of the chest with CT scanning is recommended to exclude pulmonary embolism in further evaluate the left lung base. Electronically Signed   By: David  Martinique M.D.   On: 02/23/2015 12:19   Dg Knee 2 Views Left  01/30/2015  CLINICAL DATA:  [place] off a ladder with knee pain today EXAM: LEFT KNEE - 1-2 VIEW COMPARISON:  None. FINDINGS: There is a mildly comminuted fracture to the fibular head. Fracture fragments are mildly displaced. IMPRESSION: Proximal fibular fracture Electronically Signed   By: Skipper Cliche M.D.   On: 01/30/2015 16:58   Dg Tibia/fibula Left  01/31/2015  CLINICAL DATA:  Operative fixation of a distal tibia fracture. EXAM: LEFT TIBIA AND FIBULA - 2 VIEW COMPARISON:  Yesterday. FINDINGS: Five C-arm views of left lower leg demonstrate screw and plate fixation of the previously demonstrated distal tibia fracture. The fragments are in grossly anatomic position and alignment on these views. IMPRESSION: Hardware fixation of the previously seen distal fibula fracture with anatomic position and alignment on C-arm views. Electronically Signed   By: Claudie Revering M.D.   On: 01/31/2015 17:38   Dg Ankle Complete Left  01/30/2015  CLINICAL DATA:  Status post fall from a ladder today with onset of left lower leg pain. Initial encounter. EXAM: LEFT ANKLE COMPLETE - 3+ VIEW COMPARISON:  None. FINDINGS: The patient has an acute fracture of the distal tibia. The fracture extends from the medial cortex approximately 12.5 cm above the plafond in a spiral orientation through the lateral lip of the plafond. The posterior malleolus appears to be a separate fragment. The fracture is impacted approximately 2 cm. The distal fragment is medially displaced approximately 0.8 cm. No other fracture is identified. The tibiotalar joint is located. Soft tissue swelling is noted. IMPRESSION: Acute distal tibial fracture is described above. Electronically Signed   By: Inge Rise M.D.    On: 01/30/2015 16:57  Ct Angio Chest W/cm &/or Wo Cm  02/23/2015  CLINICAL DATA:  Shortness of breath and chest pain on the left EXAM: CT ANGIOGRAPHY CHEST WITH CONTRAST TECHNIQUE: Multidetector CT imaging of the chest was performed using the standard protocol during bolus administration of intravenous contrast. Multiplanar CT image reconstructions and MIPs were obtained to evaluate the vascular anatomy. CONTRAST:  182m OMNIPAQUE IOHEXOL 350 MG/ML SOLN COMPARISON:  None. FINDINGS: Lungs are well aerated bilaterally. Left lower lobe infiltrate with associated effusion is noted. The thoracic inlet is within normal limits. The thoracic aorta and its branches are within normal limits. Pulmonary artery demonstrates a normal branching pattern with filling defects noted in the lower lobe pulmonary arterial branches bilaterally consistent with pulmonary emboli. No findings to suggest right heart strain are seen. No hilar or mediastinal adenopathy is identified. The gallbladder has been surgically removed. The visualized upper abdomen is within normal limits. The osseous structures show no acute abnormality. Review of the MIP images confirms the above findings. IMPRESSION: Bilateral lower lobe pulmonary emboli without evidence of right heart strain. Left lower lobe infiltrate with effusion. The possibility of pulmonary infarct would deserve consideration. Critical Value/emergent results were called by telephone at the time of interpretation on 02/23/2015 at 2:38 pm to Dr. TGlori Bickers who verbally acknowledged these results and advised patient be sent to the ER. Electronically Signed   By: MInez CatalinaM.D.   On: 02/23/2015 14:44   UKoreaVenous Img Lower Bilateral  02/24/2015  CLINICAL DATA:  Bilateral lower extremity swelling for 2-3 days EXAM: BILATERAL LOWER EXTREMITY VENOUS DOPPLER ULTRASOUND TECHNIQUE: Gray-scale sonography with graded compression, as well as color Doppler and duplex ultrasound were performed to  evaluate the lower extremity deep venous systems from the level of the common femoral vein and including the common femoral, femoral, profunda femoral, popliteal and calf veins including the posterior tibial, peroneal and gastrocnemius veins when visible. The superficial great saphenous vein was also interrogated. Spectral Doppler was utilized to evaluate flow at rest and with distal augmentation maneuvers in the common femoral, femoral and popliteal veins. COMPARISON:  None. FINDINGS: RIGHT LOWER EXTREMITY Common Femoral Vein: Partially occlusive thrombus within the right common femoral vein. Saphenofemoral Junction: No evidence of thrombus. Normal compressibility and flow on color Doppler imaging. Profunda Femoral Vein: Occlusive thrombus noted Femoral Vein: Occlusive thrombus noted Popliteal Vein: Occlusive thrombus noted Calf Veins: Occlusive thrombus noted Superficial Great Saphenous Vein: No evidence of thrombus. Normal compressibility and flow on color Doppler imaging. Venous Reflux:  N/A Other Findings:  None. LEFT LOWER EXTREMITY Common Femoral Vein: No evidence of thrombus. Normal compressibility, respiratory phasicity and response to augmentation. Saphenofemoral Junction: No evidence of thrombus. Normal compressibility and flow on color Doppler imaging. Profunda Femoral Vein: No evidence of thrombus. Normal compressibility and flow on color Doppler imaging. Femoral Vein: No evidence of thrombus. Normal compressibility, respiratory phasicity and response to augmentation. Popliteal Vein: No evidence of thrombus. Normal compressibility, respiratory phasicity and response to augmentation. Calf Veins: Not visualized due to cast. Superficial Great Saphenous Vein: No evidence of thrombus. Normal compressibility and flow on color Doppler imaging. Venous Reflux:  None. Other Findings:  None. IMPRESSION: Partially occlusive thrombus within the right common femoral vein. Remainder of the right lower extremity deep  venous structures demonstrate occlusive thrombus. No DVT on the left. These results will be called to the ordering clinician or representative by the Radiology Department at the imaging location. Electronically Signed   By: KRolm BaptiseM.D.   On:  02/24/2015 10:51   Dg Chest Portable 1 View  01/30/2015  CLINICAL DATA:  Preoperative examination for patient with a left tibial fracture. Initial encounter. EXAM: PORTABLE CHEST 1 VIEW COMPARISON:  None. FINDINGS: The lungs are clear. Heart size is normal. No pneumothorax or pleural effusion. No focal bony abnormality. IMPRESSION: Negative chest. Electronically Signed   By: Inge Rise M.D.   On: 01/30/2015 18:25   Dg C-arm 61-120 Min-no Report  01/31/2015  CLINICAL DATA: surgery C-ARM 61-120 MINUTES Fluoroscopy was utilized by the requesting physician.  No radiographic interpretation.      Assessment/Plan 1. PE. On anticoagulation 2. Extensive RLE DVT. On anticoagulation. With her extensive DVT and already having had a PE, she is extremely concerned about further pulmonary embolus and we are asked to place an IVC filter. I have discussed with her the risks and benefits of filter placement. Her filter should clearly be removed. Given her recent major orthopedic surgery, I would not recommend thrombo-lysis or thrombectomy procedures due to the high risk of bleeding. She wants to have the IVC filter placed and this will be done today. 3. Left ankle lower leg fracture. Status post major surgical repair 3 weeks ago. This is a relative contraindication to thrombolysis and thrombectomy.  Roshawnda Pecora, MD  02/24/2015 3:53 PM

## 2015-02-24 NOTE — Progress Notes (Signed)
*  PRELIMINARY RESULTS* Echocardiogram 2D Echocardiogram has been performed.  Taylor Frederick 02/24/2015, 11:36 AM

## 2015-02-24 NOTE — Plan of Care (Signed)
Problem: Safety: Goal: Ability to remain free from injury will improve Outcome: Progressing High fall risk. Up with assist to bathroom. Cast in place to LLE. Uses front wheeled walker.   Problem: Pain Managment: Goal: General experience of comfort will improve Outcome: Progressing Pt without pain complaints. Some anxiety noted. Heparin drip infusing at 13.5. Next lab draw for dosing at 0600.   Problem: Tissue Perfusion: Goal: Risk factors for ineffective tissue perfusion will decrease Outcome: Progressing Pt with some concerns over blood pressure not being her norm. Pt given explanation. Still patient wanted her blood pressure assessed more frequent than the MD ordered. Pt wishes honored, but pt given explanation that this may cause more undue stress if parameters not within her normal range. Pt given further teaching on pulmonary embolus. This was done by providing written materials as well as explanations.  Further reassurance may be required to alleviate her anxiety.

## 2015-02-24 NOTE — Plan of Care (Signed)
Problem: Tissue Perfusion: Goal: Risk factors for ineffective tissue perfusion will decrease Outcome: Progressing Patient alert and oriented. VSS. IVC filter placed without complications. No complaints of pain. 1 assist to bathroom.

## 2015-02-24 NOTE — Discharge Summary (Signed)
[facility] [place] at [facility]   PATIENT NAME: Taylor Taylor Frederick    MR#:  [id number]  DATE OF BIRTH:  January 17, 1961  DATE OF ADMISSION:  02/23/2015 ADMITTING PHYSICIAN: Enedina FinnerSona Patel, MD  DATE OF DISCHARGE: 02/24/15  PRIMARY CARE PHYSICIAN: Tillman Abideichard Letvak, MD    ADMISSION DIAGNOSIS:  Pulmonary infarction (HCC) [I26.99] Other acute pulmonary embolism without acute cor pulmonale (HCC) [I26.99]  DISCHARGE DIAGNOSIS:  Active Problems:   PE (pulmonary embolism)   SECONDARY DIAGNOSIS:   Past Medical History  Diagnosis Date  . Hypothyroidism   . DVT (deep venous thrombosis) (HCC) 2003    With BCP's  . Venous insufficiency   . Diffuse cystic mastopathy   . Asymptomatic varicose veins     HOSPITAL COURSE:   Taylor Taylor Frederick is a 54 y.o. female with a known history of DVT in the remote past suspected due to use of birth control pills, hypothyroidism comes to the emergency room with bilateral pleuritic chest pain 2 days ago. Patient recently had left ankle fracture which was repaired on 01/31/2015.   1. Bilateral acute pulmonary embolism: Patient is status post left ankle surgery repair 01/31/2015.  -- heparin drip discontinued, started on oral eliquis -CT chest with bilateral lower lobe pulmonary emboli without right heart strain -hypercoagulable workup which has been sent out. =dopplers of lower extremities -Echo of the heart. -Incentive spirometer - possible discharge today  2. Left ankle fracture status post surgery 01/31/2015. -Follow-up orthopedic as outpatient in 2 weeks - continue weight bearing as tolerated  3. Hypothyroidism on Synthroid  Discharge today  DISCHARGE CONDITIONS:   Stable  CONSULTS OBTAINED:    None  DRUG ALLERGIES:  No Known Allergies  DISCHARGE MEDICATIONS:   Current Discharge Medication List    START taking these medications   Details  !! apixaban (ELIQUIS) 5 MG TABS tablet Take 2 tablets (10 mg total) by  mouth 2 (two) times daily. Take for a week, i.e, until 03/03/15 at the above dose. This should be followed by 1 tab PO BID after that Qty: 14 tablet, Refills: 0    !! apixaban (ELIQUIS) 5 MG TABS tablet Take 1 tablet (5 mg total) by mouth 2 (two) times daily. Start this from 03/04/15 after finishing the starter dose of 2tab PO twice a day Qty: 60 tablet, Refills: 02     !! - Potential duplicate medications found. Please discuss with provider.    CONTINUE these medications which have NOT CHANGED   Details  omeprazole (PRILOSEC) 20 MG capsule Take 1 capsule (20 mg total) by mouth daily as needed. Qty: 1 capsule, Refills: 0    oxyCODONE (OXY IR/ROXICODONE) 5 MG immediate release tablet Take 1-2 tablets (5-10 mg total) by mouth every 4 (four) hours as needed for moderate pain. Qty: 60 tablet, Refills: 0    SYNTHROID 112 MCG tablet TAKE ONE (1) TABLET EACH DAY Qty: 90 tablet, Refills: 3      STOP taking these medications     aspirin 81 MG tablet          DISCHARGE INSTRUCTIONS:   1. PCP f/u in 1-2 weeks   If you experience worsening of your admission symptoms, develop shortness of breath, life threatening emergency, suicidal or homicidal thoughts you must seek medical attention immediately by calling 911 or calling your MD immediately  if symptoms less severe.  You Must read complete instructions/literature along with all the possible adverse reactions/side effects for all the Medicines you take and that have  been prescribed to you. Take any new Medicines after you have completely understood and accept all the possible adverse reactions/side effects.   Please note  You were cared for by a hospitalist during your hospital stay. If you have any questions about your discharge medications or the care you received while you were in the hospital after you are discharged, you can call the unit and asked to speak with the hospitalist on call if the hospitalist that took care of you is not  available. Once you are discharged, your primary care physician will handle any further medical issues. Please note that NO REFILLS for any discharge medications will be authorized once you are discharged, as it is imperative that you return to your primary care physician (or establish a relationship with a primary care physician if you do not have one) for your aftercare needs so that they can reassess your need for medications and monitor your lab values.    Today   CHIEF COMPLAINT:   Chief Complaint  Patient presents with  . Chest Pain    VITAL SIGNS:  Blood pressure 119/71, pulse 92, temperature 98.1 F (36.7 C), temperature source Oral, resp. rate 17, height  (1.6 m), weight 80.287 kg (177 lb), SpO2 98 %.  I/O:  No intake or output data in the 24 hours ending 02/24/15 0948  PHYSICAL EXAMINATION:   Physical Exam  GENERAL:  54 y.o.-year-old patient lying in the bed with no acute distress.  EYES: Pupils equal, round, reactive to light and accommodation. No scleral icterus. Extraocular muscles intact.  HEENT: Head atraumatic, normocephalic. Oropharynx and nasopharynx clear.  NECK:  Supple, no jugular venous distention. No thyroid enlargement, no tenderness.  LUNGS: Normal breath sounds bilaterally, no wheezing, rales,rhonchi or crepitation. No use of accessory muscles of respiration. Decreased bibasilar breath sounds CARDIOVASCULAR: S1, S2 normal. No murmurs, rubs, or gallops.  ABDOMEN: Soft, non-tender, non-distended. Bowel sounds present. No organomegaly or mass.  EXTREMITIES: No cyanosis, or clubbing. Left leg in cast. 1+ edema of right leg noted. NEUROLOGIC: Cranial nerves II through XII are intact. Muscle strength 5/5 in all extremities. Sensation intact. Gait not checked.  PSYCHIATRIC: The patient is alert and oriented x 3.  SKIN: No obvious rash, lesion, or ulcer.   DATA REVIEW:   CBC  Recent Labs Lab 02/24/15 0613  WBC 9.8  HGB 11.7*  HCT 35.0  PLT 340     Chemistries   Recent Labs Lab 02/23/15 1221 02/23/15 1355  NA 133*  --   K 3.7  --   CL 98  --   CO2 28  --   GLUCOSE 102*  --   BUN 12  --   CREATININE 0.71 0.70  CALCIUM 9.4  --   AST 22  --   ALT 18  --   ALKPHOS 126*  --   BILITOT 0.5  --     Cardiac Enzymes No results for input(s): TROPONINI in the last 168 hours.  Microbiology Results  No results found for this or any previous visit.  RADIOLOGY:  Dg Chest 2 View  02/23/2015  CLINICAL DATA:  Patient has sustained a recent leg fracture and is not been bearing weight for the past 6 weeks, now with chest pain, nonsmoker. EXAM: CHEST  2 VIEW COMPARISON:  Portable chest x-ray of January 30, 2015 FINDINGS: There is a new small left pleural effusion. There is no alveolar infiltrate. The cardiac silhouette is enlarged but stable. The pulmonary vascularity is not  engorged. There is stable moderate dextrocurvature centered at approximately T7. IMPRESSION: New left pleural effusion of uncertain etiology. Left lower lobe pneumonia or other parenchymal process is not excluded. Further evaluation of the chest with CT scanning is recommended to exclude pulmonary embolism in further evaluate the left lung base. Electronically Signed   By: David  [place] M.D.   On: 02/23/2015 12:19   Ct Angio Chest W/cm &/or Wo Cm  02/23/2015  CLINICAL DATA:  Shortness of breath and chest pain on the left EXAM: CT ANGIOGRAPHY CHEST WITH CONTRAST TECHNIQUE: Multidetector CT imaging of the chest was performed using the standard protocol during bolus administration of intravenous contrast. Multiplanar CT image reconstructions and MIPs were obtained to evaluate the vascular anatomy. CONTRAST:  OMNIPAQUE IOHEXOL 350 MG/ML SOLN COMPARISON:  None. FINDINGS: Lungs are well aerated bilaterally. Left lower lobe infiltrate with associated effusion is noted. The thoracic inlet is within normal limits. The thoracic aorta and its branches are within normal limits.  Pulmonary artery demonstrates a normal branching pattern with filling defects noted in the lower lobe pulmonary arterial branches bilaterally consistent with pulmonary emboli. No findings to suggest right heart strain are seen. No hilar or mediastinal adenopathy is identified. The gallbladder has been surgically removed. The visualized upper abdomen is within normal limits. The osseous structures show no acute abnormality. Review of the MIP images confirms the above findings. IMPRESSION: Bilateral lower lobe pulmonary emboli without evidence of right heart strain. Left lower lobe infiltrate with effusion. The possibility of pulmonary infarct would deserve consideration. Critical Value/emergent results were called by telephone at the time of interpretation on 02/23/2015 at 2:38 pm to Dr. Milinda Antis, who verbally acknowledged these results and advised patient be sent to the ER. Electronically Signed   By: Alcide Clever M.D.   On: 02/23/2015 14:44    EKG:   Orders placed or performed during the hospital encounter of 01/30/15  . ED EKG  . ED EKG  . EKG 12-Lead  . EKG 12-Lead  . EKG      Management plans discussed with the patient, family and they are in agreement.  CODE STATUS:     Code Status Orders        Start     Ordered   02/23/15 1842  Full code   Continuous     02/23/15 1841      TOTAL TIME TAKING CARE OF THIS PATIENT: 39 minutes.    Enid Baas M.D on 02/24/2015 at 9:48 AM  Between 7am to 6pm - Pager - [phone]  After 6pm go to [email or web address] - password [email or web address] [facility]  [facility]  Office  [phone]  CC: Primary care physician; Tillman Abide, MD

## 2015-02-24 NOTE — Progress Notes (Signed)
ANTICOAGULATION CONSULT NOTE - Initial Consult  Pharmacy Consult for Apixaban Indication: pulmonary embolus  No Known Allergies Patient Measurements: Height: 5\' 3"  (160 cm) Weight: 177 lb (80.287 kg) IBW/kg (Calculated) : 52.4 Vital Signs: Temp: 98.1 F (36.7 C) (12/22 0556) Temp Source: Oral (12/22 0556) BP: 119/71 mmHg (12/22 0558) Pulse Rate: 92 (12/22 0558)  Recent Labs  02/23/15 1221 02/23/15 1355 02/23/15 1559 02/23/15 2246 02/24/15 0613  HGB 12.5  --   --   --  11.7*  HCT 37.8  --   --   --  35.0  PLT 362.0  --   --   --  340  APTT  --   --  36  --   --   LABPROT  --   --  14.7  --   --   INR  --   --  1.13  --   --   HEPARINUNFRC  --   --   --  0.27* 0.39  CREATININE 0.71 0.70  --   --   --     Estimated Creatinine Clearance: 82.6 mL/min (by C-G formula based on Cr of 0.7).  Medical History: Past Medical History  Diagnosis Date  . Hypothyroidism   . DVT (deep venous thrombosis) (HCC) 2003    With BCP's  . Venous insufficiency   . Diffuse cystic mastopathy   . Asymptomatic varicose veins    Assessment: 54 yo female with PMH  of DVT in 2003 and Lt ankle surgery approx. 4 weeks ago, presents it PE. Pharmacy has been consulted for Apixaban dosing.  Patient received enoxaparin post-op, which was D'cd about 2 weeks ago. Heparin was initiated on admission on 02/23/15.    Plan:  Will start patient on Apixaban 10mg  BID for 7 days, followed by Apixaban 5mg  BID. Heparin drip was discontinued at 0730 this morning, confirmed drip has been stopped with patient's nurse at 0800.   Pharmacy will continue to follow per consult.   Cher NakaiSheema Rajah Lamba, PharmD Pharmacy Resident  02/24/2015,8:12 AM

## 2015-02-24 NOTE — Op Note (Signed)
[facility]   OPERATIVE NOTE    PRE-OPERATIVE DIAGNOSIS: extensive RLE DVT, PE  POST-OPERATIVE DIAGNOSIS: same  PROCEDURE: 1.   Ultrasound guidance for vascular access to the left femoral vein 2.   Catheter placement into the inferior vena cava 3.   Inferior venacavogram 4.   Placement of a Cook Celect IVC filter  SURGEON: Festus BarrenJason Ettie Krontz, MD  ASSISTANT(S): None  ANESTHESIA: local/sedation  ESTIMATED BLOOD LOSS: minimal  FINDING(S): 1.  Patent IVC  SPECIMEN(S):  none  INDICATIONS:   Taylor QuickRhonda Jean Trivedi is a 54 y.o. female who presents with extensive RLE DVT and PE.  She has a large clot burden, her mobility is poor with a recent left ankle surgery, and is very anxious about the remaining clot.  Inferior vena cava filter is indicated for this reason.  Risks and benefits including filter thrombosis, migration, fracture, bleeding, and infection were all discussed.  We discussed that all IVC filters that we place can be removed if desired from the patient once the need for the filter has passed.    DESCRIPTION: After obtaining full informed written consent, the patient was brought back to the vascular suite. The skin was sterilely prepped and draped in a sterile surgical field was created. The left femoral vein was accessed under direct ultrasound guidance without difficulty with a Seldinger needle and a J-wire was then placed. After skin nick and dilatation, the delivery sheath was placed into the inferior vena cava and an inferior venacavogram was performed. This demonstrated a patent IVC with the level of the renal veins at the L1-L2 interspace with the right being lower.  The filter was then deployed into the inferior vena cava at the level of L2 just below the renal veins. The delivery sheath was then removed. Pressure was held. Sterile dressings were placed. The patient tolerated the procedure well and was taken to the recovery room in stable  condition.  COMPLICATIONS: None  CONDITION: Stable  [name]  02/24/2015, 5:33 PM

## 2015-02-24 NOTE — Telephone Encounter (Signed)
Admitted Will check on her in the hospital

## 2015-02-24 NOTE — Progress Notes (Signed)
ANTICOAGULATION CONSULT NOTE - Initial Consult  Pharmacy Consult for heparin drip  Indication: pulmonary embolus  No Known Allergies  Patient Measurements: Height: 5\' 3"  (160 cm) Weight: 177 lb (80.287 kg) IBW/kg (Calculated) : 52.4 Heparin Dosing Weight: 70.1 kg  Vital Signs: Temp: 98.1 F (36.7 C) (12/22 0556) Temp Source: Oral (12/22 0556) BP: 119/71 mmHg (12/22 0558) Pulse Rate: 92 (12/22 0558)  Labs:  Recent Labs  02/23/15 1221 02/23/15 1355 02/23/15 1559 02/23/15 2246 02/24/15 0613  HGB 12.5  --   --   --  11.7*  HCT 37.8  --   --   --  35.0  PLT 362.0  --   --   --  340  APTT  --   --  36  --   --   LABPROT  --   --  14.7  --   --   INR  --   --  1.13  --   --   HEPARINUNFRC  --   --   --  0.27* 0.39  CREATININE 0.71 0.70  --   --   --     Estimated Creatinine Clearance: 82.6 mL/min (by C-G formula based on Cr of 0.7).  Assessment: Pharmacy consulted to dose heparin drip in this 54 year old female for a pulmonary embolism. Patient has a history of DVT in 2003. Patient had left ankle surgery ~ 4 weeks ago and was taking enoxaparin, which was discontinued about 2 weeks ago. Patient was not taking any anticoagulants prior to admission.  CBC has been drawn with a Hgb of 12.5 and Plt of 362 INR and APPT ordered STAT  Heparin dosing weight = 70.1 kg  Goal of Therapy:  Heparin level 0.3-0.7 units/ml Monitor platelets by anticoagulation protocol: Yes   Plan:  Heparin level therapeutic. Continue current rate and recheck in 6 hours.   Carola FrostNathan A Brodie Scovell, Pharm.D., BCPS Clinical Pharmacist 02/24/2015,6:52 AM

## 2015-02-24 NOTE — Care Management (Signed)
Admitted to [facility] with the diagnosis of pulmonary embolism. Lives with husband, Jenny Reichmann [phone]). He works at [employer]. [employer] is listed as Insurance underwriter. Non-weight bearing on left leg from a fall/fracture. Surgery was performed per Dr. Leslye Peer 01/31/15. Follow-up appointment scheduled for 03/21/15. Possibly the cast will be removed and a boot will be applied at that appointment.  Sees Dr. Marjory Lies yesterday. Uses a rolling walker and wheelchair in the home. Husband and daughter helps Around the home.  Heparin drip converted to Eliquis. Possible discharge this afternoon per Dr. Tressia Miners. Will give Getting Started Kit for Eliquis. Shelbie Ammons RN MSN CCM Care Management [phone]

## 2015-02-24 NOTE — Progress Notes (Signed)
REported to Dr Driscilla Grammesew's nurse that patient has made a decision on an IVC filter

## 2015-02-25 ENCOUNTER — Encounter: Payer: Self-pay | Admitting: Vascular Surgery

## 2015-02-25 NOTE — Progress Notes (Signed)
Discharge instructions given and went over with patient at bedside. Prescriptions given. All questions answered. Patient to be discharged home with husband. Awaiting transportation. Bo McclintockBrewer,Jamion Carter S, RN

## 2015-02-25 NOTE — Progress Notes (Signed)
Patient discharged home with husband via wheelchair by nursing staff. Lindley Stachnik S, RN  

## 2015-02-25 NOTE — Plan of Care (Signed)
Problem: Tissue Perfusion: Goal: Risk factors for ineffective tissue perfusion will decrease Outcome: Progressing Pt denies pain. Pt has been resting comfortably during the shift. Pt requested a bath early in the shift but pt was asleep when room was entered for bath. Per pt spouse, allow pt to sleep instead of waking her for the bath. No other signs of distress noted. Will continue to monitor.

## 2015-02-25 NOTE — Progress Notes (Signed)
   02/25/15 0919  Clinical Encounter Type  Visited With Patient  Visit Type Initial  Spiritual Encounters  Spiritual Needs Prayer  Provided pastoral presence, support and prayer to patient on unit.  [employer] Automotive GroupChaplain Jayveion Stalling-pager [phone]

## 2015-02-25 NOTE — Discharge Summary (Signed)
[facility] - [place] [facility]   PATIENT NAME: Taylor Frederick    MR#:  [id number]  DATE OF BIRTH:  1960/09/09  DATE OF ADMISSION:  02/23/2015 ADMITTING PHYSICIAN: Enedina Finner, MD  DATE OF DISCHARGE: 02/25/15  PRIMARY CARE PHYSICIAN: Tillman Abide, MD    ADMISSION DIAGNOSIS:  Pulmonary infarction (HCC) [I26.99] Other acute pulmonary embolism without acute cor pulmonale (HCC) [I26.99]  DISCHARGE DIAGNOSIS:  Active Problems:   PE (pulmonary embolism)   SECONDARY DIAGNOSIS:   Past Medical History  Diagnosis Date  . Hypothyroidism   . DVT (deep venous thrombosis) (HCC) 2003    With BCP's  . Venous insufficiency   . Diffuse cystic mastopathy   . Asymptomatic varicose veins     HOSPITAL COURSE:    Taylor Frederick is a 54 y.o. female with a known history of DVT in the remote past suspected due to use of birth control pills, hypothyroidism comes to the emergency room with bilateral pleuritic chest pain 2 days ago. Patient recently had left ankle fracture which was repaired on 01/31/2015.   1. Bilateral acute pulmonary embolism: Patient is status post left ankle surgery repair 01/31/2015.  --started on oral eliquis -CT chest with bilateral lower lobe pulmonary emboli without right heart strain -hypercoagulable workup which has been sent out. =dopplers of lower extremities revealed extensive right leg DVT- so discharge canceled yesterday and vascular was consulted - patient had an IVC filter placed in and ambulating better today -Echo of the heart with normal EF and no right heart strain -Incentive spirometer  2. Left ankle fracture status post surgery 01/31/2015. -Follow-up orthopedic as outpatient in 2 weeks - continue weight bearing as tolerated - ambulating with a walker  3. Hypothyroidism on Synthroid  Discharge today  DISCHARGE CONDITIONS:   stable  CONSULTS OBTAINED:  Treatment Team:  Annice Needy, MD  DRUG ALLERGIES:  No  Known Allergies  DISCHARGE MEDICATIONS:   Current Discharge Medication List    START taking these medications   Details  !! apixaban (ELIQUIS) 5 MG TABS tablet Take 2 tablets (10 mg total) by mouth 2 (two) times daily. Take for a week, i.e, until 03/03/15 at the above dose. This should be followed by 1 tab PO BID after that Qty: 14 tablet, Refills: 0    !! apixaban (ELIQUIS) 5 MG TABS tablet Take 1 tablet (5 mg total) by mouth 2 (two) times daily. Start this from 03/04/15 after finishing the starter dose of 2tab PO twice a day Qty: 60 tablet, Refills: 02     !! - Potential duplicate medications found. Please discuss with provider.    CONTINUE these medications which have NOT CHANGED   Details  omeprazole (PRILOSEC) 20 MG capsule Take 1 capsule (20 mg total) by mouth daily as needed. Qty: 1 capsule, Refills: 0    oxyCODONE (OXY IR/ROXICODONE) 5 MG immediate release tablet Take 1-2 tablets (5-10 mg total) by mouth every 4 (four) hours as needed for moderate pain. Qty: 60 tablet, Refills: 0    SYNTHROID 112 MCG tablet TAKE ONE (1) TABLET EACH DAY Qty: 90 tablet, Refills: 3      STOP taking these medications     aspirin 81 MG tablet          DISCHARGE INSTRUCTIONS:   1. PCP f/u in 1-2 weeks 2. F/u with vascular surgery in 6 weeks  If you experience worsening of your admission symptoms, develop shortness of breath, life threatening emergency, suicidal or homicidal thoughts  you must seek medical attention immediately by calling 911 or calling your MD immediately  if symptoms less severe.  You Must read complete instructions/literature along with all the possible adverse reactions/side effects for all the Medicines you take and that have been prescribed to you. Take any new Medicines after you have completely understood and accept all the possible adverse reactions/side effects.   Please note  You were cared for by a hospitalist during your hospital stay. If you have any  questions about your discharge medications or the care you received while you were in the hospital after you are discharged, you can call the unit and asked to speak with the hospitalist on call if the hospitalist that took care of you is not available. Once you are discharged, your primary care physician will handle any further medical issues. Please note that NO REFILLS for any discharge medications will be authorized once you are discharged, as it is imperative that you return to your primary care physician (or establish a relationship with a primary care physician if you do not have one) for your aftercare needs so that they can reassess your need for medications and monitor your lab values.    Today   CHIEF COMPLAINT:   Chief Complaint  Patient presents with  . Chest Pain     VITAL SIGNS:  Blood pressure 122/68, pulse 88, temperature 98.2 F (36.8 C), temperature source Oral, resp. rate 20, height  (1.6 m), weight 80.287 kg (177 lb), SpO2 97 %.  I/O:   Intake/Output Summary (Last 24 hours) at 02/25/15 1107 Last data filed at 02/24/15 1400  Gross per 24 hour  Intake      0 ml  Output    300 ml  Net   -300 ml    PHYSICAL EXAMINATION:   Physical Exam  GENERAL: 54 y.o.-year-old patient lying in the bed with no acute distress.  EYES: Pupils equal, round, reactive to light and accommodation. No scleral icterus. Extraocular muscles intact.  HEENT: Head atraumatic, normocephalic. Oropharynx and nasopharynx clear.  NECK: Supple, no jugular venous distention. No thyroid enlargement, no tenderness.  LUNGS: Normal breath sounds bilaterally, no wheezing, rales,rhonchi or crepitation. No use of accessory muscles of respiration. Decreased bibasilar breath sounds CARDIOVASCULAR: S1, S2 normal. No murmurs, rubs, or gallops.  ABDOMEN: Soft, non-tender, non-distended. Bowel sounds present. No organomegaly or mass.  EXTREMITIES: No cyanosis, or clubbing. Left leg in cast. 1+  edema of right leg noted. NEUROLOGIC: Cranial nerves II through XII are intact. Muscle strength 5/5 in all extremities. Sensation intact. Gait not checked.  PSYCHIATRIC: The patient is alert and oriented x 3.  SKIN: No obvious rash, lesion, or ulcer.   DATA REVIEW:   CBC  Recent Labs Lab 02/24/15 0613  WBC 9.8  HGB 11.7*  HCT 35.0  PLT 340    Chemistries   Recent Labs Lab 02/23/15 1221 02/23/15 1355  NA 133*  --   K 3.7  --   CL 98  --   CO2 28  --   GLUCOSE 102*  --   BUN 12  --   CREATININE 0.71 0.70  CALCIUM 9.4  --   AST 22  --   ALT 18  --   ALKPHOS 126*  --   BILITOT 0.5  --     Cardiac Enzymes No results for input(s): TROPONINI in the last 168 hours.  Microbiology Results  No results found for this or any previous visit.  RADIOLOGY:  Dg Chest 2 View  02/23/2015  CLINICAL DATA:  Patient has sustained a recent leg fracture and is not been bearing weight for the past 6 weeks, now with chest pain, nonsmoker. EXAM: CHEST  2 VIEW COMPARISON:  Portable chest x-ray of January 30, 2015 FINDINGS: There is a new small left pleural effusion. There is no alveolar infiltrate. The cardiac silhouette is enlarged but stable. The pulmonary vascularity is not engorged. There is stable moderate dextrocurvature centered at approximately T7. IMPRESSION: New left pleural effusion of uncertain etiology. Left lower lobe pneumonia or other parenchymal process is not excluded. Further evaluation of the chest with CT scanning is recommended to exclude pulmonary embolism in further evaluate the left lung base. Electronically Signed   By: David  [place]and M.D.   On: 02/23/2015 12:19   Ct Angio Chest W/cm &/or Wo Cm  02/23/2015  CLINICAL DATA:  Shortness of breath and chest pain on the left EXAM: CT ANGIOGRAPHY CHEST WITH CONTRAST TECHNIQUE: Multidetector CT imaging of the chest was performed using the standard protocol during bolus administration of intravenous contrast. Multiplanar CT  image reconstructions and MIPs were obtained to evaluate the vascular anatomy. CONTRAST:  OMNIPAQUE IOHEXOL 350 MG/ML SOLN COMPARISON:  None. FINDINGS: Lungs are well aerated bilaterally. Left lower lobe infiltrate with associated effusion is noted. The thoracic inlet is within normal limits. The thoracic aorta and its branches are within normal limits. Pulmonary artery demonstrates a normal branching pattern with filling defects noted in the lower lobe pulmonary arterial branches bilaterally consistent with pulmonary emboli. No findings to suggest right heart strain are seen. No hilar or mediastinal adenopathy is identified. The gallbladder has been surgically removed. The visualized upper abdomen is within normal limits. The osseous structures show no acute abnormality. Review of the MIP images confirms the above findings. IMPRESSION: Bilateral lower lobe pulmonary emboli without evidence of right heart strain. Left lower lobe infiltrate with effusion. The possibility of pulmonary infarct would deserve consideration. Critical Value/emergent results were called by telephone at the time of interpretation on 02/23/2015 at 2:38 pm to Dr. Milinda Antis, who verbally acknowledged these results and advised patient be sent to the ER. Electronically Signed   By: Alcide Clever M.D.   On: 02/23/2015 14:44   US Venous Img Lower Bilateral  02/24/2015  CLINICAL DATA:  Bilateral lower extremity swelling for 2-3 days EXAM: BILATERAL LOWER EXTREMITY VENOUS DOPPLER ULTRASOUND TECHNIQUE: Gray-scale sonography with graded compression, as well as color Doppler and duplex ultrasound were performed to evaluate the lower extremity deep venous systems from the level of the common femoral vein and including the common femoral, femoral, profunda femoral, popliteal and calf veins including the posterior tibial, peroneal and gastrocnemius veins when visible. The superficial great saphenous vein was also interrogated. Spectral Doppler was  utilized to evaluate flow at rest and with distal augmentation maneuvers in the common femoral, femoral and popliteal veins. COMPARISON:  None. FINDINGS: RIGHT LOWER EXTREMITY Common Femoral Vein: Partially occlusive thrombus within the right common femoral vein. Saphenofemoral Junction: No evidence of thrombus. Normal compressibility and flow on color Doppler imaging. Profunda Femoral Vein: Occlusive thrombus noted Femoral Vein: Occlusive thrombus noted Popliteal Vein: Occlusive thrombus noted Calf Veins: Occlusive thrombus noted Superficial Great Saphenous Vein: No evidence of thrombus. Normal compressibility and flow on color Doppler imaging. Venous Reflux:  N/A Other Findings:  None. LEFT LOWER EXTREMITY Common Femoral Vein: No evidence of thrombus. Normal compressibility, respiratory phasicity and response to augmentation. Saphenofemoral Junction: No evidence of thrombus. Normal  compressibility and flow on color Doppler imaging. Profunda Femoral Vein: No evidence of thrombus. Normal compressibility and flow on color Doppler imaging. Femoral Vein: No evidence of thrombus. Normal compressibility, respiratory phasicity and response to augmentation. Popliteal Vein: No evidence of thrombus. Normal compressibility, respiratory phasicity and response to augmentation. Calf Veins: Not visualized due to cast. Superficial Great Saphenous Vein: No evidence of thrombus. Normal compressibility and flow on color Doppler imaging. Venous Reflux:  None. Other Findings:  None. IMPRESSION: Partially occlusive thrombus within the right common femoral vein. Remainder of the right lower extremity deep venous structures demonstrate occlusive thrombus. No DVT on the left. These results will be called to the ordering clinician or representative by the Radiology Department at the imaging location. Electronically Signed   By: Charlett NoseKevin  Dover M.D.   On: 02/24/2015 10:51    EKG:   Orders placed or performed during the hospital encounter  of 01/30/15  . ED EKG  . ED EKG  . EKG 12-Lead  . EKG 12-Lead  . EKG      Management plans discussed with the patient, family and they are in agreement.  CODE STATUS:     Code Status Orders        Start     Ordered   02/23/15 1842  Full code   Continuous     02/23/15 1841      TOTAL TIME TAKING CARE OF THIS PATIENT: 37 minutes.    Enid BaasKALISETTI,Travis Mastel M.D on 02/25/2015 at 11:07 AM  Between 7am to 6pm - Pager - [phone]  After 6pm go to [email or web address] - password [email or web address] [facility]  [facility]  Office  [phone]  CC: Primary care physician; Tillman Abideichard Letvak, MD

## 2015-03-01 ENCOUNTER — Encounter: Payer: Self-pay | Admitting: Internal Medicine

## 2015-03-01 ENCOUNTER — Ambulatory Visit (INDEPENDENT_AMBULATORY_CARE_PROVIDER_SITE_OTHER): Payer: BLUE CROSS/BLUE SHIELD | Admitting: Internal Medicine

## 2015-03-01 VITALS — BP 110/70 | HR 86 | Temp 98.0°F

## 2015-03-01 DIAGNOSIS — I2699 Other pulmonary embolism without acute cor pulmonale: Secondary | ICD-10-CM

## 2015-03-01 DIAGNOSIS — I82401 Acute embolism and thrombosis of unspecified deep veins of right lower extremity: Secondary | ICD-10-CM | POA: Diagnosis not present

## 2015-03-01 DIAGNOSIS — I82409 Acute embolism and thrombosis of unspecified deep veins of unspecified lower extremity: Secondary | ICD-10-CM | POA: Insufficient documentation

## 2015-03-01 NOTE — Progress Notes (Signed)
Subjective:    Patient ID: Taylor Frederick, female    DOB: 1960/06/08, 54 y.o.   MRN: [id number]  [facility] follow up after DVT/PE Reviewed records and imaging studies--PE/effusion and DVT on right (extensive) IVC filter placed  Feels a little bit better Breathing is pretty good--but gets DOE when up on walker Still on no weight bearing status for broken left ankle Slight cough-- dry  No fever  Got sense of swelling in right leg yesterday Has kept it elevated Tight feeling--persistence today  Goes back to ortho 1/16 Hoping to get cast off and get to brace Hopes to do this sooner  Current Outpatient Prescriptions on File Prior to Visit  Medication Sig Dispense Refill  . apixaban (ELIQUIS) 5 MG TABS tablet Take 2 tablets (10 mg total) by mouth 2 (two) times daily. Take for a week, i.e, until 03/03/15 at the above dose. This should be followed by 1 tab PO BID after that 14 tablet 0  . [START ON 03/04/2015] apixaban (ELIQUIS) 5 MG TABS tablet Take 1 tablet (5 mg total) by mouth 2 (two) times daily. Start this from 03/04/15 after finishing the starter dose of 2tab PO twice a day 60 tablet 02  . omeprazole (PRILOSEC) 20 MG capsule Take 1 capsule (20 mg total) by mouth daily as needed. 1 capsule 0  . SYNTHROID 112 MCG tablet TAKE ONE (1) TABLET EACH DAY 90 tablet 3   No current facility-administered medications on file prior to visit.    No Known Allergies  Past Medical History  Diagnosis Date  . Hypothyroidism   . DVT (deep venous thrombosis) ([place]) 2003    With BCP's  . Venous insufficiency   . Diffuse cystic mastopathy   . Asymptomatic varicose veins     Past Surgical History  Procedure Laterality Date  . Vein procedure      Dr. Jamal Collin  . Dilation and curettage of uterus  ~2003  . Tubal ligation  2005  . Vein closure Left 2008    Left Leg  . Orif ankle fracture Left 01/31/2015    Procedure: OPEN REDUCTION INTERNAL FIXATION (ORIF) DISTAL TIBIA  FRACTURE;   Surgeon: Corky Mull, MD;  [name];  Service: Orthopedics;  Laterality: Left;  . Peripheral vascular catheterization N/A 02/24/2015    Procedure: IVC Filter Insertion;  Surgeon: Algernon Huxley, MD;  Location: [place] [facility];  Service: Cardiovascular;  [name];    Family History  Problem Relation Age of Onset  . Heart disease Mother     CHF  . Diabetes Mother   . Hypothyroidism Mother   . Cancer Maternal Aunt     Breast and Ovarian  (Believes BRCA positive)  . Cancer Other     Breast and ovarian CA (Believes BRCA positive)  . Cancer Maternal Aunt     Melanoma    Social History   Social History  . Marital Status: Married    Spouse Name: N/A  . Number of Children: 1  . Years of Education: N/A   Occupational History  . Merchant [employer] officer  .           Social History Main Topics  . Smoking status: Never Smoker   . Smokeless tobacco: Never Used  . Alcohol Use: No  . Drug Use: No  . Sexual Activity: Not on file   Other Topics Concern  . Not on file   Social History Narrative  Review of Systems  No bleeding problems-- no blood with brushing teeth, or blood in stool/urine No bruising Appetite is fair Sleeping okay--has to prop some     Objective:   Physical Exam  Cardiovascular: Normal rate, regular rhythm and normal heart sounds.  Exam reveals no gallop.   No murmur heard. Pulmonary/Chest: Effort normal. No respiratory distress. She has no wheezes. She has no rales.  Decreased breath sounds and dullness at left base  Musculoskeletal:  Right calf slightly swollen and slightly tense but not tender Slight tenderness in upper medial right thigh          Assessment & Plan:

## 2015-03-01 NOTE — Assessment & Plan Note (Signed)
On eliquis now Still with effusion Will try to stay active, deep breathing ---to reduce pneumonia risk. Discussed signs of infection for quick action Will plan to recheck CXR in about a month

## 2015-03-01 NOTE — Progress Notes (Signed)
Pre visit review using our clinic review tool, if applicable. No additional management support is needed unless otherwise documented below in the visit note. 

## 2015-03-01 NOTE — Assessment & Plan Note (Signed)
High risk so filter placed Dr Wyn Quakerew plans to remove this in ?3 months On the eliquis

## 2015-03-02 ENCOUNTER — Encounter: Payer: Self-pay | Admitting: *Deleted

## 2015-03-02 LAB — FACTOR 5 LEIDEN

## 2015-03-02 LAB — PROTHROMBIN GENE MUTATION

## 2015-03-02 LAB — LUPUS ANTICOAGULANT
DPT CONFIRM RATIO: 0.9 ratio (ref 0.00–1.40)
DPT: 50.2 s (ref 0.0–55.0)
DRVVT: 39.4 s (ref 0.0–44.0)
PTT LA: 41.6 s — AB (ref 0.0–40.6)
THROMBIN TIME: 14.2 s (ref 0.0–20.9)

## 2015-03-02 LAB — BETA-2-GLYCOPROTEIN I ABS, IGG/M/A
Beta-2-Glycoprotein I IgA: <9 GPI IgA units (ref 0–25)
Beta-2-Glycoprotein I IgM: <9 GPI IgM units (ref 0–32)

## 2015-03-02 LAB — PROTEIN S ACTIVITY: Protein S Activity: 47 % — ABNORMAL LOW (ref 63–140)

## 2015-03-02 LAB — PTT-LA MIX: PTT-LA Mix: 42.1 s — ABNORMAL HIGH (ref 0.0–40.6)

## 2015-03-02 LAB — HEXAGONAL PHASE PHOSPHOLIPID: Hexagonal Phase Phospholipid: 24 s — ABNORMAL HIGH (ref 0–11)

## 2015-03-02 LAB — PROTEIN C ACTIVITY: Protein C Activity: 133 % (ref 73–180)

## 2015-03-22 ENCOUNTER — Telehealth: Payer: Self-pay

## 2015-03-22 NOTE — Telephone Encounter (Signed)
Pt called for refill eliquis; from med list pt should have refills from Dr Devin Going; spoke with Misty Stanley at [facility] and pt does have available refills at this time. Pt will contact [facility].

## 2015-03-28 ENCOUNTER — Telehealth: Payer: Self-pay

## 2015-03-28 NOTE — Telephone Encounter (Signed)
Pt left v/m requesting cb [facility].left v/m requesting cb.

## 2015-04-01 ENCOUNTER — Encounter: Payer: Self-pay | Admitting: Internal Medicine

## 2015-04-01 ENCOUNTER — Ambulatory Visit (INDEPENDENT_AMBULATORY_CARE_PROVIDER_SITE_OTHER): Payer: BLUE CROSS/BLUE SHIELD | Admitting: Internal Medicine

## 2015-04-01 VITALS — BP 120/70 | HR 93 | Temp 98.3°F | Wt 176.0 lb

## 2015-04-01 DIAGNOSIS — I2699 Other pulmonary embolism without acute cor pulmonale: Secondary | ICD-10-CM

## 2015-04-01 MED ORDER — APIXABAN 5 MG PO TABS: 5.0000 mg | ORAL_TABLET | Freq: Two times a day (BID) | ORAL | Status: DC

## 2015-04-01 NOTE — Progress Notes (Signed)
Pre visit review using our clinic review tool, if applicable. No additional management support is needed unless otherwise documented below in the visit note. 

## 2015-04-01 NOTE — Progress Notes (Signed)
Subjective:    Patient ID: Taylor Frederick, female    DOB: 24-Sep-1960, 55 y.o.   MRN: [id number]  HPI Here for follow up of pulmonary embolus  Doing better Still not back to work--only allowed 50% weight on foot for another 3 weeks Using walker Has just started therapy In boot now  Breathing seems to be back to normal Ongoing leg swelling---especially after being up and down some  Current Outpatient Prescriptions on File Prior to Visit  Medication Sig Dispense Refill  . apixaban (ELIQUIS) 5 MG TABS tablet Take 1 tablet (5 mg total) by mouth 2 (two) times daily. Start this from 03/04/15 after finishing the starter dose of 2tab PO twice a day 60 tablet 02  . omeprazole (PRILOSEC) 20 MG capsule Take 1 capsule (20 mg total) by mouth daily as needed. 1 capsule 0  . SYNTHROID 112 MCG tablet TAKE ONE (1) TABLET EACH DAY 90 tablet 3   No current facility-administered medications on file prior to visit.    No Known Allergies  Past Medical History  Diagnosis Date  . Hypothyroidism   . DVT (deep venous thrombosis) ([place]) 2003    With BCP's  . Venous insufficiency   . Diffuse cystic mastopathy   . Asymptomatic varicose veins     Past Surgical History  Procedure Laterality Date  . Vein procedure      Dr. Jamal Collin  . Dilation and curettage of uterus  ~2003  . Tubal ligation  2005  . Vein closure Left 2008    Left Leg  . Orif ankle fracture Left 01/31/2015    Procedure: OPEN REDUCTION INTERNAL FIXATION (ORIF) DISTAL TIBIA  FRACTURE;  Surgeon: Corky Mull, MD;  [name];  Service: Orthopedics;  Laterality: Left;  . Peripheral vascular catheterization N/A 02/24/2015    Procedure: IVC Filter Insertion;  Surgeon: Algernon Huxley, MD;  Location: [place] [facility];  Service: Cardiovascular;  [name];    Family History  Problem Relation Age of Onset  . Heart disease Mother     CHF  . Diabetes Mother   . Hypothyroidism Mother   . Cancer Maternal Aunt    Breast and Ovarian  (Believes BRCA positive)  . Cancer Other     Breast and ovarian CA (Believes BRCA positive)  . Cancer Maternal Aunt     Melanoma    Social History   Social History  . Marital Status: Married    Spouse Name: N/A  . Number of Children: 1  . Years of Education: N/A   Occupational History  . Merchant navy officer  .           Social History Main Topics  . Smoking status: Never Smoker   . Smokeless tobacco: Never Used  . Alcohol Use: No  . Drug Use: No  . Sexual Activity: Not on file   Other Topics Concern  . Not on file   Social History Narrative   Review of Systems  Appetite is okay Sleeping okay     Objective:   Physical Exam  Constitutional: She appears well-developed and well-nourished. No distress.  Cardiovascular: Normal rate, regular rhythm and normal heart sounds.  Exam reveals no gallop.   No murmur heard. Pulmonary/Chest: Effort normal and breath sounds normal. No respiratory distress. She has no wheezes. She has no rales.  Musculoskeletal:  No calf tenderness or sig swelling          Assessment &  Plan:

## 2015-04-01 NOTE — Assessment & Plan Note (Signed)
No symptoms now Has IVC filter in place I would be inclined to continue the eliquis for 3 months after she becomes full weight bearing Does have vascular follow up coming up next week---may check legs for DVT

## 2015-05-04 NOTE — Telephone Encounter (Signed)
Pt was seen on 04/01/15 and refill done.

## 2015-06-10 DIAGNOSIS — M6281 Muscle weakness (generalized): Secondary | ICD-10-CM | POA: Diagnosis not present

## 2015-06-14 ENCOUNTER — Other Ambulatory Visit: Payer: Self-pay | Admitting: Vascular Surgery

## 2015-06-14 DIAGNOSIS — I8010 Phlebitis and thrombophlebitis of unspecified femoral vein: Secondary | ICD-10-CM | POA: Diagnosis not present

## 2015-06-20 ENCOUNTER — Other Ambulatory Visit: Payer: Self-pay | Admitting: Vascular Surgery

## 2015-06-20 ENCOUNTER — Encounter
Admission: RE | Disposition: A | Discharge status: Home / Self Care | Payer: Self-pay | Source: Ambulatory Visit | Attending: Vascular Surgery

## 2015-06-20 ENCOUNTER — Ambulatory Visit
Admission: RE | Admit: 2015-06-20 | Discharge: 2015-06-20 | Disposition: A | Discharge status: Home / Self Care | Payer: BLUE CROSS/BLUE SHIELD | Source: Ambulatory Visit | Attending: Vascular Surgery | Admitting: Vascular Surgery

## 2015-06-20 DIAGNOSIS — Z9889 Other specified postprocedural states: Secondary | ICD-10-CM | POA: Insufficient documentation

## 2015-06-20 DIAGNOSIS — Z79899 Other long term (current) drug therapy: Secondary | ICD-10-CM | POA: Diagnosis not present

## 2015-06-20 DIAGNOSIS — I82409 Acute embolism and thrombosis of unspecified deep veins of unspecified lower extremity: Secondary | ICD-10-CM | POA: Insufficient documentation

## 2015-06-20 DIAGNOSIS — M7989 Other specified soft tissue disorders: Secondary | ICD-10-CM | POA: Diagnosis not present

## 2015-06-20 DIAGNOSIS — Z8249 Family history of ischemic heart disease and other diseases of the circulatory system: Secondary | ICD-10-CM | POA: Diagnosis not present

## 2015-06-20 DIAGNOSIS — I743 Embolism and thrombosis of arteries of the lower extremities: Secondary | ICD-10-CM | POA: Diagnosis not present

## 2015-06-20 HISTORY — PX: PERIPHERAL VASCULAR CATHETERIZATION: SHX172C

## 2015-06-20 SURGERY — IVC FILTER REMOVAL
Anesthesia: Moderate Sedation

## 2015-06-20 MED ORDER — MORPHINE SULFATE (PF) 4 MG/ML IV SOLN: 2.0000 mg | INTRAVENOUS | Status: DC | PRN

## 2015-06-20 MED ORDER — OXYCODONE-ACETAMINOPHEN 5-325 MG PO TABS: 1.0000 | ORAL_TABLET | ORAL | Status: DC | PRN

## 2015-06-20 MED ORDER — MIDAZOLAM HCL 2 MG/2ML IJ SOLN
INTRAMUSCULAR | Status: AC
  Filled 2015-06-20: qty 2

## 2015-06-20 MED ORDER — ACETAMINOPHEN 325 MG PO TABS: 325.0000 mg | ORAL_TABLET | ORAL | Status: DC | PRN

## 2015-06-20 MED ORDER — HYDROMORPHONE HCL 1 MG/ML IJ SOLN: 1.0000 mg | Freq: Once | INTRAMUSCULAR | Status: DC

## 2015-06-20 MED ORDER — METOPROLOL TARTRATE 1 MG/ML IV SOLN: 2.0000 mg | INTRAVENOUS | Status: DC | PRN

## 2015-06-20 MED ORDER — MIDAZOLAM HCL 2 MG/2ML IJ SOLN
INTRAMUSCULAR | Status: DC | PRN
  Administered 2015-06-20: 2 mg via INTRAVENOUS

## 2015-06-20 MED ORDER — FENTANYL CITRATE (PF) 100 MCG/2ML IJ SOLN
INTRAMUSCULAR | Status: AC
Start: 2015-06-20 — End: 2015-06-20
  Filled 2015-06-20: qty 2

## 2015-06-20 MED ORDER — LIDOCAINE-EPINEPHRINE (PF) 1 %-1:200000 IJ SOLN
INTRAMUSCULAR | Status: AC
Start: 2015-06-20 — End: 2015-06-20
  Filled 2015-06-20: qty 30

## 2015-06-20 MED ORDER — HEPARIN (PORCINE) IN NACL 2-0.9 UNIT/ML-% IJ SOLN
INTRAMUSCULAR | Status: AC
  Filled 2015-06-20: qty 500

## 2015-06-20 MED ORDER — ONDANSETRON HCL 4 MG/2ML IJ SOLN
4.0000 mg | Freq: Four times a day (QID) | INTRAMUSCULAR | Status: DC | PRN
Start: 2015-06-20 — End: 2015-06-22

## 2015-06-20 MED ORDER — FENTANYL CITRATE (PF) 100 MCG/2ML IJ SOLN
INTRAMUSCULAR | Status: DC | PRN
  Administered 2015-06-20: 50 ug via INTRAVENOUS

## 2015-06-20 MED ORDER — ACETAMINOPHEN 325 MG RE SUPP
325.0000 mg | RECTAL | Status: DC | PRN
  Filled 2015-06-20: qty 2

## 2015-06-20 MED ORDER — ALUM & MAG HYDROXIDE-SIMETH 200-200-20 MG/5ML PO SUSP: 15.0000 mL | ORAL | Status: DC | PRN

## 2015-06-20 MED ORDER — GUAIFENESIN-DM 100-10 MG/5ML PO SYRP
15.0000 mL | ORAL_SOLUTION | ORAL | Status: DC | PRN
  Filled 2015-06-20: qty 15

## 2015-06-20 MED ORDER — FENTANYL CITRATE (PF) 100 MCG/2ML IJ SOLN
INTRAMUSCULAR | Status: AC
  Filled 2015-06-20: qty 2

## 2015-06-20 MED ORDER — SODIUM CHLORIDE 0.9 % IV SOLN
INTRAVENOUS | Status: DC
  Administered 2015-06-20: 09:00:00 via INTRAVENOUS

## 2015-06-20 MED ORDER — HYDRALAZINE HCL 20 MG/ML IJ SOLN: 5.0000 mg | INTRAMUSCULAR | Status: DC | PRN

## 2015-06-20 MED ORDER — ONDANSETRON HCL 4 MG/2ML IJ SOLN: 4.0000 mg | Freq: Four times a day (QID) | INTRAMUSCULAR | Status: DC | PRN

## 2015-06-20 MED ORDER — SODIUM CHLORIDE 0.9 % IV SOLN: 500.0000 mL | Freq: Once | INTRAVENOUS | Status: DC | PRN

## 2015-06-20 MED ORDER — LIDOCAINE-EPINEPHRINE (PF) 1 %-1:200000 IJ SOLN
INTRAMUSCULAR | Status: DC | PRN
  Administered 2015-06-20: 10 mL via INTRADERMAL

## 2015-06-20 MED ORDER — LABETALOL HCL 5 MG/ML IV SOLN: 10.0000 mg | INTRAVENOUS | Status: DC | PRN

## 2015-06-20 MED ORDER — DEXTROSE 5 % IV SOLN
1.5000 g | INTRAVENOUS | Status: AC
  Administered 2015-06-20: 1.5 g via INTRAVENOUS

## 2015-06-20 MED ORDER — PHENOL 1.4 % MT LIQD
1.0000 | OROMUCOSAL | Status: DC | PRN
  Filled 2015-06-20: qty 177

## 2015-06-20 MED ORDER — IOPAMIDOL (ISOVUE-300) INJECTION 61%
INTRAVENOUS | Status: DC | PRN
  Administered 2015-06-20: 10 mL via INTRA_ARTERIAL

## 2015-06-20 SURGICAL SUPPLY — 4 items
PACK ANGIOGRAPHY (CUSTOM PROCEDURE TRAY) ×2 IMPLANT
SET VENACAVA FILTER RETRIEVAL (MISCELLANEOUS) ×2 IMPLANT
TOWEL OR 17X26 4PK STRL BLUE (TOWEL DISPOSABLE) ×2 IMPLANT
WIRE J 3MM .035X145CM (WIRE) ×2 IMPLANT

## 2015-06-20 NOTE — Discharge Instructions (Signed)
Inferior Vena Cava Filter removal, Care After Refer to this sheet in the next few weeks. These instructions provide you with information on caring for yourself after your procedure. Your health care provider may also give you more specific instructions. Your treatment has been planned according to current medical practices, but problems sometimes occur. Call your health care provider if you have any problems or questions after your procedure. WHAT TO EXPECT AFTER THE PROCEDURE After your procedure, it is typical to have the following:  Mild pain in the area where the filter was removed.  Mild bruising in the area where the filter was removed. HOME CARE INSTRUCTIONS  Only take over-the-counter or prescription medicines for pain, fever, or discomfort as directed by your health care provider.  A bandage (dressing) has been placed over the insertion site. Remove tomorrow. Follow your health care provider's instructions on how to care for it.  Keep the insertion site clean and dry.  Do not soak in a bath tub or pool until the filter insertion site has healed.  Do not drive if you are taking narcotic pain medicines. Follow your health care provider's instructions about driving.  Do not return to work or school until tomorrow.   Keep all follow-up appointments.  SEEK IMMEDIATE MEDICAL CARE IF:  You develop shortness of breath, feel faint, or pass out.  You develop chest pain, a cough, or difficulty breathing.  You cough up blood.  You develop a rash or feel you are having problems that may be a side effect of medicines.  You develop weakness, difficulty moving your arms or legs, or balance problems.  You develop problems with speech or vision.   This information is not intended to replace advice given to you by your health care provider. Make sure you discuss any questions you have with your health care provider.   Document Released: 12/10/2012 Document Reviewed:  12/10/2012 Elsevier Interactive Patient Education [employer]2016 Elsevier Inc.

## 2015-06-20 NOTE — OR Nursing (Signed)
Dr Wyn Quakerew discussed procedure with patient and family

## 2015-06-20 NOTE — H&P (Signed)
  [facility] History & Physical Update  The patient was interviewed and re-examined.  The patient's previous History and Physical has been reviewed and is unchanged.  There is no change in the plan of care. We plan to proceed with the scheduled procedure.  Montserrath Madding, MD  06/20/2015, 9:24 AM

## 2015-06-20 NOTE — Op Note (Signed)
    OPERATIVE NOTE   PROCEDURE: 1. Ultrasound guidance for vascular access to right jugular vein. 2. Catheter placement into IVC from right jugular vein. 3. Inferior venacavogram. 4. Retrieval of IVC filter.  PRE-OPERATIVE DIAGNOSIS: 1. Status post IVC filter for previous DVT    POST-OPERATIVE DIAGNOSIS: Same as above  SURGEON: Festus BarrenJason Olanna Percifield, MD  ASSISTANT(S): None  ANESTHESIA: local with Moderate Conscious Sedation for approximately 20 minutes using 3 mg of Versed and 150 mcg of Fentanyl  ESTIMATED BLOOD LOSS: Minimal  CONTRAST: 10 cc  FLUORO TIME: 0.6 minutes  FINDING(S): 1. Patent IVC  SPECIMEN(S): Filter retrieved and disposed of  INDICATIONS:  Patient is a 55 y.o.female who presents with a previous history of IVC filter placement for extensive DVT after surgery.  The patient desires removal of the filter to avoid the small lifetime risks of perforation, infection, thrombosis, migration, and stent fracture.  Risks and benefits of removal were discussed and the patient is agreeable to proceed.  The patient understands that the filter may not be able to be retrieved if the top of the filter has embedded in the caval wall.   DESCRIPTION: After obtaining full informed written consent, the patient was brought back to the operating room and placed supine upon the operating table. After obtaining adequate sedation, the patient was prepped and draped in the standard fashion. Moderate conscious sedation was administered during the face to face encounter with the patient throughout the procedure with my supervision of the RN administering medicines and monitoring the patient's vital signs, pulse oximetry, telemetry and mental status throughout from the start of the procedure until the patient was taken to the recovery room.  The right jugular vein was visualized with ultrasound and found to be widely patent. This was accessed under direct ultrasound guidance with a Seldinger needle  and a permanent image was recorded. A J-wire was then placed. After skin nick and dilatation, the retrieval sheath was placed over the wire into the inferior vena cava. Inferior venacavogram was then performed. The IVC was found to be widely patent and the filter was in good location. I then was able to snare the hook on the top of the filter and advanced the sheath over the filter collapsing it and bringing it into the sheath in its entirety. It was then removed through the sheath in its entirety. The sheath was then removed and pressure was held on the neck. The patient tolerated the procedure well and was taken to the recovery room in stable condition.  COMPLICATIONS: None  CONDITION: Stable   Odette Watanabe 06/20/2015 1:06 PM

## 2015-06-21 ENCOUNTER — Encounter: Payer: Self-pay | Admitting: Vascular Surgery

## 2015-06-21 NOTE — Procedures (Signed)
Post procedure phone call completed.  Pt doing well

## 2015-06-28 ENCOUNTER — Telehealth: Payer: Self-pay

## 2015-06-28 NOTE — Telephone Encounter (Signed)
Pt will contact [facility].

## 2015-06-28 NOTE — Telephone Encounter (Signed)
Pt left v/m requesting refill eliquis to [facility]; left v/m requesting pt to cb; pt should have available refill at [facility].

## 2015-07-10 DIAGNOSIS — M6281 Muscle weakness (generalized): Secondary | ICD-10-CM | POA: Diagnosis not present

## 2015-07-21 DIAGNOSIS — Z01419 Encounter for gynecological examination (general) (routine) without abnormal findings: Secondary | ICD-10-CM | POA: Diagnosis not present

## 2015-07-21 LAB — HM PAP SMEAR

## 2015-07-22 ENCOUNTER — Encounter: Payer: Self-pay | Admitting: Internal Medicine

## 2015-07-22 ENCOUNTER — Ambulatory Visit (INDEPENDENT_AMBULATORY_CARE_PROVIDER_SITE_OTHER): Payer: BLUE CROSS/BLUE SHIELD | Admitting: Internal Medicine

## 2015-07-22 VITALS — BP 96/70 | HR 76 | Temp 97.8°F | Ht 62.25 in | Wt 174.0 lb

## 2015-07-22 DIAGNOSIS — Z0000 Encounter for general adult medical examination without abnormal findings: Secondary | ICD-10-CM

## 2015-07-22 DIAGNOSIS — Z1211 Encounter for screening for malignant neoplasm of colon: Secondary | ICD-10-CM

## 2015-07-22 DIAGNOSIS — I82403 Acute embolism and thrombosis of unspecified deep veins of lower extremity, bilateral: Secondary | ICD-10-CM | POA: Diagnosis not present

## 2015-07-22 DIAGNOSIS — E039 Hypothyroidism, unspecified: Secondary | ICD-10-CM

## 2015-07-22 DIAGNOSIS — R5383 Other fatigue: Secondary | ICD-10-CM | POA: Diagnosis not present

## 2015-07-22 LAB — LIPID PANEL
CHOL/HDL RATIO: 4
Cholesterol: 199 mg/dL (ref 0–200)
HDL: 48.2 mg/dL (ref >39.00)
LDL CALC: 134 mg/dL — AB (ref 0–99)
NONHDL: 150.87
Triglycerides: 83 mg/dL (ref 0.0–149.0)
VLDL: 16.6 mg/dL (ref 0.0–40.0)

## 2015-07-22 LAB — COMPREHENSIVE METABOLIC PANEL
ALBUMIN: 4.1 g/dL (ref 3.5–5.2)
ALT: 11 U/L (ref 0–35)
AST: 15 U/L (ref 0–37)
Alkaline Phosphatase: 95 U/L (ref 39–117)
BUN: 8 mg/dL (ref 6–23)
CHLORIDE: 104 meq/L (ref 96–112)
CO2: 27 mEq/L (ref 19–32)
Calcium: 9.4 mg/dL (ref 8.4–10.5)
Creatinine, Ser: 0.67 mg/dL (ref 0.40–1.20)
GFR: 97.71 mL/min (ref >60.00)
Glucose, Bld: 84 mg/dL (ref 70–99)
POTASSIUM: 3.9 meq/L (ref 3.5–5.1)
SODIUM: 137 meq/L (ref 135–145)
Total Bilirubin: 0.4 mg/dL (ref 0.2–1.2)
Total Protein: 7.2 g/dL (ref 6.0–8.3)

## 2015-07-22 LAB — CBC WITH DIFFERENTIAL/PLATELET
BASOS PCT: 0.7 % (ref 0.0–3.0)
Basophils Absolute: 0.1 10*3/uL (ref 0.0–0.1)
EOS PCT: 1.6 % (ref 0.0–5.0)
Eosinophils Absolute: 0.1 10*3/uL (ref 0.0–0.7)
HEMATOCRIT: 39.8 % (ref 36.0–46.0)
HEMOGLOBIN: 13.2 g/dL (ref 12.0–15.0)
LYMPHS PCT: 30.9 % (ref 12.0–46.0)
Lymphs Abs: 2.3 10*3/uL (ref 0.7–4.0)
MCHC: 33.1 g/dL (ref 30.0–36.0)
MCV: 84.4 fl (ref 78.0–100.0)
MONO ABS: 0.4 10*3/uL (ref 0.1–1.0)
Monocytes Relative: 4.7 % (ref 3.0–12.0)
Neutro Abs: 4.7 10*3/uL (ref 1.4–7.7)
Neutrophils Relative %: 62.1 % (ref 43.0–77.0)
Platelets: 373 10*3/uL (ref 150.0–400.0)
RBC: 4.72 Mil/uL (ref 3.87–5.11)
RDW: 14.5 % (ref 11.5–15.5)
WBC: 7.5 10*3/uL (ref 4.0–10.5)

## 2015-07-22 LAB — T4, FREE: Free T4: 0.89 ng/dL (ref 0.60–1.60)

## 2015-07-22 LAB — TSH: TSH: 0.91 u[IU]/mL (ref 0.35–4.50)

## 2015-07-22 MED ORDER — LEVOTHYROXINE SODIUM 112 MCG PO TABS: ORAL_TABLET | ORAL | Status: DC

## 2015-07-22 NOTE — Assessment & Plan Note (Signed)
Healthy but out of shape with prolonged immobility Needs colonoscopy (esp after finding out genetic dad had colon cancer) UTD otherwise

## 2015-07-22 NOTE — Assessment & Plan Note (Signed)
Discussed water exercise, etc Will check labs

## 2015-07-22 NOTE — Progress Notes (Signed)
Pre visit review using our clinic review tool, if applicable. No additional management support is needed unless otherwise documented below in the visit note. 

## 2015-07-22 NOTE — Assessment & Plan Note (Signed)
Resolved Given multiple genetic thrombophilic predispositions, will set up with hematologist before stopping eliquis (but should be able to stop, I think)

## 2015-07-22 NOTE — Assessment & Plan Note (Signed)
Due for labs

## 2015-07-22 NOTE — Progress Notes (Signed)
Subjective:    Patient ID: Taylor Frederick, female    DOB: 08-02-1960, 55 y.o.   MRN: [id number]  HPI Here for physical  Recovering from surgery Walking since February-- in brace Then crutches and now cane---- will be losing the cane hopefully Still lots of pain and limp IVC filter was removed about 3 weeks ago Had normal ultrasound of both legs prior to that  Still stays worn out and tired Can't exercise--just trouble walking Lots of pain still  Still sees gyn at [facility] (retiring now) Pap done [facility] for mammograms and breast exams--already schedule  Current Outpatient Prescriptions on File Prior to Visit  Medication Sig Dispense Refill  . apixaban (ELIQUIS) 5 MG TABS tablet Take 1 tablet (5 mg total) by mouth 2 (two) times daily. 180 tablet 1  . omeprazole (PRILOSEC) 20 MG capsule Take 1 capsule (20 mg total) by mouth daily as needed. 1 capsule 0  . SYNTHROID 112 MCG tablet TAKE ONE (1) TABLET EACH DAY 90 tablet 3   No current facility-administered medications on file prior to visit.    No Known Allergies  Past Medical History  Diagnosis Date  . Hypothyroidism   . DVT (deep venous thrombosis) ([place]) 2003    With BCP's  . Venous insufficiency   . Diffuse cystic mastopathy   . Asymptomatic varicose veins     Past Surgical History  Procedure Laterality Date  . Vein procedure      Dr. Jamal Collin  . Dilation and curettage of uterus  ~2003  . Tubal ligation  2005  . Vein closure Left 2008    Left Leg  . Orif ankle fracture Left 01/31/2015    Procedure: OPEN REDUCTION INTERNAL FIXATION (ORIF) DISTAL TIBIA  FRACTURE;  Surgeon: Corky Mull, MD;  [name];  Service: Orthopedics;  Laterality: Left;  . Peripheral vascular catheterization N/A 02/24/2015    Procedure: IVC Filter Insertion;  Surgeon: Algernon Huxley, MD;  Location: [place] [facility];  Service: Cardiovascular;  [name];  . Peripheral vascular catheterization N/A 06/20/2015   Procedure: IVC Filter Removal;  Surgeon: Algernon Huxley, MD;  Location: [place] [facility];  Service: Cardiovascular;  [name];    Family History  Problem Relation Age of Onset  . Heart disease Mother     CHF  . Diabetes Mother   . Hypothyroidism Mother   . Cancer Maternal Aunt     Breast and Ovarian  (Believes BRCA positive)  . Cancer Other     Breast and ovarian CA (Believes BRCA positive)  . Cancer Maternal Aunt     Melanoma    Social History   Social History  . Marital Status: Married    Spouse Name: N/A  . Number of Children: 1  . Years of Education: N/A   Occupational History  . Merchant [employer] officer  .           Social History Main Topics  . Smoking status: Never Smoker   . Smokeless tobacco: Never Used  . Alcohol Use: No  . Drug Use: No  . Sexual Activity: Not on file   Other Topics Concern  . Not on file   Social History Narrative   Review of Systems  Constitutional: Positive for fatigue. Negative for unexpected weight change.       Wears seat belt  HENT: Negative for dental problem, hearing loss and tinnitus.  Keeps up with dentist  Eyes: Negative for visual disturbance.       No diplopia or unilateral vision  Respiratory: Negative for cough, chest tightness and shortness of breath.   Cardiovascular: Negative for chest pain and palpitations.  Gastrointestinal: Negative for nausea, vomiting, abdominal pain, constipation and blood in stool.  Endocrine: Negative for polydipsia and polyuria.  Genitourinary: Positive for dyspareunia. Negative for dysuria and hematuria.       Mild menopausal symptoms  Musculoskeletal: Positive for arthralgias. Negative for back pain.       Ongoing ankle pain and swelling  Skin: Negative for rash.       No suspicious lesions  Allergic/Immunologic: Negative for environmental allergies and immunocompromised state.  Neurological: Positive for headaches. Negative for dizziness, syncope and  light-headedness.  Hematological: Negative for adenopathy. Does not bruise/bleed easily.  Psychiatric/Behavioral: Negative for sleep disturbance and dysphoric mood. The patient is not nervous/anxious.        Objective:   Physical Exam  Constitutional: She appears well-developed and well-nourished. No distress.  HENT:  Head: Normocephalic and atraumatic.  Right Ear: External ear normal.  Left Ear: External ear normal.  Mouth/Throat: Oropharynx is clear and moist. No oropharyngeal exudate.  Eyes: Conjunctivae are normal. Pupils are equal, round, and reactive to light.  Neck: Normal range of motion. Neck supple. No thyromegaly present.  Cardiovascular: Normal rate, regular rhythm, normal heart sounds and intact distal pulses.  Exam reveals no gallop.   No murmur heard. Pulmonary/Chest: Effort normal and breath sounds normal. No respiratory distress. She has no wheezes. She has no rales.  Abdominal: Soft. There is no tenderness.  Musculoskeletal:  Left calf edema  Lymphadenopathy:    She has no cervical adenopathy.  Skin: No rash noted. No erythema.  Psychiatric: She has a normal mood and affect. Her behavior is normal.          Assessment & Plan:

## 2015-07-25 ENCOUNTER — Emergency Department: Payer: BLUE CROSS/BLUE SHIELD

## 2015-07-25 ENCOUNTER — Encounter: Payer: Self-pay | Admitting: Emergency Medicine

## 2015-07-25 DIAGNOSIS — I82532 Chronic embolism and thrombosis of left popliteal vein: Secondary | ICD-10-CM | POA: Diagnosis not present

## 2015-07-25 DIAGNOSIS — I82432 Acute embolism and thrombosis of left popliteal vein: Secondary | ICD-10-CM | POA: Diagnosis not present

## 2015-07-25 DIAGNOSIS — I8002 Phlebitis and thrombophlebitis of superficial vessels of left lower extremity: Secondary | ICD-10-CM | POA: Insufficient documentation

## 2015-07-25 DIAGNOSIS — Z8679 Personal history of other diseases of the circulatory system: Secondary | ICD-10-CM | POA: Diagnosis not present

## 2015-07-25 DIAGNOSIS — E039 Hypothyroidism, unspecified: Secondary | ICD-10-CM | POA: Insufficient documentation

## 2015-07-25 DIAGNOSIS — M79605 Pain in left leg: Secondary | ICD-10-CM | POA: Diagnosis present

## 2015-07-25 NOTE — ED Notes (Addendum)
Pt presents to ED c/o left leg pain since yesterday Pt had left leg surgery in November then right DVT Dec 20. Pt currently takes blood thinners, but "my leg is really sore to the touch and painful, it just feels funny". Presents with blt lower extremity swelling.

## 2015-07-26 ENCOUNTER — Telehealth: Payer: Self-pay

## 2015-07-26 ENCOUNTER — Emergency Department
Admission: EM | Admit: 2015-07-26 | Discharge: 2015-07-26 | Disposition: A | Discharge status: Home / Self Care | Payer: BLUE CROSS/BLUE SHIELD | Attending: Emergency Medicine | Admitting: Emergency Medicine

## 2015-07-26 DIAGNOSIS — I809 Phlebitis and thrombophlebitis of unspecified site: Secondary | ICD-10-CM

## 2015-07-26 DIAGNOSIS — I82532 Chronic embolism and thrombosis of left popliteal vein: Secondary | ICD-10-CM

## 2015-07-26 LAB — NICOTINE/COTININE METABOLITES: Cotinine: <10 ng/mL

## 2015-07-26 MED ORDER — ENOXAPARIN SODIUM 80 MG/0.8ML ~~LOC~~ SOLN
1.0000 mg/kg | Freq: Once | SUBCUTANEOUS | Status: AC
  Administered 2015-07-26: 75 mg via SUBCUTANEOUS

## 2015-07-26 MED ORDER — IBUPROFEN 600 MG PO TABS
600.0000 mg | ORAL_TABLET | Freq: Once | ORAL | Status: DC
  Filled 2015-07-26: qty 1

## 2015-07-26 MED ORDER — ENOXAPARIN SODIUM 100 MG/ML ~~LOC~~ SOLN
SUBCUTANEOUS | Status: AC
  Administered 2015-07-26: 75 mg via SUBCUTANEOUS
  Filled 2015-07-26: qty 1

## 2015-07-26 MED ORDER — ENOXAPARIN SODIUM 80 MG/0.8ML ~~LOC~~ SOLN: 1.0000 mg/kg | Freq: Two times a day (BID) | SUBCUTANEOUS | Status: DC

## 2015-07-26 MED ORDER — IBUPROFEN 800 MG PO TABS: 800.0000 mg | ORAL_TABLET | Freq: Three times a day (TID) | ORAL | Status: DC | PRN

## 2015-07-26 NOTE — ED Notes (Signed)
Patient told during d/c to NOT take her Eliquis while she was taking the Lovenox injections and to resume the Eliquis after the Lovenox injections were complete.

## 2015-07-26 NOTE — Telephone Encounter (Signed)
Left message for pt to call back  °

## 2015-07-26 NOTE — Telephone Encounter (Signed)
Left message for patient to call back to see how she is doing after her ER visit for DVT. I did not see the previous phone call from the patient in regards to her colonoscopy. I will follow-up with her through that message.

## 2015-07-26 NOTE — ED Notes (Signed)
Patient broke left ankle late last year. Began to have weakness in ankle on Friday. Began to wear compression stocking and brace. States that lower leg began to hurt on Sunday. States she does not think it is due to brace because she wore it without complications in the past.

## 2015-07-26 NOTE — ED Provider Notes (Signed)
[facility] Emergency Department Provider Note   ____________________________________________  Time seen: Approximately 0053 AM  I have reviewed the triage vital signs and the nursing notes.   HISTORY  Chief Complaint No chief complaint on file.    HPI Taylor Frederick is a 55 y.o. female who comes into the hospital with some pain in her leg since yesterday. The patient reports that she was concerned it could be a clot. The patient has had a blood clot in her left leg before back in 2003. She reports that she had an extensive DVT in her right lower extremity as well as a pulmonary embolism both lungs back in December. The patient has been on eliquis since then and also had an IVC filter placed. The patient broke her leg in November and had surgery prior to the development of her DVT.The patient reports that although she had a clot in her left lower extremity and 2003 8 has resolved. The patient had an IVC filter removed on April 16 and reports that an ultrasound done previously did not show clot. The patient reports though that it was done in Dr. Duane Lope office. Currently she is having 4/10 pain in her medial anterior leg. She reports is worse when she is walking on it. The patient denies any recent injury. The patient has not taken anything for pain. She reports that she initially had some ankle pain on Friday and wore her compression stockings. She then started wearing her ankle brace but she does not think the brace is what caused her pain. The patient is here for evaluation.   Past Medical History  Diagnosis Date  . Hypothyroidism   . DVT (deep venous thrombosis) ([place]) 2003    With BCP's  . Venous insufficiency   . Diffuse cystic mastopathy   . Asymptomatic varicose veins     Patient Active Problem List   Diagnosis Date Noted  . DVT of leg (deep venous thrombosis) ([place]) 03/01/2015  . Chest pain 02/23/2015  . PE (pulmonary embolism) 02/23/2015  .  Closed left tibial fracture 01/30/2015  . Fibrocystic disease of both breasts 09/21/2014  . Routine general medical examination at a health care facility 07/10/2013  . Fatigue 06/23/2012  . Hypothyroidism 10/19/2009  . VENOUS INSUFFICIENCY 10/19/2009    Past Surgical History  Procedure Laterality Date  . Vein procedure      Dr. Jamal Collin  . Dilation and curettage of uterus  ~2003  . Tubal ligation  2005  . Vein closure Left 2008    Left Leg  . Orif ankle fracture Left 01/31/2015    Procedure: OPEN REDUCTION INTERNAL FIXATION (ORIF) DISTAL TIBIA  FRACTURE;  Surgeon: Corky Mull, MD;  [name];  Service: Orthopedics;  Laterality: Left;  . Peripheral vascular catheterization N/A 02/24/2015    Procedure: IVC Filter Insertion;  Surgeon: Algernon Huxley, MD;  Location: [place] [facility];  Service: Cardiovascular;  [name];  . Peripheral vascular catheterization N/A 06/20/2015    Procedure: IVC Filter Removal;  Surgeon: Algernon Huxley, MD;  Location: [place] [facility];  Service: Cardiovascular;  [name];    Current Outpatient Rx  Name  Route  Sig  Dispense  Refill  . apixaban (ELIQUIS) 5 MG TABS tablet   Oral   Take 1 tablet (5 mg total) by mouth 2 (two) times daily.   180 tablet   1   . levothyroxine (SYNTHROID) 112 MCG tablet  TAKE ONE (1) TABLET EACH DAY   90 tablet   3   . omeprazole (PRILOSEC) 20 MG capsule   Oral   Take 1 capsule (20 mg total) by mouth daily as needed.   1 capsule   0   . enoxaparin (LOVENOX) 80 MG/0.8ML injection   Subcutaneous   Inject 0.75 mLs (75 mg total) into the skin 2 (two) times daily.   14 Syringe   0   . ibuprofen (ADVIL,MOTRIN) 800 MG tablet   Oral   Take 1 tablet (800 mg total) by mouth every 8 (eight) hours as needed.   15 tablet   0     Allergies Review of patient's allergies indicates no known allergies.  Family History  Problem Relation Age of Onset  . Heart disease Mother     CHF  . Diabetes  Mother   . Hypothyroidism Mother   . Cancer Maternal Aunt     Breast and Ovarian  (Believes BRCA positive)  . Cancer Other     Breast and ovarian CA (Believes BRCA positive)  . Cancer Maternal Aunt     Melanoma  . Cancer Father     Social History Social History  Substance Use Topics  . Smoking status: Never Smoker   . Smokeless tobacco: Never Used  . Alcohol Use: No    Review of Systems Constitutional: No fever/chills Eyes: No visual changes. ENT: No sore throat. Cardiovascular: Denies chest pain. Respiratory: Denies shortness of breath. Gastrointestinal: No abdominal pain.  No nausea, no vomiting.  No diarrhea.  No constipation. Genitourinary: Negative for dysuria. Musculoskeletal: Left leg pain Skin: Negative for rash. Neurological: Negative for headaches, focal weakness or numbness.  10-point ROS otherwise negative.  ____________________________________________   PHYSICAL EXAM:  VITAL SIGNS: ED Triage Vitals  Enc Vitals Group     BP 07/25/15 2046 140/74 mmHg     Pulse Rate 07/25/15 2046 81     Resp 07/25/15 2046 20     Temp 07/25/15 2046 98.4 F (36.9 C)     Temp Source 07/25/15 2046 Oral     SpO2 07/25/15 2046 100 %     Weight 07/25/15 2046 165 lb (74.844 kg)     Height 07/25/15 2046 5' 3" (1.6 m)     Head Cir --      Peak Flow --      Pain Score 07/25/15 2047 5     Pain Loc --      Pain Edu? --      Excl. in [place]? --     Constitutional: Alert and oriented. Well appearing and in no acute distress. Eyes: Conjunctivae are normal. PERRL. EOMI. Head: Atraumatic. Nose: No congestion/rhinnorhea. Mouth/Throat: Mucous membranes are moist.  Oropharynx non-erythematous. Cardiovascular: Normal rate, regular rhythm. Grossly normal heart sounds.  Good peripheral circulation. Respiratory: Normal respiratory effort.  No retractions. Lungs CTAB. Gastrointestinal: Soft and nontender. No distention. Positive bowel sounds Musculoskeletal: Left calf tenderness to  palpation, left medial leg tenderness to palpation.   Neurologic:  Normal speech and language.  Skin:  Skin is warm, dry and intact.  Psychiatric: Mood and affect are normal.   ____________________________________________   LABS (all labs ordered are listed, but only abnormal results are displayed)  Labs Reviewed - No data to display ____________________________________________  EKG  None ____________________________________________  RADIOLOGY  Left lower extremity Doppler: Nonocclusive thrombus within the popliteal vein, this likely represents a combination of acute and chronic thrombus, thrombosed superficial over the medial aspect of  the left lower leg. ____________________________________________   PROCEDURES  Procedure(s) performed: None  Critical Care performed: No  ____________________________________________   INITIAL IMPRESSION / ASSESSMENT AND PLAN / ED COURSE  Pertinent labs & imaging results that were available during my care of the patient were reviewed by me and considered in my medical decision making (see chart for details).  This is a 55 year old female who is a history of a DVT and PE who comes in with a nonocclusive DVT on her left lower extremity. The patient also has a superficial thrombus as well. We were unable to determine if the patient had a chronic nonocclusive clot in her left lower extremity prior to this pain beginning. I contacted Dr. Lucky Cowboy who is the patient's vascular surgeon. When I explained the results he reports that the nonocclusive thrombus is likely chronic. He reports that her pain is probably coming from the superficial thrombus and he recommended anticoagulating the patient. As the patient is already on Eliquis I will place her on Lovenox instead. I will have the patient follow-up with hematology as well as follow back up with Dr. Lucky Cowboy. I did offer the patient some ibuprofen but she did not take it. The patient will be discharged home to  follow up with hematology. Dr. Lucky Cowboy recommends following up in 1 week for repeat ultrasound. ____________________________________________   FINAL CLINICAL IMPRESSION(S) / ED DIAGNOSES  Final diagnoses:  Superficial thrombophlebitis  Chronic deep vein thrombosis (DVT) of popliteal vein of left lower extremity (HCC)      NEW MEDICATIONS STARTED DURING THIS VISIT:  Discharge Medication List as of 07/26/2015  3:11 AM    START taking these medications   Details  enoxaparin (LOVENOX) 80 MG/0.8ML injection Inject 0.75 mLs (75 mg total) into the skin 2 (two) times daily., Starting 07/26/2015, Until Wed 07/25/16, Print    ibuprofen (ADVIL,MOTRIN) 800 MG tablet Take 1 tablet (800 mg total) by mouth every 8 (eight) hours as needed., Starting 07/26/2015, Until Discontinued, Print         Note:  This document was prepared using Dragon voice recognition software and may include unintentional dictation errors.    Loney Hering, MD 07/26/15 [phone]

## 2015-07-26 NOTE — Telephone Encounter (Signed)
Yes--tell her I heard about the visit and am sorry she is having ongoing problems. She should definitely postpone the colonoscopy visit till she sees the hematologist

## 2015-07-26 NOTE — Discharge Instructions (Signed)
Phlebitis Phlebitis is soreness and swelling (inflammation) of a vein. This can occur in your arms, legs, or torso (trunk), as well as deeper inside your body. Phlebitis is usually not serious when it occurs close to the surface of the body. However, it can cause serious problems when it occurs in a vein deeper inside the body. CAUSES  Phlebitis can be triggered by various things, including:   Reduced blood flow through your veins. This can happen with:  Bed rest over a long period.  Long-distance travel.  Injury.  Surgery.  Being overweight (obese) or pregnant.  Having an IV tube put in the vein and getting certain medicines through the vein.  Cancer and cancer treatment.  Use of illegal drugs taken through the vein.  Inflammatory diseases.  Inherited (genetic) diseases that increase the risk of blood clots.  Hormone therapy, such as birth control pills. SIGNS AND SYMPTOMS   Red, tender, swollen, and painful area on your skin. Usually, the area will be long and narrow.  Firmness along the center of the affected area. This can indicate that a blood clot has formed.  Low-grade fever. DIAGNOSIS  A health care provider can usually diagnose phlebitis by examining the affected area and asking about your symptoms. To check for infection or blood clots, your health care provider may order blood tests or an ultrasound exam of the area. Blood tests and your family history may also indicate if you have an underlying genetic disease that causes blood clots. Occasionally, a piece of tissue is taken from the body (biopsy sample) if an unusual cause of phlebitis is suspected. TREATMENT  Treatment will vary depending on the severity of the condition and the area of the body affected. Treatment may include:  Use of a warm compress or heating pad.  Use of compression stockings or bandages.  Anti-inflammatory medicines.  Removal of any IV tube that may be causing the problem.  Medicines  that kill germs (antibiotics) if an infection is present.  Blood-thinning medicines if a blood clot is suspected or present.  In rare cases, surgery may be needed to remove damaged sections of vein. HOME CARE INSTRUCTIONS   Only take over-the-counter or prescription medicines as directed by your health care provider. Take all medicines exactly as prescribed.  Raise (elevate) the affected area above the level of your heart as directed by your health care provider.  Apply a warm compress or heating pad to the affected area as directed by your health care provider. Do not sleep with the heating pad.  Use compression stockings or bandages as directed. These will speed healing and prevent the condition from coming back.  If you are on blood thinners:  Get follow-up blood tests as directed by your health care provider.  Check with your health care provider before using any new medicines.  Carry a medical alert card or wear your medical alert jewelry to show that you are on blood thinners.  For phlebitis in the legs:  Avoid prolonged standing or bed rest.  Keep your legs moving. Raise your legs when sitting or lying.  Do not smoke.  Women, particularly those over the age of 59, should consider the risks and benefits of taking the contraceptive pill. This kind of hormone treatment can increase your risk for blood clots.  Follow up with your health care provider as directed. SEEK MEDICAL CARE IF:   You have unusual bruising or any bleeding problems.  Your swelling or pain in the affected area  is not improving.  You are on anti-inflammatory medicine, and you develop belly (abdominal) pain. SEEK IMMEDIATE MEDICAL CARE IF:   You have a sudden onset of chest pain or difficulty breathing.  You have a fever or persistent symptoms for more than 2-3 days.  You have a fever and your symptoms suddenly get worse. MAKE SURE YOU:  Understand these instructions.  Will watch your  condition.  Will get help right away if you are not doing well or get worse.   This information is not intended to replace advice given to you by your health care provider. Make sure you discuss any questions you have with your health care provider.   Document Released: 02/13/2001 Document Revised: 12/10/2012 Document Reviewed: 10/27/2012 Elsevier Interactive Patient Education 2016 [employer]  Deep Vein Thrombosis A deep vein thrombosis (DVT) is a blood clot (thrombus) that usually occurs in a deep, larger vein of the lower leg or the pelvis, or in an upper extremity such as the arm. These are dangerous and can lead to serious and even life-threatening complications if the clot travels to the lungs. A DVT can damage the valves in your leg veins so that instead of flowing upward, the blood pools in the lower leg. This is called post-thrombotic syndrome, and it can result in pain, swelling, discoloration, and sores on the leg. CAUSES A DVT is caused by the formation of a blood clot in your leg, pelvis, or arm. Usually, several things contribute to the formation of blood clots. A clot may develop when:  Your blood flow slows down.  Your vein becomes damaged in some way.  You have a condition that makes your blood clot more easily. RISK FACTORS A DVT is more likely to develop in:  People who are older, especially over 49 years of age.  People who are overweight (obese).  People who sit or lie still for a long time, such as during long-distance travel (over 4 hours), bed rest, hospitalization, or during recovery from certain medical conditions like a stroke.  People who do not engage in much physical activity (sedentary lifestyle).  People who have chronic breathing disorders.  People who have a personal or family history of blood clots or blood clotting disease.  People who have peripheral vascular disease (PVD), diabetes, or some types of cancer.  People who have heart disease,  especially if the person had a recent heart attack or has congestive heart failure.  People who have neurological diseases that affect the legs (leg paresis).  People who have had a traumatic injury, such as breaking a hip or leg.  People who have recently had major or lengthy surgery, especially on the hip, knee, or abdomen.  People who have had a central line placed inside a large vein.  People who take medicines that contain the hormone estrogen. These include birth control pills and hormone replacement therapy.  Pregnancy or during childbirth or the postpartum period.  Long plane flights (over 8 hours). SIGNS AND SYMPTOMS Symptoms of a DVT can include:   Swelling of your leg or arm, especially if one side is much worse.  Warmth and redness of your leg or arm, especially if one side is much worse.  Pain in your arm or leg. If the clot is in your leg, symptoms may be more noticeable or worse when you stand or walk.  A feeling of pins and needles, if the clot is in the arm. The symptoms of a DVT that has traveled  to the lungs (pulmonary embolism, PE) usually start suddenly and include:  Shortness of breath while active or at rest.  Coughing or coughing up blood or blood-tinged mucus.  Chest pain that is often worse with deep breaths.  Rapid or irregular heartbeat.  Feeling light-headed or dizzy.  Fainting.  Feeling anxious.  Sweating. There may also be pain and swelling in a leg if that is where the blood clot started. These symptoms may represent a serious problem that is an emergency. Do not wait to see if the symptoms will go away. Get medical help right away. Call your local emergency services (911 in the [place] Do not drive yourself to the hospital. DIAGNOSIS Your health care provider will take a medical history and perform a physical exam. You may also have other tests, including:  Blood tests to assess the clotting properties of your blood.  Imaging tests,  such as CT, ultrasound, MRI, X-ray, and other tests to see if you have clots anywhere in your body. TREATMENT After a DVT is identified, it can be treated. The type of treatment that you receive depends on many factors, such as the cause of your DVT, your risk for bleeding or developing more clots, and other medical conditions that you have. Sometimes, a combination of treatments is necessary. Treatment options may be combined and include:  Monitoring the blood clot with ultrasound.  Taking medicines by mouth, such as newer blood thinners (anticoagulants), thrombolytics, or warfarin.  Taking anticoagulant medicine by injection or through an IV tube.  Wearing compression stockings or using different types ofdevices.  Surgery (rare) to remove the blood clot or to place a filter in your abdomen to stop the blood clot from traveling to your lungs. Treatments for a DVT are often divided into immediate treatment and long-term treatment (up to 3 months after DVT). You can work with your health care provider to choose the treatment program that is best for you. HOME CARE INSTRUCTIONS If you are taking a newer oral anticoagulant:  Take the medicine every single day at the same time each day.  Understand what foods and drugs interact with this medicine.  Understand that there are no regular blood tests required when using this medicine.  Understand the side effects of this medicine, including excessive bruising or bleeding. Ask your health care provider or pharmacist about other possible side effects. If you are taking warfarin:  Understand how to take warfarin and know which foods can affect how warfarin works in [employer] account [employer].  Understand that it is dangerous to take too much or too little warfarin. Too much warfarin increases the risk of bleeding. Too little warfarin continues to allow the risk for blood clots.  Follow your PT and INR blood testing schedule. The PT and INR results allow your  health care provider to adjust your dose of warfarin. It is very important that you have your PT and INR tested as often as told by your health care provider.  Avoid major changes in your diet, or tell your health care provider before you change your diet. Arrange a visit with a registered dietitian to answer your questions. Many foods, especially foods that are high in vitamin K, can interfere with warfarin and affect the PT and INR results. Eat a consistent amount of foods that are high in vitamin K, such as:  Spinach, kale, broccoli, cabbage, collard greens, turnip greens, Brussels sprouts, peas, cauliflower, seaweed, and parsley.  Beef liver and pork liver.  Green tea.  Soybean oil.  Tell your health care provider about any and all medicines, vitamins, and supplements that you take, including aspirin and other over-the-counter anti-inflammatory medicines. Be especially cautious with aspirin and anti-inflammatory medicines. Do not take those before you ask your health care provider if it is safe to do so. This is important because many medicines can interfere with warfarin and affect the PT and INR results.  Do not start or stop taking any over-the-counter or prescription medicine unless your health care provider or pharmacist tells you to do so. If you take warfarin, you will also need to do these things:  Hold pressure over cuts for longer than usual.  Tell your dentist and other health care providers that you are taking warfarin before you have any procedures in which bleeding may occur.  Avoid alcohol or drink very small amounts. Tell your health care provider if you change your alcohol intake.  Do not use tobacco products, including cigarettes, chewing tobacco, and e-cigarettes. If you need help quitting, ask your health care provider.  Avoid contact sports. General Instructions  Take over-the-counter and prescription medicines only as told by your health care provider.  Anticoagulant medicines can have side effects, including easy bruising and difficulty stopping bleeding. If you are prescribed an anticoagulant, you will also need to do these things:  Hold pressure over cuts for longer than usual.  Tell your dentist and other health care providers that you are taking anticoagulants before you have any procedures in which bleeding may occur.  Avoid contact sports.  Wear a medical alert bracelet or carry a medical alert card that says you have had a PE.  Ask your health care provider how soon you can go back to your normal activities. Stay active to prevent new blood clots from forming.  Make sure to exercise while traveling or when you have been sitting or standing for a long period of time. It is very important to exercise. Exercise your legs by walking or by tightening and relaxing your leg muscles often. Take frequent walks.  Wear compression stockings as told by your health care provider to help prevent more blood clots from forming.  Do not use tobacco products, including cigarettes, chewing tobacco, and e-cigarettes. If you need help quitting, ask your health care provider.  Keep all follow-up appointments with your health care provider. This is important. PREVENTION Take these actions to decrease your risk of developing another DVT:  Exercise regularly. For at least 30 minutes every day, engage in:  Activity that involves moving your arms and legs.  Activity that encourages good blood flow through your body by increasing your heart rate.  Exercise your arms and legs every hour during long-distance travel (over 4 hours). Drink plenty of water and avoid drinking alcohol while traveling.  Avoid sitting or lying in bed for long periods of time without moving your legs.  Maintain a weight that is appropriate for your height. Ask your health care provider what weight is healthy for you.  If you are a woman who is over 33 years of age, avoid  unnecessary use of medicines that contain estrogen. These include birth control pills.  Do not smoke, especially if you take estrogen medicines. If you need help quitting, ask your health care provider. If you are hospitalized, prevention measures may include:  Early walking after surgery, as soon as your health care provider says that it is safe.  Receiving anticoagulants to prevent blood clots.If you cannot take anticoagulants, other options  may be available, such as wearing compression stockings or using different types of devices. SEEK IMMEDIATE MEDICAL CARE IF:  You have new or increased pain, swelling, or redness in an arm or leg.  You have numbness or tingling in an arm or leg.  You have shortness of breath while active or at rest.  You have chest pain.  You have a rapid or irregular heartbeat.  You feel light-headed or dizzy.  You cough up blood.  You notice blood in your vomit, bowel movement, or urine. These symptoms may represent a serious problem that is an emergency. Do not wait to see if the symptoms will go away. Get medical help right away. Call your local emergency services (911 in the [place] Do not drive yourself to the hospital.   This information is not intended to replace advice given to you by your health care provider. Make sure you discuss any questions you have with your health care provider.   Document Released: 02/19/2005 Document Revised: 11/10/2014 Document Reviewed: 06/16/2014 Elsevier Interactive Patient Education [employer]2016 [employer] Inc[employer]

## 2015-07-26 NOTE — Telephone Encounter (Signed)
Pt wanted Dr Alphonsus SiasLetvak to know [facility] and told had blood clot in lower lt leg; pt is waiting to hear from hematologist about an appt and pt wants to know if OK to postpone colonoscopy referral until blood clot issue is resolved. Pt is presently on lovanox. Pt request cb.

## 2015-07-29 DIAGNOSIS — I824Y2 Acute embolism and thrombosis of unspecified deep veins of left proximal lower extremity: Secondary | ICD-10-CM | POA: Diagnosis not present

## 2015-07-29 DIAGNOSIS — I8002 Phlebitis and thrombophlebitis of superficial vessels of left lower extremity: Secondary | ICD-10-CM | POA: Diagnosis not present

## 2015-08-02 ENCOUNTER — Encounter: Payer: Self-pay | Admitting: Internal Medicine

## 2015-08-02 ENCOUNTER — Telehealth: Payer: Self-pay | Admitting: Internal Medicine

## 2015-08-02 ENCOUNTER — Inpatient Hospital Stay: Payer: BLUE CROSS/BLUE SHIELD | Attending: Internal Medicine | Admitting: Internal Medicine

## 2015-08-02 VITALS — BP 120/77 | HR 82 | Temp 97.9°F | Resp 18 | Wt 177.2 lb

## 2015-08-02 DIAGNOSIS — I8390 Asymptomatic varicose veins of unspecified lower extremity: Secondary | ICD-10-CM | POA: Insufficient documentation

## 2015-08-02 DIAGNOSIS — I82432 Acute embolism and thrombosis of left popliteal vein: Secondary | ICD-10-CM | POA: Diagnosis not present

## 2015-08-02 DIAGNOSIS — Z86711 Personal history of pulmonary embolism: Secondary | ICD-10-CM | POA: Diagnosis not present

## 2015-08-02 DIAGNOSIS — I872 Venous insufficiency (chronic) (peripheral): Secondary | ICD-10-CM | POA: Insufficient documentation

## 2015-08-02 DIAGNOSIS — N6019 Diffuse cystic mastopathy of unspecified breast: Secondary | ICD-10-CM | POA: Insufficient documentation

## 2015-08-02 DIAGNOSIS — Z8701 Personal history of pneumonia (recurrent): Secondary | ICD-10-CM | POA: Insufficient documentation

## 2015-08-02 DIAGNOSIS — Z803 Family history of malignant neoplasm of breast: Secondary | ICD-10-CM | POA: Insufficient documentation

## 2015-08-02 DIAGNOSIS — Z7901 Long term (current) use of anticoagulants: Secondary | ICD-10-CM | POA: Diagnosis not present

## 2015-08-02 DIAGNOSIS — Z86718 Personal history of other venous thrombosis and embolism: Secondary | ICD-10-CM | POA: Insufficient documentation

## 2015-08-02 DIAGNOSIS — E039 Hypothyroidism, unspecified: Secondary | ICD-10-CM | POA: Diagnosis not present

## 2015-08-02 DIAGNOSIS — Z79899 Other long term (current) drug therapy: Secondary | ICD-10-CM | POA: Insufficient documentation

## 2015-08-02 DIAGNOSIS — Z8041 Family history of malignant neoplasm of ovary: Secondary | ICD-10-CM | POA: Diagnosis not present

## 2015-08-02 DIAGNOSIS — I82401 Acute embolism and thrombosis of unspecified deep veins of right lower extremity: Secondary | ICD-10-CM

## 2015-08-02 DIAGNOSIS — Z809 Family history of malignant neoplasm, unspecified: Secondary | ICD-10-CM | POA: Insufficient documentation

## 2015-08-02 MED ORDER — ENOXAPARIN SODIUM 80 MG/0.8ML ~~LOC~~ SOLN: 80.0000 mg | Freq: Two times a day (BID) | SUBCUTANEOUS | Status: DC

## 2015-08-02 NOTE — Progress Notes (Signed)
[place] NOTE  Patient Care Team: Venia Carbon, MD as PCP - General Seeplaputhur Robinette Haines, 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; Superficial superficial vein- Left Medial LLE- started on Lovenox 5m BID  HISTORY OF PRESENTING ILLNESS:  Taylor Frederick 55y.o.  female above history of recurrent DVT; prior history of PE and [place] and [place].  More recently patient had a ankle fracture traumatic; NP weeks later she presented to the emergency room for bilateral pulmonary embolism lower lobes. Patient was started on Eliquis twice a day; and also had IVC filter placed. The IVC filter was explanted in April 2017.  Patient presented to the emergency room in May 2017- pain in her left medial leg. Venous Doppler showed nonocclusive chronic popliteal vein DVT and superficial vein thrombosis. Patient was started on Lovenox twice a day.  Patient complains of mild pain in her left lower extremity- however significantly improved since starting back on Lovenox approximately 10 days ago.   Patient denies any gum bleeding or nose bleeds. Denies any rectal bleeding or vaginal bleeding. No falls.  ROS: A complete 10 point review of system is done which is negative except mentioned above in history of present illness  MEDICAL HISTORY:  Past Medical History  Diagnosis Date  . Hypothyroidism   . DVT (deep [place] thrombosis) [place]    With BCP's  . Venous insufficiency   . Diffuse cystic mastopathy    . Asymptomatic varicose veins     SURGICAL HISTORY: Past Surgical History  Procedure Laterality Date  . Vein procedure      Dr. SJamal Collin . Dilation and curettage of uterus  ~2003  . Tubal ligation  2005  . Vein closure Left 2008    Left Leg  . Orif ankle fracture Left 01/31/2015    Procedure: OPEN REDUCTION INTERNAL FIXATION (ORIF) DISTAL TIBIA  FRACTURE;  [name];  [name];  Service: Orthopedics;  Laterality: Left;  . Peripheral vascular catheterization N/A 02/24/2015    Procedure: IVC Filter Insertion;  [name];  [facility];  Service: Cardiovascular;  [name];  . Peripheral vascular catheterization N/A 06/20/2015    Procedure: IVC Filter Removal;  [name];  [facility];  Service: Cardiovascular;  [name];    SOCIAL HISTORY: Social History   Social History  . Marital Status: Married    Spouse Name: N/A  . Number of Children: 1  . Years of Education: N/A   Occupational History  . 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           Social History Main Topics  . Smoking status: Never Smoker   . Smokeless tobacco: Never Used  . Alcohol Use: No  . Drug Use: No  . Sexual Activity: Not on file   Other Topics Concern  . Not on file   Social History Narrative  FAMILY HISTORY: Family History  Problem Relation Age of Onset  . Heart disease Mother     CHF  . Diabetes Mother   . Hypothyroidism Mother   . Cancer Maternal Aunt     Breast and Ovarian  (Believes BRCA positive)  . Cancer Other     Breast and ovarian CA (Believes BRCA positive)  . Cancer Maternal Aunt     Melanoma  . Cancer Father     ALLERGIES:  has No Known Allergies.  MEDICATIONS:  Current Outpatient Prescriptions  Medication Sig Dispense Refill  . enoxaparin (LOVENOX) 80 MG/0.8ML injection Inject 0.75 mLs (75 mg total) into the skin 2 (two) times daily. 14 Syringe 0  .  ibuprofen (ADVIL,MOTRIN) 800 MG tablet Take 1 tablet (800 mg total) by mouth every 8 (eight) hours as needed. 15 tablet 0  . levothyroxine (SYNTHROID) 112 MCG tablet TAKE ONE (1) TABLET EACH DAY 90 tablet 3  . omeprazole (PRILOSEC) 20 MG capsule Take 1 capsule (20 mg total) by mouth daily as needed. 1 capsule 0   No current facility-administered medications for this visit.      Marland Kitchen  PHYSICAL EXAMINATION:   Filed Vitals:   08/02/15 0943  BP: 120/77  Pulse: 82  Temp: 97.9 F (36.6 C)  Resp: 18   Filed Weights   08/02/15 0943  Weight: 177 lb 4 oz (80.4 kg)    GENERAL: Well-nourished well-developed; Alert, no distress and comfortable.   Alone; walks with a cane.  EYES: no pallor or icterus OROPHARYNX: no thrush or ulceration; good dentition  NECK: supple, no masses felt LYMPH:  no palpable lymphadenopathy in the cervical, axillary or inguinal regions LUNGS: clear to auscultation and  No wheeze or crackles HEART/CVS: regular rate & rhythm and no murmurs; bilateral lower extremity swelling- left more than right.  ABDOMEN: abdomen soft, non-tender and normal bowel sounds Musculoskeletal:no cyanosis of digits and no clubbing  PSYCH: alert & oriented x 3 with fluent speech NEURO: no focal motor/sensory deficits SKIN:  no rashes or significant lesions  LABORATORY DATA:  I have reviewed the data as listed Lab Results  Component Value Date   WBC 7.5 07/22/2015   HGB 13.2 07/22/2015   HCT 39.8 07/22/2015   MCV 84.4 07/22/2015   PLT 373.0 07/22/2015    Recent Labs  01/30/15 1734 02/01/15 0734 02/02/15 0834 02/23/15 1221 02/23/15 1355 07/22/15 1224  NA 137 137 137 133*  --  137  K 3.8 4.3 3.7 3.7  --  3.9  CL 103 105 102 98  --  104  CO2 _0 --  27  GLUCOSE 118* 137* 128* 102*  --  84  BUN _1 --  8  CREATININE 0.84 0.67 0.75 0.71 0.70 0.67  CALCIUM 9.1 8.6* 8.6* 9.4  --  9.4  GFRNONAA >60 >60 >60  --   --   --   GFRAA >60 >60 >60  --   --   --    PROT  --   --   --  7.6  --  7.2  ALBUMIN  --   --   --  3.6  --  4.1  AST  --   --   --  22  --  15  ALT  --   --   --  18  --  11  ALKPHOS  --   --   --  126*  --  95  BILITOT  --   --   --  0.5  --  0.4    RADIOGRAPHIC STUDIES: I have personally reviewed the radiological images as listed and agreed with the findings in the report. US Venous Img Lower Unilateral Left  07/25/2015  CLINICAL DATA:  Warm, painful, swollen left lower extremity 3 days. Patient is on Eliquis. EXAM: LEFT LOWER EXTREMITY VENOUS DOPPLER ULTRASOUND TECHNIQUE: Gray-scale sonography with graded compression, as well as color Doppler and duplex ultrasound were performed to evaluate the lower extremity deep venous systems from the level of the common femoral vein and including the common femoral, femoral, profunda femoral, popliteal and calf veins including the posterior tibial, peroneal and gastrocnemius veins when visible. The superficial great saphenous vein was also interrogated. Spectral Doppler was utilized to evaluate flow at rest and with distal augmentation maneuvers in the common femoral, femoral and popliteal veins. COMPARISON:  02/24/2015 FINDINGS: Contralateral Common Femoral Vein: Respiratory phasicity is normal and symmetric with the symptomatic side. No evidence of thrombus. Normal compressibility. Common Femoral Vein: No evidence of thrombus. Normal compressibility, respiratory phasicity and response to augmentation. Saphenofemoral Junction: No evidence of thrombus. Normal compressibility and flow on color Doppler imaging. Profunda Femoral Vein: No evidence of thrombus. Normal compressibility and flow on color Doppler imaging. Femoral Vein: No evidence of thrombus. Normal compressibility, respiratory phasicity and response to augmentation. Popliteal Vein: Evidence of partially compressible nonocclusive thrombus. The pain is expanded somewhat suggesting an acute component some of this thrombus is likely chronic. Calf  Veins: Somewhat difficult to evaluate due to subcutaneous edema. No evidence of thrombus. Normal compressibility and flow on color Doppler imaging. Superficial Great Saphenous Vein: No evidence of thrombus. Normal compressibility and flow on color Doppler imaging. Venous Reflux:  None. Other Findings: Mild thrombus within superficial vein over the medial left lower leg. IMPRESSION: Nonocclusive thrombus within popliteal vein. This likely represents a combination of acute and chronic thrombus. Thrombosed superficial over the medial aspect of the left lower leg. These results were called by telephone at the time of interpretation on 07/25/2015 at 10:12 pm to Dr. Nance Pear , who verbally acknowledged these results. Electronically Signed   By: Marin Olp M.D.   On: 07/25/2015 22:13    ASSESSMENT & PLAN:   # Recurrent DVT/factor V Leiden heterozygosity- ? Protein S def [done at time of acute DVT] [acquired risk factors include-limited mobility because of left ankle fracture/surgery].   Most recent superficial venous thrombosis- left lower extremity. I discussed the multiple options- including Coumadin; indefinite Lovenox; transition to other anticoagulation- like Xarelto. However given the superficial venous thrombosis; given the absence of a new DVT-  I recommend continuation of Lovenox for 6 more weeks; and then transition back to Eliquis 5 mg BID thereafter. New prescription for Lovenox given.  #Left lower extremity postphlebitic syndrome- recommend Stockings when the patient is ambulatory.   # Patient follow-up with me in 6 weeks; CBC CMP. I spoke Dr.Dew re: the above plan. He agrees.   Thank you Dr. Silvio Pate for allowing me to participate in the care of your pleasant patient. Please do not hesitate to contact me with questions or concerns in the interim.  # 45 minutes face-to-face with the patient discussing the above plan of care; more than 50% of time spent on prognosis/ natural history;  counseling and coordination.    Cammie Sickle, MD 08/02/2015 9:48 AM

## 2015-08-02 NOTE — Telephone Encounter (Signed)
Spoke to pt

## 2015-08-02 NOTE — Progress Notes (Signed)
Patient here today for DVT in left leg. Referred by Dr. Alphonsus SiasLetvak.  Hx of blood clot in left leg behind knee.  Patient had been on birth control at that time.  She broke her leg and had to have surgery.  After 2 weeks she was taken off all blood thinners and within a month she was back in the hospital for clot in her leg and bilateral PE's.

## 2015-08-02 NOTE — Telephone Encounter (Signed)
It has been faxed already. Spoke to pt

## 2015-08-02 NOTE — Telephone Encounter (Signed)
Patient came in for a physical on 07/22/15.  Patient said she brought in a form from [facility] to be filled out.  Patient said [facility] came [facility].  Patient said form needs to be sent in by today for her to get the price reduction on her insurance for the year.  Please call patient.

## 2015-08-03 ENCOUNTER — Encounter: Payer: Self-pay | Admitting: *Deleted

## 2015-08-03 NOTE — Progress Notes (Signed)
Lovenox rx faxed to [facility].

## 2015-08-08 ENCOUNTER — Ambulatory Visit: Payer: BLUE CROSS/BLUE SHIELD | Admitting: Internal Medicine

## 2015-08-15 DIAGNOSIS — S82812D Torus fracture of upper end of left fibula, subsequent encounter for fracture with routine healing: Secondary | ICD-10-CM | POA: Diagnosis not present

## 2015-08-15 DIAGNOSIS — S82872S Displaced pilon fracture of left tibia, sequela: Secondary | ICD-10-CM | POA: Diagnosis not present

## 2015-08-22 ENCOUNTER — Encounter: Payer: Self-pay | Admitting: Internal Medicine

## 2015-08-22 ENCOUNTER — Telehealth: Payer: Self-pay | Admitting: *Deleted

## 2015-08-22 ENCOUNTER — Other Ambulatory Visit: Payer: Self-pay | Admitting: Internal Medicine

## 2015-08-22 DIAGNOSIS — I82401 Acute embolism and thrombosis of unspecified deep veins of right lower extremity: Secondary | ICD-10-CM

## 2015-08-22 MED ORDER — APIXABAN 5 MG PO TABS: 5.0000 mg | ORAL_TABLET | Freq: Two times a day (BID) | ORAL | Status: DC

## 2015-08-22 NOTE — Telephone Encounter (Signed)
Per Dr B, stop Lovenox and begin Eliquis 5 mg BID, send in Rx if she does not have one on hand.  I called and got VM and left instructions for patient and asked that she  Return my call to conform receipt and understands directions

## 2015-08-22 NOTE — Telephone Encounter (Signed)
Has knots on both sides from her Lovenox injections from at least a week ago. They are sore and it is becoming difficulty finding a place to give herself her injectiopns. Please advise

## 2015-08-23 NOTE — Telephone Encounter (Signed)
I spoke with patietn and we confirmed directions to stop the Lovenox and start on Eliquis, she had questions regarding what the difference was between the 2 drugs which I answered and she voiced understanding

## 2015-09-05 DIAGNOSIS — I82409 Acute embolism and thrombosis of unspecified deep veins of unspecified lower extremity: Secondary | ICD-10-CM | POA: Diagnosis not present

## 2015-09-05 DIAGNOSIS — M7989 Other specified soft tissue disorders: Secondary | ICD-10-CM | POA: Diagnosis not present

## 2015-09-05 DIAGNOSIS — M79609 Pain in unspecified limb: Secondary | ICD-10-CM | POA: Diagnosis not present

## 2015-09-05 DIAGNOSIS — I824Z2 Acute embolism and thrombosis of unspecified deep veins of left distal lower extremity: Secondary | ICD-10-CM | POA: Diagnosis not present

## 2015-09-13 ENCOUNTER — Ambulatory Visit: Payer: BLUE CROSS/BLUE SHIELD | Admitting: Internal Medicine

## 2015-09-13 ENCOUNTER — Other Ambulatory Visit: Payer: BLUE CROSS/BLUE SHIELD

## 2015-09-14 ENCOUNTER — Inpatient Hospital Stay: Payer: BLUE CROSS/BLUE SHIELD

## 2015-09-14 ENCOUNTER — Other Ambulatory Visit: Payer: Self-pay | Admitting: *Deleted

## 2015-09-14 ENCOUNTER — Telehealth: Payer: Self-pay | Admitting: *Deleted

## 2015-09-14 ENCOUNTER — Other Ambulatory Visit: Payer: Self-pay | Admitting: Oncology

## 2015-09-14 ENCOUNTER — Inpatient Hospital Stay: Payer: BLUE CROSS/BLUE SHIELD | Attending: Internal Medicine | Admitting: Oncology

## 2015-09-14 VITALS — BP 129/81 | HR 73 | Temp 97.8°F | Wt 174.1 lb

## 2015-09-14 DIAGNOSIS — Z8041 Family history of malignant neoplasm of ovary: Secondary | ICD-10-CM | POA: Insufficient documentation

## 2015-09-14 DIAGNOSIS — I82401 Acute embolism and thrombosis of unspecified deep veins of right lower extremity: Secondary | ICD-10-CM

## 2015-09-14 DIAGNOSIS — I8390 Asymptomatic varicose veins of unspecified lower extremity: Secondary | ICD-10-CM | POA: Diagnosis not present

## 2015-09-14 DIAGNOSIS — I2699 Other pulmonary embolism without acute cor pulmonale: Secondary | ICD-10-CM

## 2015-09-14 DIAGNOSIS — Z79899 Other long term (current) drug therapy: Secondary | ICD-10-CM | POA: Insufficient documentation

## 2015-09-14 DIAGNOSIS — Z7901 Long term (current) use of anticoagulants: Secondary | ICD-10-CM

## 2015-09-14 DIAGNOSIS — R609 Edema, unspecified: Secondary | ICD-10-CM | POA: Insufficient documentation

## 2015-09-14 DIAGNOSIS — Z8701 Personal history of pneumonia (recurrent): Secondary | ICD-10-CM | POA: Diagnosis not present

## 2015-09-14 DIAGNOSIS — Z86711 Personal history of pulmonary embolism: Secondary | ICD-10-CM

## 2015-09-14 DIAGNOSIS — E039 Hypothyroidism, unspecified: Secondary | ICD-10-CM | POA: Diagnosis not present

## 2015-09-14 DIAGNOSIS — N649 Disorder of breast, unspecified: Secondary | ICD-10-CM | POA: Insufficient documentation

## 2015-09-14 DIAGNOSIS — Z86718 Personal history of other venous thrombosis and embolism: Secondary | ICD-10-CM | POA: Diagnosis not present

## 2015-09-14 DIAGNOSIS — Z803 Family history of malignant neoplasm of breast: Secondary | ICD-10-CM

## 2015-09-14 DIAGNOSIS — I872 Venous insufficiency (chronic) (peripheral): Secondary | ICD-10-CM

## 2015-09-14 LAB — CBC WITH DIFFERENTIAL/PLATELET
Basophils Absolute: 0.1 10*3/uL (ref 0–0.1)
Basophils Relative: 1 %
EOS ABS: 0.1 10*3/uL (ref 0–0.7)
EOS PCT: 1 %
HCT: 40.9 % (ref 35.0–47.0)
Hemoglobin: 14.1 g/dL (ref 12.0–16.0)
LYMPHS ABS: 2.3 10*3/uL (ref 1.0–3.6)
LYMPHS PCT: 36 %
MCH: 29.1 pg (ref 26.0–34.0)
MCHC: 34.5 g/dL (ref 32.0–36.0)
MCV: 84.3 fL (ref 80.0–100.0)
MONO ABS: 0.3 10*3/uL (ref 0.2–0.9)
MONOS PCT: 5 %
Neutro Abs: 3.6 10*3/uL (ref 1.4–6.5)
Neutrophils Relative %: 57 %
PLATELETS: 387 10*3/uL (ref 150–440)
RBC: 4.85 MIL/uL (ref 3.80–5.20)
RDW: 13.4 % (ref 11.5–14.5)
WBC: 6.4 10*3/uL (ref 3.6–11.0)

## 2015-09-14 LAB — COMPREHENSIVE METABOLIC PANEL
ALT: 16 U/L (ref 14–54)
ANION GAP: 6 (ref 5–15)
AST: 17 U/L (ref 15–41)
Albumin: 4.2 g/dL (ref 3.5–5.0)
Alkaline Phosphatase: 102 U/L (ref 38–126)
BUN: 11 mg/dL (ref 6–20)
CALCIUM: 9.2 mg/dL (ref 8.9–10.3)
CHLORIDE: 101 mmol/L (ref 101–111)
CO2: 28 mmol/L (ref 22–32)
CREATININE: 0.73 mg/dL (ref 0.44–1.00)
Glucose, Bld: 99 mg/dL (ref 65–99)
Potassium: 3.9 mmol/L (ref 3.5–5.1)
SODIUM: 135 mmol/L (ref 135–145)
Total Bilirubin: 0.3 mg/dL (ref 0.3–1.2)
Total Protein: 8.1 g/dL (ref 6.5–8.1)

## 2015-09-14 MED ORDER — APIXABAN 5 MG PO TABS: 5.0000 mg | ORAL_TABLET | Freq: Two times a day (BID) | ORAL | Status: DC

## 2015-09-14 NOTE — Telephone Encounter (Signed)
90 days supply rx for eliquis sent to patient's pharmacy.

## 2015-09-14 NOTE — Progress Notes (Signed)
Taylor Frederick  Patient Care Team: Taylor Carbon, MD as PCP - General Seeplaputhur Taylor Haines, 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; Superficial superficial vein- Left Medial LLE- started on Lovenox 56m BID  HISTORY OF PRESENTING ILLNESS:  Taylor Frederick 55y.o.  female above history of recurrent DVT; prior history of PE returns to clinic for follow up visit. She feels well today and is asymptomatic. She had an ultrasound last week of both legs and it was normal. She takes Eliquis 5 mg BID since June 19th. She was switched from lovenox due to developing knots on her abdomen from the injections. She denies any pain or unusual bleeding.  She is scheduled for another ultrasound in 3 months with Taylor Frederick   ROS: A complete 10 point review of system is done which is negative except mentioned above in history of present illness  MEDICAL HISTORY:  Past Medical History  Diagnosis Date  . Hypothyroidism   . D[place]) [place]    With BCP's  . Venous insufficiency   . Diffuse cystic mastopathy   . Asymptomatic varicose veins   . Edema of both legs     SURGICAL HISTORY: Past Surgical History  Procedure Laterality Date  . Vein procedure      Taylor. SJamal Frederick . Dilation and curettage of uterus  ~2003  . Tubal ligation  2005  . Vein closure Left 2008    Left Leg  . Orif ankle fracture Left 01/31/2015    Procedure: OPEN REDUCTION INTERNAL FIXATION (ORIF) DISTAL TIBIA  FRACTURE;  Surgeon: Taylor Mull MD;  [name];   Service: Orthopedics;  Laterality: Left;  . Peripheral vascular catheterization N/A 02/24/2015    Procedure: IVC Filter Insertion;  Surgeon: Taylor Huxley MD;  [facility];  Service: Cardiovascular;  [name];  . Peripheral vascular catheterization N/A 06/20/2015    Procedure: IVC Filter Removal;  Surgeon: Taylor Huxley MD;  [facility];  Service: Cardiovascular;  [name];    SOCIAL HISTORY: Social History   Social History  . Marital Status: Married    Spouse Name: N/A  . Number of Children: 1  . Years of Education: N/A   Occupational History  . 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           Social History Main Topics  . Smoking status: Never Smoker   . Smokeless tobacco: Never Used  . Alcohol Use: No  . Drug Use: No  . Sexual Activity: Not on file   Other Topics Concern  . Not on file   Social History Narrative    FAMILY HISTORY: Family History  Problem Relation Age of Onset  . Heart disease Mother     CHF  . Diabetes Mother   . Hypothyroidism Mother   . Cancer Maternal Aunt     Breast and Ovarian  (Believes BRCA positive)  . Cancer Other     Breast and  ovarian CA (Believes BRCA positive)  . Cancer Maternal Aunt     Melanoma  . Cancer Father     ALLERGIES:  has No Known Allergies.  MEDICATIONS:  Current Outpatient Prescriptions  Medication Sig Dispense Refill  . apixaban (ELIQUIS) 5 MG TABS tablet Take 1 tablet (5 mg total) by mouth 2 (two) times daily. 60 tablet 0  . ibuprofen (ADVIL,MOTRIN) 800 MG tablet Take by mouth.    . levothyroxine (SYNTHROID) 112 MCG tablet TAKE ONE (1) TABLET EACH DAY 90 tablet 3  . omeprazole (PRILOSEC) 20 MG capsule Take 1 capsule (20 mg total) by mouth daily as needed. 1 capsule 0   No current facility-administered medications for this visit.      Marland Kitchen  PHYSICAL EXAMINATION:   Filed Vitals:   09/14/15 1128  BP: 129/81  Pulse: 73  Temp: 97.8 F (36.6 C)    Filed Weights   09/14/15 1128  Weight: 174 lb 2 oz (78.983 kg)    GENERAL: Well-nourished well-developed; Alert, no distress and comfortable. Alone EYES: no pallor or icterus OROPHARYNX: no thrush or ulceration; good dentition  LUNGS: clear to auscultation and  No wheeze or crackles HEART/CVS: regular rate & rhythm and no murmurs ABDOMEN: abdomen soft, non-tender and normal bowel sounds Musculoskeletal:no cyanosis of digits and no clubbing  PSYCH: alert & oriented x 3 with fluent speech NEURO: no focal motor/sensory deficits SKIN:  no rashes or significant lesions  LABORATORY DATA:  I have reviewed the data as listed Lab Results  Component Value Date   WBC 6.4 09/14/2015   HGB 14.1 09/14/2015   HCT 40.9 09/14/2015   MCV 84.3 09/14/2015   PLT 387 09/14/2015    Recent Labs  02/01/15 0734 02/02/15 0834 02/23/15 1221 02/23/15 1355 07/22/15 1224 09/14/15 1102  NA 137 137 133*  --  137 135  K 4.3 3.7 3.7  --  3.9 3.9  CL 105 102 98  --  104 101  CO2 26 29 28   --  27 28  GLUCOSE 137* 128* 102*  --  84 99  BUN 7 11 12   --  8 11  CREATININE 0.67 0.75 0.71 0.70 0.67 0.73  CALCIUM 8.6* 8.6* 9.4  --  9.4 9.2  GFRNONAA >60 >60  --   --   --  >60  GFRAA >60 >60  --   --   --  >60  PROT  --   --  7.6  --  7.2 8.1  ALBUMIN  --   --  3.6  --  4.1 4.2  AST  --   --  22  --  15 17  ALT  --   --  18  --  11 16  ALKPHOS  --   --  126*  --  95 102  BILITOT  --   --  0.5  --  0.4 0.3    RADIOGRAPHIC STUDIES: I have personally reviewed the radiological images as listed and agreed with the findings in the report. No results found.  ASSESSMENT & PLAN:   1. Recurrent DVT/factor V Leiden heterozygosity- ? Protein S def [done at time of acute DVT] [acquired risk factors include-limited mobility because of left ankle fracture/surgery].  Continue taking Eliquis 35m BID. Follow-up with Taylor Taylor Frederick in 3 months; CBC CMP.   Taylor. BRogue Bussingwas available for consultation and review of plan of  care for this patient.    TMayra Reel NP 09/14/2015 11:41 AM

## 2015-09-14 NOTE — Telephone Encounter (Signed)
-----   Message from Lane HackerLisa R Torain sent at 09/14/2015  4:26 PM EDT ----- Contact: [phone] Pt need refill Eliquis . [facility].

## 2015-09-15 ENCOUNTER — Encounter: Payer: Self-pay | Admitting: *Deleted

## 2015-09-22 ENCOUNTER — Ambulatory Visit: Payer: BLUE CROSS/BLUE SHIELD | Admitting: General Surgery

## 2015-09-22 DIAGNOSIS — Z1231 Encounter for screening mammogram for malignant neoplasm of breast: Secondary | ICD-10-CM | POA: Diagnosis not present

## 2015-09-26 ENCOUNTER — Encounter: Payer: Self-pay | Admitting: General Surgery

## 2015-10-17 ENCOUNTER — Encounter: Payer: Self-pay | Admitting: General Surgery

## 2015-10-17 ENCOUNTER — Ambulatory Visit (INDEPENDENT_AMBULATORY_CARE_PROVIDER_SITE_OTHER): Payer: BLUE CROSS/BLUE SHIELD | Admitting: General Surgery

## 2015-10-17 VITALS — BP 128/72 | HR 64 | Ht 63.0 in | Wt 176.0 lb

## 2015-10-17 DIAGNOSIS — N6011 Diffuse cystic mastopathy of right breast: Secondary | ICD-10-CM

## 2015-10-17 DIAGNOSIS — Z803 Family history of malignant neoplasm of breast: Secondary | ICD-10-CM | POA: Diagnosis not present

## 2015-10-17 DIAGNOSIS — N6012 Diffuse cystic mastopathy of left breast: Secondary | ICD-10-CM

## 2015-10-17 NOTE — Progress Notes (Signed)
Patient ID: Taylor Frederick, female   DOB: [birth year], 55 y.o.   MRN: [id number]  Chief Complaint  Patient presents with  . Follow-up    mammogram    HPI Taylor Frederick is a 55 y.o. female who presents for a breast evaluation. The most recent mammogram was done on 09/15/15. Patient does perform regular self breast checks and gets regular mammograms done. Patient states no new breast changes.  Today, the patient complains of left shoulder pain. She has trouble touching the back of her head and can not scratch her back with her left hand. She has not yet consulted an orthopedist about the problem.  November, 2016: Patient fell off a ladder and broke her left ankle and tibia. She was treated with ORIF and placed eliquis for 2 weeks. Approximately one month later she experienced chest pain that woke her from sleep for two nights. A CT scan found extensive DVTs in her right leg and PE in both lungs, she had an IVC filter inserted and removed in 06/2015.   I have reviewed the history of present illness with the patient.  HPI  Past Medical History:  Diagnosis Date  . Asymptomatic varicose veins   . Diffuse cystic mastopathy   . DVT (deep venous thrombosis) ([place]) 2003   With BCP's  . Edema of both legs   . Hypothyroidism   . Venous insufficiency     Past Surgical History:  Procedure Laterality Date  . DILATION AND CURETTAGE OF UTERUS  ~2003  . ORIF ANKLE FRACTURE Left 01/31/2015   Procedure: OPEN REDUCTION INTERNAL FIXATION (ORIF) DISTAL TIBIA  FRACTURE;  Surgeon: Corky Mull, MD;  [name];  Service: Orthopedics;  Laterality: Left;  . PERIPHERAL VASCULAR CATHETERIZATION N/A 02/24/2015   Procedure: IVC Filter Insertion;  Surgeon: Algernon Huxley, MD;  Location: [place] [facility];  Service: Cardiovascular;  [name];  . PERIPHERAL VASCULAR CATHETERIZATION N/A 06/20/2015   Procedure: IVC Filter Removal;  Surgeon: Algernon Huxley, MD;  Location: [place] [facility];   Service: Cardiovascular;  [name];  . TUBAL LIGATION  2005  . vein closure Left 2008   Left Leg  . Vein Procedure     Dr. Jamal Collin    Family History  Problem Relation Age of Onset  . Heart disease Mother     CHF  . Diabetes Mother   . Hypothyroidism Mother   . Cancer Maternal Aunt     Breast and Ovarian  (Believes BRCA positive)  . Cancer Other     Breast and ovarian CA (Believes BRCA positive)  . Cancer Maternal Aunt     Melanoma  . Cancer Father     Social History Social History  Substance Use Topics  . Smoking status: Never Smoker  . Smokeless tobacco: Never Used  . Alcohol use No    No Known Allergies  Current Outpatient Prescriptions  Medication Sig Dispense Refill  . apixaban (ELIQUIS) 5 MG TABS tablet Take 1 tablet (5 mg total) by mouth 2 (two) times daily. 180 tablet 4  . levothyroxine (SYNTHROID) 112 MCG tablet TAKE ONE (1) TABLET EACH DAY 90 tablet 3  . omeprazole (PRILOSEC) 20 MG capsule Take 1 capsule (20 mg total) by mouth daily as needed. 1 capsule 0   No current facility-administered medications for this visit.     Review of Systems Review of Systems  Constitutional: Negative.   Respiratory: Negative.   Cardiovascular: Negative.     Blood pressure  128/72, pulse 64, height _0  (1.6 m), weight 176 lb (79.8 kg).  Physical Exam Physical Exam  Constitutional: She is oriented to person, place, and time. She appears well-developed and well-nourished.  Eyes: Conjunctivae are normal. No scleral icterus.  Neck: Neck supple.  Cardiovascular: Normal rate, regular rhythm and normal heart sounds.   Pulmonary/Chest: Effort normal and breath sounds normal. Right breast exhibits no inverted nipple, no mass, no nipple discharge, no skin change and no tenderness. Left breast exhibits no inverted nipple, no mass, no nipple discharge, no skin change and no tenderness.  Musculoskeletal:       Left shoulder: She exhibits tenderness.  Lymphadenopathy:     She has no cervical adenopathy.    She has no axillary adenopathy.  Neurological: She is alert and oriented to person, place, and time.  Skin: Skin is warm and dry.    Data Reviewed  Mammogram reviewed-stable  Assessment   Remote family history of breast cancer Stable-History of FCD.     Plan    Patient will be asked to return to the office in one year with a bilateral screening mammogram. Patient advised to see an orthopedic doctor for shoulder pain.     This information has been scribed by Verlene Mayer, CMA.    SANKAR,SEEPLAPUTHUR G 10/17/2015, 10:35 AM

## 2015-10-17 NOTE — Patient Instructions (Addendum)
Patient will be asked to return to the office in one year with a bilateral screening mammogram. Continue self breast exams. Call office for any new breast issues or concerns. Advised to see an orthopedic doctor for shoulder pain.

## 2015-10-22 ENCOUNTER — Encounter (INDEPENDENT_AMBULATORY_CARE_PROVIDER_SITE_OTHER): Payer: Self-pay

## 2015-10-22 DIAGNOSIS — I87091 Postthrombotic syndrome with other complications of right lower extremity: Secondary | ICD-10-CM | POA: Insufficient documentation

## 2015-10-26 ENCOUNTER — Other Ambulatory Visit: Payer: BLUE CROSS/BLUE SHIELD

## 2015-10-26 ENCOUNTER — Ambulatory Visit: Payer: BLUE CROSS/BLUE SHIELD | Admitting: Internal Medicine

## 2015-11-30 ENCOUNTER — Other Ambulatory Visit: Payer: Self-pay | Admitting: Internal Medicine

## 2015-12-01 NOTE — Telephone Encounter (Signed)
Spoke to pt to verify which pharmacy she is wanting the rx to go. It was sent to [facility] in May. She said [facility] will not give her the name brand. She wants to see if [facility] will give her the name brand. Rx sent to [facility].

## 2015-12-03 DIAGNOSIS — J069 Acute upper respiratory infection, unspecified: Secondary | ICD-10-CM | POA: Diagnosis not present

## 2015-12-03 DIAGNOSIS — B9789 Other viral agents as the cause of diseases classified elsewhere: Secondary | ICD-10-CM | POA: Diagnosis not present

## 2015-12-09 ENCOUNTER — Encounter (INDEPENDENT_AMBULATORY_CARE_PROVIDER_SITE_OTHER): Payer: BLUE CROSS/BLUE SHIELD

## 2015-12-09 ENCOUNTER — Other Ambulatory Visit: Payer: Self-pay

## 2015-12-09 ENCOUNTER — Ambulatory Visit (INDEPENDENT_AMBULATORY_CARE_PROVIDER_SITE_OTHER): Payer: BLUE CROSS/BLUE SHIELD | Admitting: Vascular Surgery

## 2015-12-09 DIAGNOSIS — I82401 Acute embolism and thrombosis of unspecified deep veins of right lower extremity: Secondary | ICD-10-CM

## 2015-12-15 ENCOUNTER — Inpatient Hospital Stay: Payer: BLUE CROSS/BLUE SHIELD | Admitting: Internal Medicine

## 2015-12-15 ENCOUNTER — Inpatient Hospital Stay: Payer: BLUE CROSS/BLUE SHIELD

## 2016-01-12 ENCOUNTER — Inpatient Hospital Stay (HOSPITAL_BASED_OUTPATIENT_CLINIC_OR_DEPARTMENT_OTHER): Payer: BLUE CROSS/BLUE SHIELD | Admitting: Internal Medicine

## 2016-01-12 ENCOUNTER — Ambulatory Visit
Admission: RE | Admit: 2016-01-12 | Discharge: 2016-01-12 | Disposition: A | Discharge status: Home / Self Care | Payer: BLUE CROSS/BLUE SHIELD | Source: Ambulatory Visit | Attending: Internal Medicine | Admitting: Internal Medicine

## 2016-01-12 ENCOUNTER — Inpatient Hospital Stay: Payer: BLUE CROSS/BLUE SHIELD | Attending: Internal Medicine

## 2016-01-12 VITALS — BP 134/92 | HR 89 | Temp 97.8°F | Resp 18 | Wt 182.0 lb

## 2016-01-12 DIAGNOSIS — I313 Pericardial effusion (noninflammatory): Secondary | ICD-10-CM | POA: Diagnosis not present

## 2016-01-12 DIAGNOSIS — Z79899 Other long term (current) drug therapy: Secondary | ICD-10-CM | POA: Insufficient documentation

## 2016-01-12 DIAGNOSIS — R079 Chest pain, unspecified: Secondary | ICD-10-CM

## 2016-01-12 DIAGNOSIS — I872 Venous insufficiency (chronic) (peripheral): Secondary | ICD-10-CM

## 2016-01-12 DIAGNOSIS — R600 Localized edema: Secondary | ICD-10-CM | POA: Diagnosis not present

## 2016-01-12 DIAGNOSIS — Z803 Family history of malignant neoplasm of breast: Secondary | ICD-10-CM

## 2016-01-12 DIAGNOSIS — Z86718 Personal history of other venous thrombosis and embolism: Secondary | ICD-10-CM | POA: Diagnosis not present

## 2016-01-12 DIAGNOSIS — D6851 Activated protein C resistance: Secondary | ICD-10-CM | POA: Diagnosis not present

## 2016-01-12 DIAGNOSIS — N6019 Diffuse cystic mastopathy of unspecified breast: Secondary | ICD-10-CM | POA: Insufficient documentation

## 2016-01-12 DIAGNOSIS — Z8041 Family history of malignant neoplasm of ovary: Secondary | ICD-10-CM | POA: Insufficient documentation

## 2016-01-12 DIAGNOSIS — Z809 Family history of malignant neoplasm, unspecified: Secondary | ICD-10-CM | POA: Insufficient documentation

## 2016-01-12 DIAGNOSIS — I8390 Asymptomatic varicose veins of unspecified lower extremity: Secondary | ICD-10-CM

## 2016-01-12 DIAGNOSIS — Z86711 Personal history of pulmonary embolism: Secondary | ICD-10-CM | POA: Diagnosis not present

## 2016-01-12 DIAGNOSIS — K8020 Calculus of gallbladder without cholecystitis without obstruction: Secondary | ICD-10-CM | POA: Insufficient documentation

## 2016-01-12 DIAGNOSIS — I82502 Chronic embolism and thrombosis of unspecified deep veins of left lower extremity: Secondary | ICD-10-CM | POA: Insufficient documentation

## 2016-01-12 DIAGNOSIS — R918 Other nonspecific abnormal finding of lung field: Secondary | ICD-10-CM | POA: Diagnosis not present

## 2016-01-12 DIAGNOSIS — R072 Precordial pain: Secondary | ICD-10-CM | POA: Diagnosis not present

## 2016-01-12 DIAGNOSIS — Z8781 Personal history of (healed) traumatic fracture: Secondary | ICD-10-CM | POA: Diagnosis not present

## 2016-01-12 DIAGNOSIS — Z7901 Long term (current) use of anticoagulants: Secondary | ICD-10-CM | POA: Insufficient documentation

## 2016-01-12 DIAGNOSIS — R0781 Pleurodynia: Secondary | ICD-10-CM | POA: Diagnosis not present

## 2016-01-12 DIAGNOSIS — J811 Chronic pulmonary edema: Secondary | ICD-10-CM | POA: Insufficient documentation

## 2016-01-12 DIAGNOSIS — E039 Hypothyroidism, unspecified: Secondary | ICD-10-CM

## 2016-01-12 DIAGNOSIS — I82401 Acute embolism and thrombosis of unspecified deep veins of right lower extremity: Secondary | ICD-10-CM

## 2016-01-12 DIAGNOSIS — Z808 Family history of malignant neoplasm of other organs or systems: Secondary | ICD-10-CM | POA: Insufficient documentation

## 2016-01-12 LAB — COMPREHENSIVE METABOLIC PANEL
ALBUMIN: 4 g/dL (ref 3.5–5.0)
ALK PHOS: 101 U/L (ref 38–126)
ALT: 15 U/L (ref 14–54)
AST: 17 U/L (ref 15–41)
Anion gap: 5 (ref 5–15)
BUN: 11 mg/dL (ref 6–20)
CALCIUM: 9.2 mg/dL (ref 8.9–10.3)
CO2: 29 mmol/L (ref 22–32)
CREATININE: 1.1 mg/dL — AB (ref 0.44–1.00)
Chloride: 103 mmol/L (ref 101–111)
GFR calc Af Amer: >60 mL/min (ref >60)
GFR calc non Af Amer: 56 mL/min — ABNORMAL LOW (ref >60)
GLUCOSE: 100 mg/dL — AB (ref 65–99)
Potassium: 3.2 mmol/L — ABNORMAL LOW (ref 3.5–5.1)
SODIUM: 137 mmol/L (ref 135–145)
Total Bilirubin: 0.3 mg/dL (ref 0.3–1.2)
Total Protein: 7.9 g/dL (ref 6.5–8.1)

## 2016-01-12 LAB — CBC WITH DIFFERENTIAL/PLATELET
BASOS PCT: 0 %
Basophils Absolute: 0 10*3/uL (ref 0–0.1)
EOS ABS: 0.1 10*3/uL (ref 0–0.7)
Eosinophils Relative: 2 %
HCT: 40.3 % (ref 35.0–47.0)
HEMOGLOBIN: 13.7 g/dL (ref 12.0–16.0)
Lymphocytes Relative: 37 %
Lymphs Abs: 2.9 10*3/uL (ref 1.0–3.6)
MCH: 29 pg (ref 26.0–34.0)
MCHC: 34 g/dL (ref 32.0–36.0)
MCV: 85.4 fL (ref 80.0–100.0)
Monocytes Absolute: 0.4 10*3/uL (ref 0.2–0.9)
Monocytes Relative: 5 %
NEUTROS PCT: 56 %
Neutro Abs: 4.3 10*3/uL (ref 1.4–6.5)
Platelets: 394 10*3/uL (ref 150–440)
RBC: 4.73 MIL/uL (ref 3.80–5.20)
RDW: 13.3 % (ref 11.5–14.5)
WBC: 7.8 10*3/uL (ref 3.6–11.0)

## 2016-01-12 MED ORDER — IOPAMIDOL (ISOVUE-370) INJECTION 76%
75.0000 mL | Freq: Once | INTRAVENOUS | Status: AC | PRN
  Administered 2016-01-12: 75 mL via INTRAVENOUS

## 2016-01-12 NOTE — Assessment & Plan Note (Addendum)
#   Recurrent DVT/factor V Leiden heterozygosity- ? Protein S def [done at time of acute DVT] [acquired risk factors include-limited mobility because of left ankle fracture/surgery].  On eliquis.   # Left sided chest wall pain- atypical- ? PE;  CTA PE protocol- NEG for PE;Pt is recommended cardiac evaluation.   #Left lower extremity postphlebitic syndrome- recommend Stockings when the patient is ambulatory.   # follow up in 6 months/labs.

## 2016-01-12 NOTE — Progress Notes (Signed)
[place] NOTE  Patient Care Team: Venia Carbon, MD as PCP - General Seeplaputhur Robinette Haines, 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; Superficial superficial vein- Left Medial LLE- started on Lovenox 46m BID; June 2017- On Eliquis BID  HISTORY OF PRESENTING ILLNESS:  RRyleigh Frederick 55y.o.  female above history of recurrent DVT; prior history of PE- currently on Eliquis.   Patient continues to complain of intermittent pain left chest wall area. More so in the last few days. 6-7 on a scale of 10. Intermittently hurts with a deep breath.  Patient denies any gum bleeding or nose bleeds. Denies any rectal bleeding or vaginal bleeding. No falls.  ROS: A complete 10 point review of system is done which is negative except mentioned above in history of present illness  MEDICAL HISTORY:  Past Medical History:  Diagnosis Date  . Asymptomatic varicose veins   . Diffuse cystic mastopathy   . DVT (deep venous thrombosis) ([place] 2003   With BCP's  . Edema of both legs   . Hypothyroidism   . Venous insufficiency     SURGICAL HISTORY: Past Surgical History:  Procedure Laterality Date  . DILATION AND CURETTAGE OF UTERUS  ~2003  . ORIF ANKLE FRACTURE Left 01/31/2015   Procedure: OPEN REDUCTION INTERNAL FIXATION (ORIF) DISTAL TIBIA  FRACTURE;  [name];  [name];  Service: Orthopedics;  Laterality: Left;  . PERIPHERAL VASCULAR CATHETERIZATION N/A 02/24/2015   Procedure: IVC Filter Insertion;  Surgeon: JAlgernon Huxley  MD;  [facility];  Service: Cardiovascular;  [name];  . PERIPHERAL VASCULAR CATHETERIZATION N/A 06/20/2015   Procedure: IVC Filter Removal;  [name];  [facility];  Service: Cardiovascular;  [name];  . TUBAL LIGATION  2005  . vein closure Left 2008   Left Leg  . Vein Procedure     Dr. SJamal Collin   SOCIAL HISTORY: Social History   Social History  . Marital status: Married    Spouse name: N/A  . Number of children: 1  . 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           Social History Main Topics  . Smoking status: Never Smoker  . Smokeless tobacco: Never Used  . Alcohol use No  . Drug use: No  . Sexual activity: Not on file   Other Topics Concern  . Not on file   Social History Narrative  . No narrative on file    FAMILY HISTORY: Family History  Problem Relation Age of Onset  . Heart disease Mother     CHF  . Diabetes Mother   . Hypothyroidism Mother   . Cancer Maternal Aunt     Breast and Ovarian  (Believes BRCA positive)  . Cancer Other     Breast and ovarian CA (Believes BRCA positive)  . Cancer Maternal Aunt     Melanoma  .  Cancer Father     ALLERGIES:  has No Known Allergies.  MEDICATIONS:  Current Outpatient Prescriptions  Medication Sig Dispense Refill  . apixaban (ELIQUIS) 5 MG TABS tablet Take 1 tablet (5 mg total) by mouth 2 (two) times daily. 180 tablet 4  . omeprazole (PRILOSEC) 20 MG capsule Take 1 capsule (20 mg total) by mouth daily as needed. 1 capsule 0  . SYNTHROID 112 MCG tablet TAKE 1 TABLET BY MOUTH EVERY DAY. 90 tablet 2   No current facility-administered medications for this visit.       Marland Kitchen  PHYSICAL EXAMINATION:   Vitals:   01/12/16 1350  BP: (!) 134/92  Pulse: 89  Resp: 18  Temp: 97.8 F (36.6 C)   Filed Weights   01/12/16 1350  Weight: 182 lb (82.6 kg)    GENERAL: Well-nourished  well-developed; Alert, no distress and comfortable.   Alone; walks with a cane.  EYES: no pallor or icterus OROPHARYNX: no thrush or ulceration; good dentition  NECK: supple, no masses felt LYMPH:  no palpable lymphadenopathy in the cervical, axillary or inguinal regions LUNGS: clear to auscultation and  No wheeze or crackles HEART/CVS: regular rate & rhythm and no murmurs; bilateral lower extremity swelling- left more than right.  ABDOMEN: abdomen soft, non-tender and normal bowel sounds Musculoskeletal:no cyanosis of digits and no clubbing  PSYCH: alert & oriented x 3 with fluent speech NEURO: no focal motor/sensory deficits SKIN:  no rashes or significant lesions  LABORATORY DATA:  I have reviewed the data as listed Lab Results  Component Value Date   WBC 7.8 01/12/2016   HGB 13.7 01/12/2016   HCT 40.3 01/12/2016   MCV 85.4 01/12/2016   PLT 394 01/12/2016    Recent Labs  02/02/15 0834  07/22/15 1224 09/14/15 1102 01/12/16 1322  NA 137  < > 137 135 137  K 3.7  < > 3.9 3.9 3.2*  CL 102  < > 104 101 103  CO2 29  < > 27 28 29   GLUCOSE 128*  < > 84 99 100*  BUN 11  < > 8 11 11   CREATININE 0.75  < > 0.67 0.73 1.10*  CALCIUM 8.6*  < > 9.4 9.2 9.2  GFRNONAA >60  --   --  >60 56*  GFRAA >60  --   --  >60 >60  PROT  --   < > 7.2 8.1 7.9  ALBUMIN  --   < > 4.1 4.2 4.0  AST  --   < > 15 17 17   ALT  --   < > 11 16 15   ALKPHOS  --   < > 95 102 101  BILITOT  --   < > 0.4 0.3 0.3  < > = values in this interval not displayed.  RADIOGRAPHIC STUDIES: I have personally reviewed the radiological images as listed and agreed with the findings in the report. Ct Angio Chest Pe W Or Wo Contrast  Result Date: 01/12/2016 CLINICAL DATA:  Pleuritic chest pain radiates to left shoulder into the back since Monday. No shortness of breath. History of DVT and pulmonary embolus. EXAM: CT ANGIOGRAPHY CHEST WITH CONTRAST TECHNIQUE: Multidetector CT imaging of the chest was performed using the  standard protocol during bolus administration of intravenous contrast. Multiplanar CT image reconstructions and MIPs were obtained to evaluate the vascular anatomy. CONTRAST:  75 cc Isovue 370 COMPARISON:  02/23/2015 FINDINGS: Cardiovascular: Trace pericardial effusion. Bovine arch anatomy. Thoracic aorta is normal in appearance. The  pulmonary arteries are well opacified. No acute pulmonary embolus identified. Mediastinum/Nodes: No mediastinal, hilar, or axillary adenopathy. Esophagus is normal in appearance. Lungs/Pleura: There is smooth septal wall thickening. Dependent changes are identified within the lungs, consistent with mild edema. No overt consolidations. No pleural effusions. Scattered emphysematous changes are noted. Upper Abdomen: Cholelithiasis. Splenules in the left upper quadrant of the abdomen. Musculoskeletal: No chest wall abnormality. No acute or significant osseous findings. Review of the MIP images confirms the above findings. IMPRESSION: 1. Technically adequate exam demonstrating no acute pulmonary embolus. 2. Changes of mild interstitial pulmonary edema. 3. Cholelithiasis. 4. Scattered emphysematous changes. Electronically Signed   By: Nolon Nations M.D.   On: 01/12/2016 15:59    ASSESSMENT & PLAN:   Leg DVT (deep venous thromboembolism), chronic, left ([place]) # Recurrent DVT/factor V Leiden heterozygosity- ? Protein S def [done at time of acute DVT] [acquired risk factors include-limited mobility because of left ankle fracture/surgery].  On eliquis.   # Left sided chest wall pain- atypical- ? PE;  CTA PE protocol- NEG for PE;Pt is recommended cardiac evaluation.   #Left lower extremity postphlebitic syndrome- recommend Stockings when the patient is ambulatory.   # follow up in 6 months/labs.       Cammie Sickle, MD 01/12/2016 4:58 PM

## 2016-01-12 NOTE — Progress Notes (Signed)
Patient is here for follow up, she mentions some left arm pain that hurts if she moves it back to far. She also mentions some pain in the left side under her ribs that radiates to her back near shoulder blade. She denies any shortness of breath or trouble breathing. She is worried since in the past she had blood clots.

## 2016-01-12 NOTE — Progress Notes (Signed)
Call report. Ct chest negative for PE. Pt d/c and sent home per md order. Continue on elqiuis as scheduled.

## 2016-01-19 ENCOUNTER — Ambulatory Visit (INDEPENDENT_AMBULATORY_CARE_PROVIDER_SITE_OTHER): Payer: BLUE CROSS/BLUE SHIELD | Admitting: Internal Medicine

## 2016-01-19 ENCOUNTER — Encounter: Payer: Self-pay | Admitting: Internal Medicine

## 2016-01-19 VITALS — BP 118/82 | HR 79 | Temp 97.9°F | Wt 182.0 lb

## 2016-01-19 DIAGNOSIS — R079 Chest pain, unspecified: Secondary | ICD-10-CM | POA: Insufficient documentation

## 2016-01-19 DIAGNOSIS — R0789 Other chest pain: Secondary | ICD-10-CM

## 2016-01-19 NOTE — Progress Notes (Signed)
Subjective:    Patient ID: Taylor Frederick, female    DOB: 1960/11/26, 55 y.o.   MRN: [id number]  HPI Here due to chest pain Reviewed Dr Aletha Halim note and the CT angio  ~10 days ago, "could feel it coming on" Pain was under the left breast and around back (by scapula) Felt similar to the blood clot but not as intense Not burning-- "almost like I feel it is tight" Heavy feeling but not sharp Chest didn't feel right--tried prilosec but didn't help Tylenol didn't help Couldn't get comfortable in the bed--had to prop herself up on the cough (but didn't seem to change with changes in position) Pain was not pleuritic  Since then--will feel a little discomfort but no recurrence of the more serious pain since then Saw Dr B 3 days later  Current Outpatient Prescriptions on File Prior to Visit  Medication Sig Dispense Refill  . apixaban (ELIQUIS) 5 MG TABS tablet Take 1 tablet (5 mg total) by mouth 2 (two) times daily. 180 tablet 4  . omeprazole (PRILOSEC) 20 MG capsule Take 1 capsule (20 mg total) by mouth daily as needed. 1 capsule 0  . SYNTHROID 112 MCG tablet TAKE 1 TABLET BY MOUTH EVERY DAY. 90 tablet 2   No current facility-administered medications on file prior to visit.     No Known Allergies  Past Medical History:  Diagnosis Date  . Asymptomatic varicose veins   . Diffuse cystic mastopathy   . DVT (deep venous thrombosis) ([place]) 2003   With BCP's  . Edema of both legs   . Hypothyroidism   . Venous insufficiency     Past Surgical History:  Procedure Laterality Date  . DILATION AND CURETTAGE OF UTERUS  ~2003  . ORIF ANKLE FRACTURE Left 01/31/2015   Procedure: OPEN REDUCTION INTERNAL FIXATION (ORIF) DISTAL TIBIA  FRACTURE;  Surgeon: Corky Mull, MD;  [name];  Service: Orthopedics;  Laterality: Left;  . PERIPHERAL VASCULAR CATHETERIZATION N/A 02/24/2015   Procedure: IVC Filter Insertion;  Surgeon: Algernon Huxley, MD;  Location: [place] [facility];   Service: Cardiovascular;  [name];  . PERIPHERAL VASCULAR CATHETERIZATION N/A 06/20/2015   Procedure: IVC Filter Removal;  Surgeon: Algernon Huxley, MD;  Location: [place] [facility];  Service: Cardiovascular;  [name];  . TUBAL LIGATION  2005  . vein closure Left 2008   Left Leg  . Vein Procedure     Dr. Jamal Collin    Family History  Problem Relation Age of Onset  . Heart disease Mother     CHF  . Diabetes Mother   . Hypothyroidism Mother   . Cancer Maternal Aunt     Breast and Ovarian  (Believes BRCA positive)  . Cancer Other     Breast and ovarian CA (Believes BRCA positive)  . Cancer Maternal Aunt     Melanoma  . Cancer Father     Social History   Social History  . Marital status: Married    Spouse name: N/A  . Number of children: 1  . 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           Social History Main Topics  . Smoking status: Never Smoker  . Smokeless tobacco: Never Used  . Alcohol use No  . Drug use: No  . Sexual activity: Not on file   Other Topics Concern  . Not on  file   Social History Narrative  . No narrative on file   Review of Systems No rash No cough No fever No chills or sweats No palpitations No new tasks or apparent injury prior to this    Objective:   Physical Exam  Constitutional: She appears well-developed and well-nourished. No distress.  Neck: Normal range of motion. Neck supple.  Cardiovascular: Normal rate, regular rhythm and normal heart sounds.  Exam reveals no gallop and no friction rub.   No murmur heard. Pulmonary/Chest: Effort normal and breath sounds normal. No respiratory distress. She has no wheezes. She has no rales. She exhibits no tenderness.  Musculoskeletal: She exhibits no edema.  Lymphadenopathy:    She has no cervical adenopathy.  Skin:  No rash along chest or back  Psychiatric: She has a normal mood and affect. Her behavior is normal.           Assessment & Plan:

## 2016-01-19 NOTE — Assessment & Plan Note (Signed)
She had bad night prompting the evaluation--but some lingering vague feeling Not pleuritic CT angio showed no pneumonia, recurrent PE, pulmonary infarct  Scant pericardial effusion but don't seem to be pericarditis This is not consistent with other cardiac disease like CAD No rash-- could be shingles syndrome without rash---but not likely Also, doesn't seem GI, but has been taking the prilosec Discussed trying single dose of ibuprofen if has bad spell

## 2016-01-19 NOTE — Progress Notes (Signed)
Pre visit review using our clinic review tool, if applicable. No additional management support is needed unless otherwise documented below in the visit note. 

## 2016-01-20 ENCOUNTER — Other Ambulatory Visit (INDEPENDENT_AMBULATORY_CARE_PROVIDER_SITE_OTHER): Payer: Self-pay | Admitting: Vascular Surgery

## 2016-01-20 DIAGNOSIS — I82409 Acute embolism and thrombosis of unspecified deep veins of unspecified lower extremity: Secondary | ICD-10-CM

## 2016-01-20 DIAGNOSIS — I824Y1 Acute embolism and thrombosis of unspecified deep veins of right proximal lower extremity: Secondary | ICD-10-CM

## 2016-01-24 ENCOUNTER — Encounter (INDEPENDENT_AMBULATORY_CARE_PROVIDER_SITE_OTHER): Payer: Self-pay | Admitting: Vascular Surgery

## 2016-01-24 ENCOUNTER — Ambulatory Visit (INDEPENDENT_AMBULATORY_CARE_PROVIDER_SITE_OTHER): Payer: BLUE CROSS/BLUE SHIELD

## 2016-01-24 ENCOUNTER — Ambulatory Visit (INDEPENDENT_AMBULATORY_CARE_PROVIDER_SITE_OTHER): Payer: BLUE CROSS/BLUE SHIELD | Admitting: Vascular Surgery

## 2016-01-24 VITALS — BP 135/77 | HR 76 | Resp 16 | Ht 63.0 in | Wt 179.0 lb

## 2016-01-24 DIAGNOSIS — I2782 Chronic pulmonary embolism: Secondary | ICD-10-CM | POA: Diagnosis not present

## 2016-01-24 DIAGNOSIS — I87091 Postthrombotic syndrome with other complications of right lower extremity: Secondary | ICD-10-CM | POA: Diagnosis not present

## 2016-01-24 DIAGNOSIS — I82409 Acute embolism and thrombosis of unspecified deep veins of unspecified lower extremity: Secondary | ICD-10-CM | POA: Diagnosis not present

## 2016-01-24 DIAGNOSIS — I824Y1 Acute embolism and thrombosis of unspecified deep veins of right proximal lower extremity: Secondary | ICD-10-CM

## 2016-01-24 DIAGNOSIS — D6851 Activated protein C resistance: Secondary | ICD-10-CM

## 2016-01-24 NOTE — Assessment & Plan Note (Signed)
The patient has some intermittent swelling of both lower extremities that is mild to moderate and not lifestyle limiting. She reports rare use of compression stockings at this point. She has no ulceration or infection. Overall, we discussed that should her symptoms of pain or swelling worsen wearing compression stockings regularly would be of benefit. Her follow-up duplex today shows only chronic appearing DVT in the right femoral and left popliteal veins with no acute DVT present. She is going to continue full anticoagulation secondary to her factor V Leiden disorder which I think is reasonable. She follows with a hematologist. I will see her back as needed.

## 2016-01-24 NOTE — Assessment & Plan Note (Signed)
Remains on anticoagulation. No recent worrisome symptoms.

## 2016-01-24 NOTE — Progress Notes (Signed)
MRN : [id number]  Taylor Frederick is a 55 y.o. (03/25/1960) female who presents with chief complaint of  Chief Complaint  Patient presents with  . Re-evaluation    Ultrasound follow up  .  History of Present Illness: Patient returns today in follow up of Her DVTs. She has some mild swelling in both lower extremities probably a little worse on the left than the right. She had severe surgery and trauma which bothers her legs a lot more than the mild swelling. She reports no ulceration or infection. She has not really been wearing compression stockings. Her duplex today shows only chronic appearing DVT in the right femoral vein and left popliteal vein without acute DVT  Current Outpatient Prescriptions  Medication Sig Dispense Refill  . apixaban (ELIQUIS) 5 MG TABS tablet Take 1 tablet (5 mg total) by mouth 2 (two) times daily. 180 tablet 4  . omeprazole (PRILOSEC) 20 MG capsule Take 1 capsule (20 mg total) by mouth daily as needed. 1 capsule 0  . SYNTHROID 112 MCG tablet TAKE 1 TABLET BY MOUTH EVERY DAY. 90 tablet 2   No current facility-administered medications for this visit.     Past Medical History:  Diagnosis Date  . Asymptomatic varicose veins   . Diffuse cystic mastopathy   . DVT (deep venous thrombosis) ([place]) 2003   With BCP's  . Edema of both legs   . Hypothyroidism   . Venous insufficiency     Past Surgical History:  Procedure Laterality Date  . DILATION AND CURETTAGE OF UTERUS  ~2003  . ORIF ANKLE FRACTURE Left 01/31/2015   Procedure: OPEN REDUCTION INTERNAL FIXATION (ORIF) DISTAL TIBIA  FRACTURE;  Surgeon: Corky Mull, MD;  [name];  Service: Orthopedics;  Laterality: Left;  . PERIPHERAL VASCULAR CATHETERIZATION N/A 02/24/2015   Procedure: IVC Filter Insertion;  Surgeon: Algernon Huxley, MD;  Location: [place] [facility];  Service: Cardiovascular;  [name];  . PERIPHERAL VASCULAR CATHETERIZATION N/A 06/20/2015   Procedure: IVC Filter Removal;   Surgeon: Algernon Huxley, MD;  Location: [place] [facility];  Service: Cardiovascular;  [name];  . TUBAL LIGATION  2005  . vein closure Left 2008   Left Leg  . Vein Procedure     Dr. Jamal Collin    Social History Social History  Substance Use Topics  . Smoking status: Never Smoker  . Smokeless tobacco: Never Used  . Alcohol use No     Family History Family History  Problem Relation Age of Onset  . Heart disease Mother     CHF  . Diabetes Mother   . Hypothyroidism Mother   . Cancer Maternal Aunt     Breast and Ovarian  (Believes BRCA positive)  . Cancer Other     Breast and ovarian CA (Believes BRCA positive)  . Cancer Maternal Aunt     Melanoma  . Cancer Father     No Known Allergies   REVIEW OF SYSTEMS (Negative unless checked)  Constitutional: _0 Weight loss  _1 Fever  _2 Chills Cardiac: _3 Chest pain   _4 Chest pressure   _5 Palpitations   _6 Shortness of breath when laying flat   _7 Shortness of breath at rest   _8 Shortness of breath with exertion. Vascular:  _9 Pain in legs with walking   _10 Pain in legs at rest   _11 Pain in legs when laying flat   _12 Claudication   _13 Pain in feet when walking  _14 Pain in feet at rest  _15 Pain in feet when laying  flat   _0 History of DVT   _1 Phlebitis   _2 Swelling in legs   _3 Varicose veins   _4 Non-healing ulcers Pulmonary:   _5 Uses home oxygen   _6 Productive cough   _7 Hemoptysis   _8 Wheeze  _9 COPD   _10 Asthma Neurologic:  _11 Dizziness  _12 Blackouts   _13 Seizures   _14 History of stroke   _15 History of TIA  _16 Aphasia   _17 Temporary blindness   _18 Dysphagia   _19 Weakness or numbness in arms   _20 Weakness or numbness in legs Musculoskeletal:  _21 Arthritis   _22 Joint swelling   _23 Joint pain   _24 Low back pain Hematologic:  _25 Easy bruising  _26 Easy bleeding   _27 Hypercoagulable state   _28 Anemic   Gastrointestinal:  _29 [name]   _30 Vomiting blood  _31 Gastroesophageal reflux/heartburn   _32 Abdominal pain Genitourinary:  _33 Chronic kidney disease   _34 Difficult  urination  _35 Frequent urination  _36 Burning with urination   _37 Hematuria Skin:  _38 Rashes   _39 Ulcers   _40 Wounds Psychological:  _41 History of anxiety   _42  History of major depression.  Physical Examination  BP 135/77 (BP Location: Right Arm)   Pulse 76   Resp 16   Ht _43  (1.6 m)   Wt 179 lb (81.2 kg)   BMI 31.71 kg/m  Gen:  WD/WN, NAD Head: [place]/AT, No temporalis wasting. Ear/Nose/Throat: Hearing grossly intact, nares w/o erythema or drainage, trachea midline Eyes: Conjunctiva clear. Sclera non-icteric Neck: Supple.  No JVD.  Pulmonary:  Good air movement, no use of accessory muscles.  Cardiac: RRR, normal S1, S2 Vascular:  Vessel Right Left  Radial Palpable Palpable                                   Gastrointestinal: soft, non-tender/non-distended. No guarding/reflex.  Musculoskeletal: M/S 5/5 throughout.  No deformity or atrophy. Trace to 1+ bilateral lower extremity edema. Neurologic: Sensation grossly intact in extremities.  Symmetrical.  Speech is fluent.  Psychiatric: Judgment intact, Mood & affect appropriate for pt's clinical situation. Dermatologic: No rashes or ulcers noted.  No cellulitis or open wounds. Lymph : No Cervical, Axillary, or Inguinal lymphadenopathy.      Labs Recent Results (from the past 2160 hour(s))  CBC with Differential     Status: None   Collection Time: 01/12/16  1:22 PM  Result Value Ref Range   WBC 7.8 3.6 - 11.0 K/uL   RBC 4.73 3.80 - 5.20 MIL/uL   Hemoglobin 13.7 12.0 - 16.0 g/dL   HCT 40.3 35.0 - 47.0 %   MCV 85.4 80.0 - 100.0 fL   MCH 29.0 26.0 - 34.0 pg   MCHC 34.0 32.0 - 36.0 g/dL   RDW 13.3 11.5 - 14.5 %   Platelets 394 150 - 440 K/uL   Neutrophils Relative % 56 %   Neutro Abs 4.3 1.4 - 6.5 K/uL   Lymphocytes Relative 37 %   Lymphs Abs 2.9 1.0 - 3.6 K/uL   Monocytes Relative 5 %   Monocytes Absolute 0.4 0.2 - 0.9 K/uL   Eosinophils Relative 2 %   Eosinophils Absolute 0.1 0 - 0.7 K/uL   Basophils Relative 0 %    Basophils Absolute 0.0 0 - 0.1 K/uL  Comprehensive metabolic panel     Status: Abnormal   Collection Time: 01/12/16  1:22 PM  Result Value Ref Range   Sodium 137 135 - 145 mmol/L   Potassium 3.2 (L) 3.5 - 5.1 mmol/L   Chloride 103 101 - 111 mmol/L  CO2 29 22 - 32 mmol/L   Glucose, Bld 100 (H) 65 - 99 mg/dL   BUN 11 6 - 20 mg/dL   Creatinine, Ser 1.10 (H) 0.44 - 1.00 mg/dL   Calcium 9.2 8.9 - 10.3 mg/dL   Total Protein 7.9 6.5 - 8.1 g/dL   Albumin 4.0 3.5 - 5.0 g/dL   AST 17 15 - 41 U/L   ALT 15 14 - 54 U/L   Alkaline Phosphatase 101 38 - 126 U/L   Total Bilirubin 0.3 0.3 - 1.2 mg/dL   GFR calc non Af Amer 56 (L) >60 mL/min   GFR calc Af Amer >60 >60 mL/min    Comment: (NOTE) The eGFR has been calculated using the CKD EPI equation. This calculation has not been validated in all clinical situations. eGFR's persistently <60 mL/min signify possible Chronic Kidney Disease.    Anion gap 5 5 - 15    Radiology Ct Angio Chest Pe W Or Wo Contrast  Result Date: 01/12/2016 CLINICAL DATA:  Pleuritic chest pain radiates to left shoulder into the back since Monday. No shortness of breath. History of DVT and pulmonary embolus. EXAM: CT ANGIOGRAPHY CHEST WITH CONTRAST TECHNIQUE: Multidetector CT imaging of the chest was performed using the standard protocol during bolus administration of intravenous contrast. Multiplanar CT image reconstructions and MIPs were obtained to evaluate the vascular anatomy. CONTRAST:  75 cc Isovue 370 COMPARISON:  02/23/2015 FINDINGS: Cardiovascular: Trace pericardial effusion. Bovine arch anatomy. Thoracic aorta is normal in appearance. The pulmonary arteries are well opacified. No acute pulmonary embolus identified. Mediastinum/Nodes: No mediastinal, hilar, or axillary adenopathy. Esophagus is normal in appearance. Lungs/Pleura: There is smooth septal wall thickening. Dependent changes are identified within the lungs, consistent with mild edema. No overt consolidations.  No pleural effusions. Scattered emphysematous changes are noted. Upper Abdomen: Cholelithiasis. Splenules in the left upper quadrant of the abdomen. Musculoskeletal: No chest wall abnormality. No acute or significant osseous findings. Review of the MIP images confirms the above findings. IMPRESSION: 1. Technically adequate exam demonstrating no acute pulmonary embolus. 2. Changes of mild interstitial pulmonary edema. 3. Cholelithiasis. 4. Scattered emphysematous changes. Electronically Signed   By: Nolon Nations M.D.   On: 01/12/2016 15:59      Assessment/Plan  Pulmonary embolism (HCC) Remains on anticoagulation. No recent worrisome symptoms.  Postthrombotic syndrome with other complications of right lower extremity The patient has some intermittent swelling of both lower extremities that is mild to moderate and not lifestyle limiting. She reports rare use of compression stockings at this point. She has no ulceration or infection. Overall, we discussed that should her symptoms of pain or swelling worsen wearing compression stockings regularly would be of benefit. Her follow-up duplex today shows only chronic appearing DVT in the right femoral and left popliteal veins with no acute DVT present. She is going to continue full anticoagulation secondary to her factor V Leiden disorder which I think is reasonable. She follows with a hematologist. I will see her back as needed.  Factor V Leiden mutation ([place]) On anticoagulation. Follows with hematology.    Leotis Pain, MD  01/24/2016 3:05 PM    This note was created with Dragon medical transcription system.  Any errors from dictation are purely unintentional

## 2016-01-24 NOTE — Assessment & Plan Note (Signed)
On anticoagulation. Follows with hematology.

## 2016-03-11 DIAGNOSIS — J0100 Acute maxillary sinusitis, unspecified: Secondary | ICD-10-CM | POA: Diagnosis not present

## 2016-05-24 IMAGING — US US EXTREM LOW VENOUS*L*
1 series · 13 of 24 positions shown · non-contrast
Comparison: 02/24/2015

CLINICAL DATA: Warm, painful, swollen left lower extremity 3 days.
Patient is on Eliquis.



[Series 1: us extrem low venous*left* · 0.08mm/px · 13 of 36 slices shown]
[im 1/36]
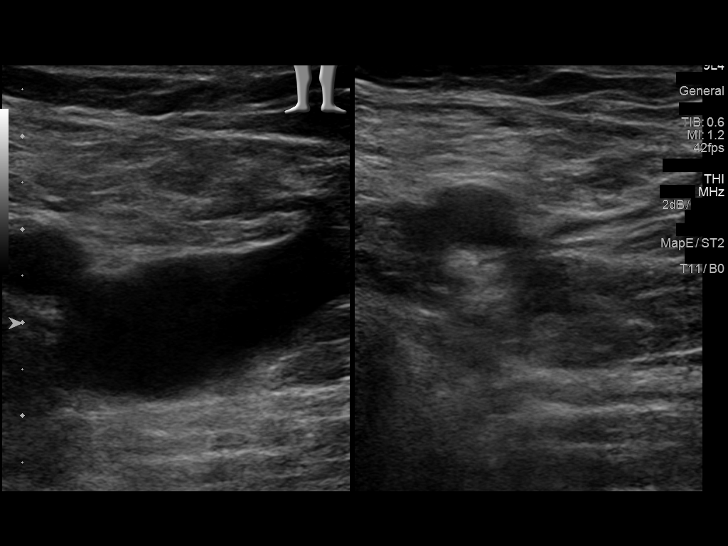
[im 4/36]
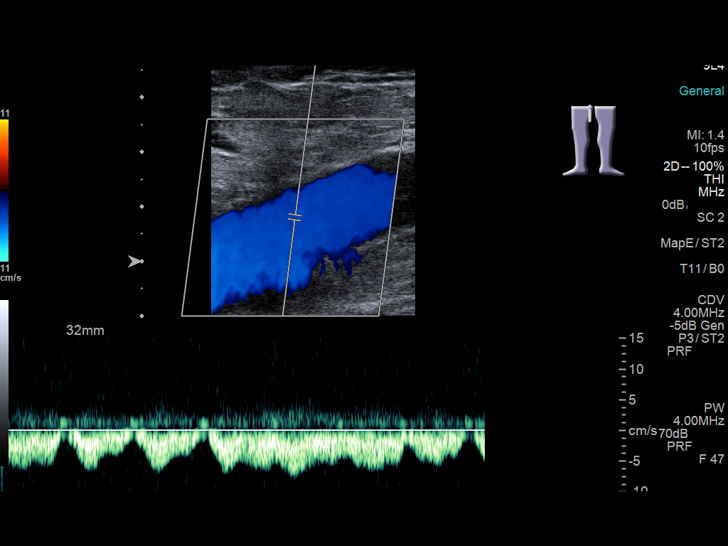
[im 7/36]
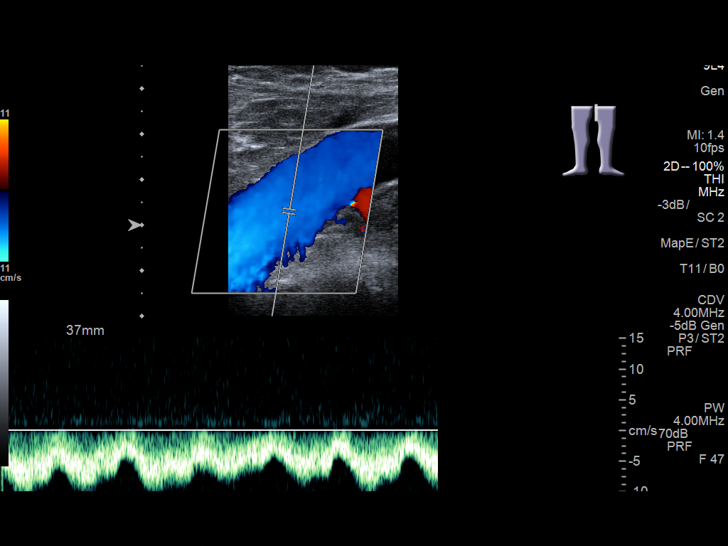
[im 10/36]
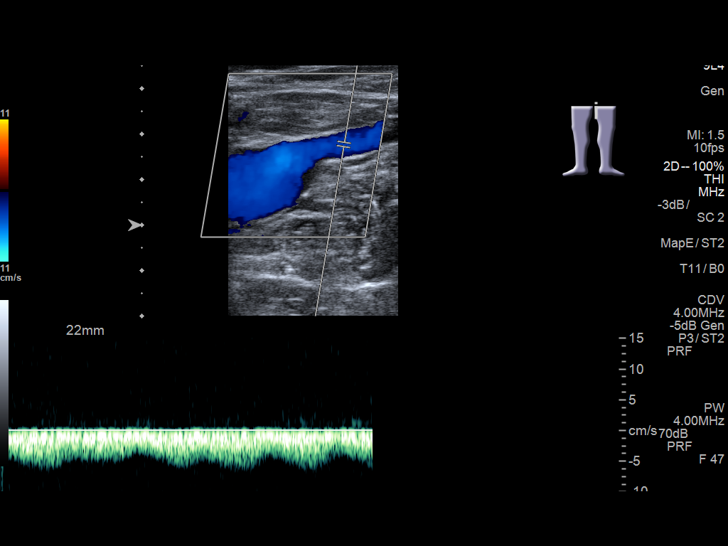
[im 13/36]
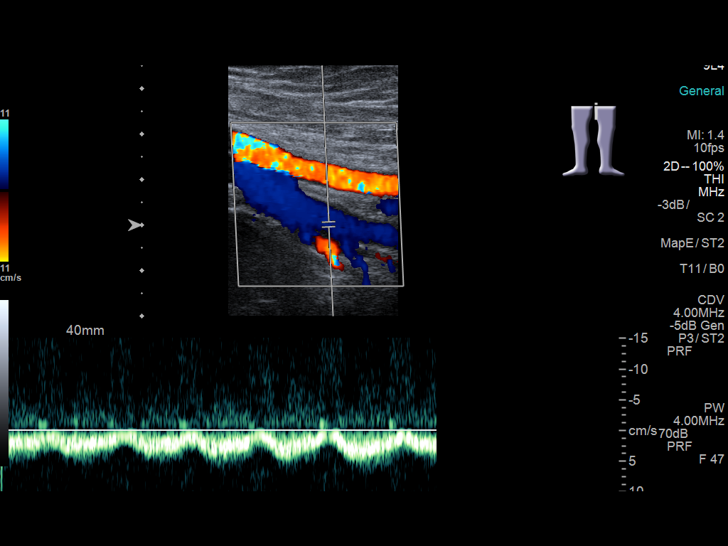
[im 16/36]
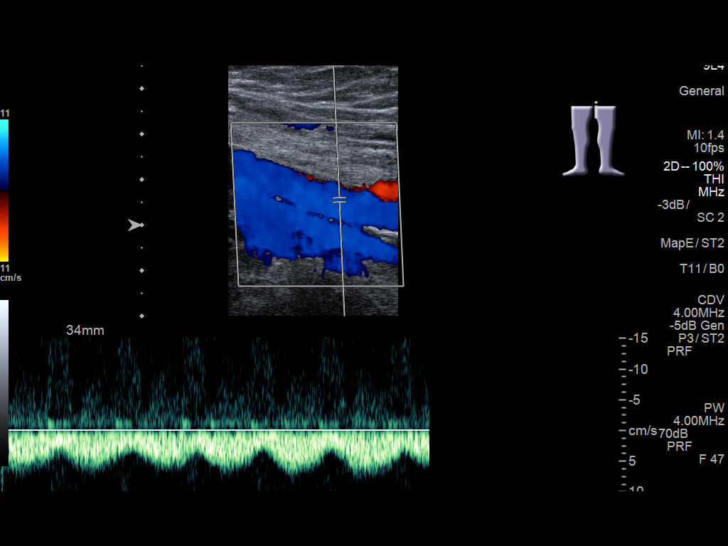
[im 19/36]
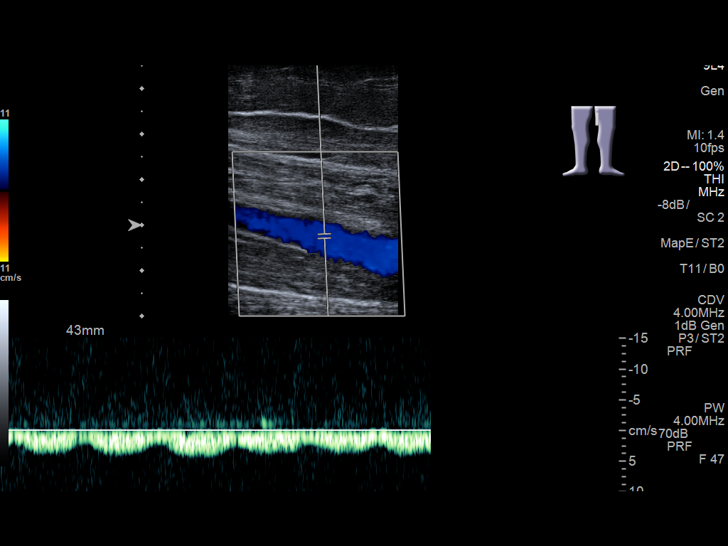
[im 20/36]
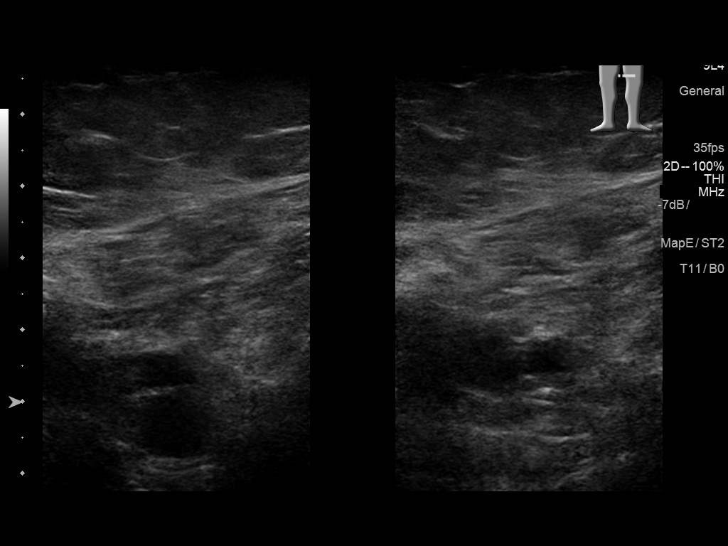
[im 23/36]
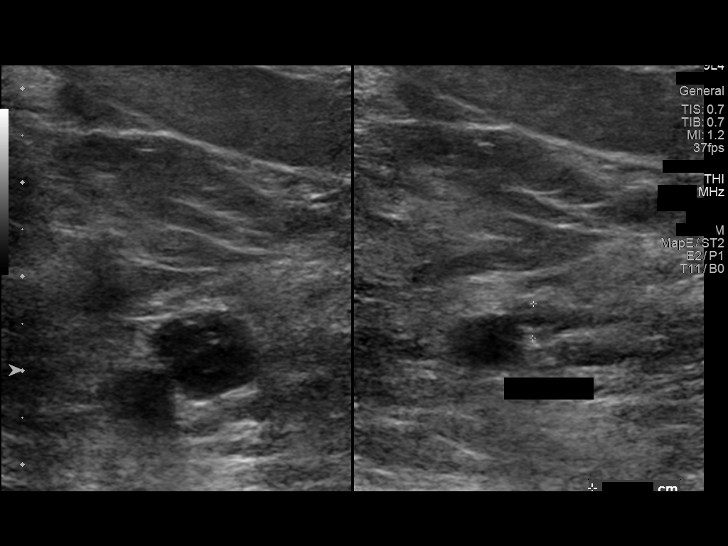
[im 26/36]
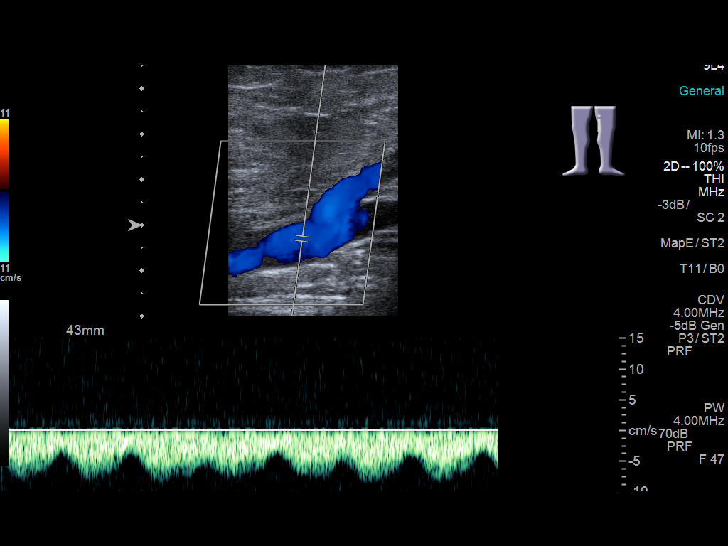
[im 29/36]
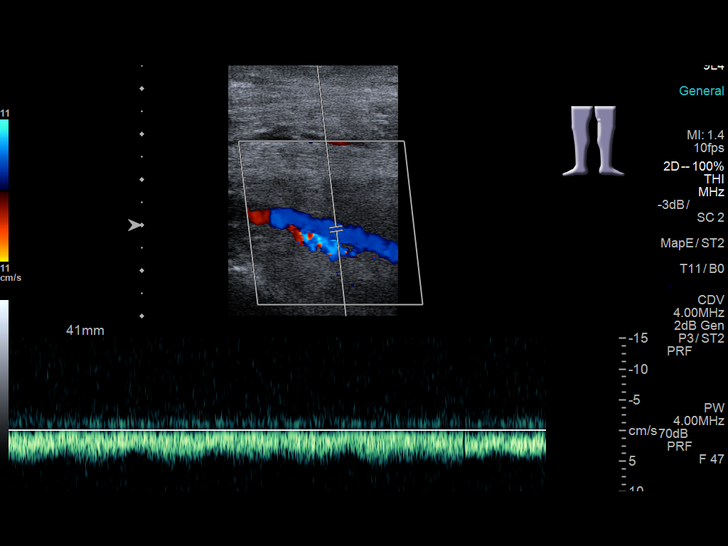
[im 32/36]
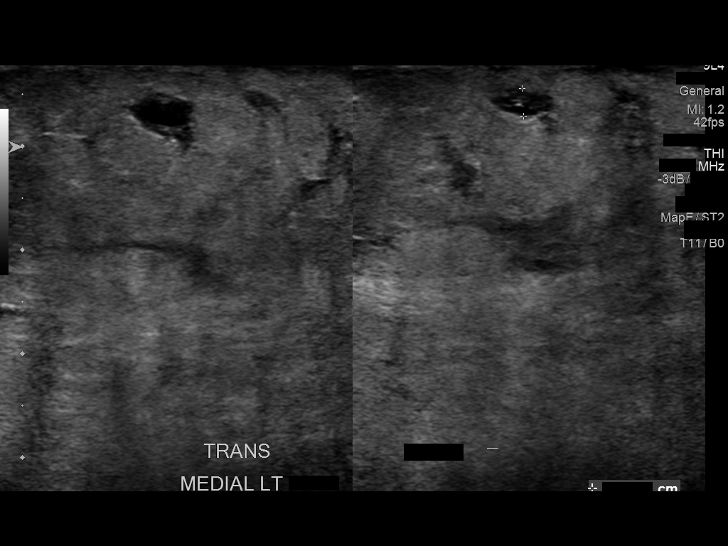
[im 36/36]
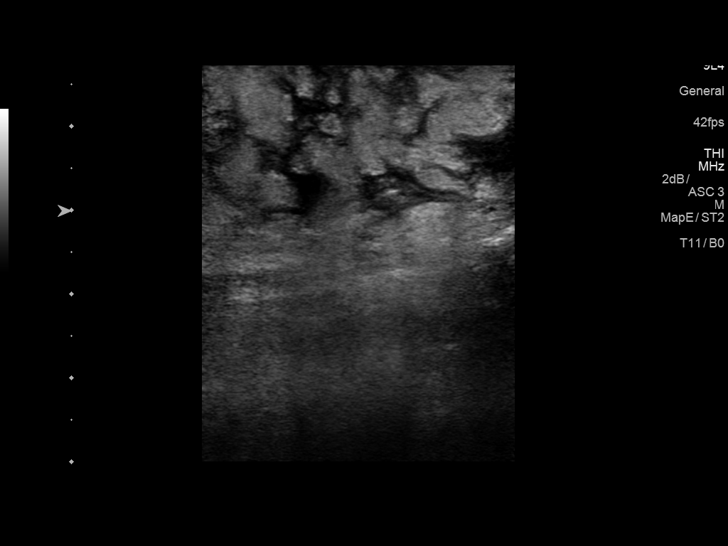

[13 of 24 positions shown; findings below may reference images not displayed]

FINDINGS: Contralateral Common Femoral Vein: Respiratory phasicity is normal
and symmetric with the symptomatic side. No evidence of thrombus.
Normal compressibility.

Common Femoral Vein: No evidence of thrombus. Normal
compressibility, respiratory phasicity and response to augmentation.

Saphenofemoral Junction: No evidence of thrombus. Normal
compressibility and flow on color Doppler imaging.

Profunda Femoral Vein: No evidence of thrombus. Normal
compressibility and flow on color Doppler imaging.

Femoral Vein: No evidence of thrombus. Normal compressibility,
respiratory phasicity and response to augmentation.

Popliteal Vein: Evidence of partially compressible nonocclusive
thrombus. The pain is expanded somewhat suggesting an acute
component some of this thrombus is likely chronic.

Calf Veins: Somewhat difficult to evaluate due to subcutaneous
edema. No evidence of thrombus. Normal compressibility and flow on
color Doppler imaging.

Superficial Great Saphenous Vein: No evidence of thrombus. Normal
compressibility and flow on color Doppler imaging.

Venous Reflux:  None.

Other Findings: Mild thrombus within superficial vein over the
medial left lower leg.
IMPRESSION: Nonocclusive thrombus within popliteal vein. This likely represents
a combination of acute and chronic thrombus.

Thrombosed superficial over the medial aspect of the left lower leg.

These results were called by telephone at the time of interpretation
on 07/25/2015 at [DATE] to Dr. KIRI JIM , who verbally
acknowledged these results.

## 2016-07-10 ENCOUNTER — Other Ambulatory Visit: Payer: Self-pay | Admitting: *Deleted

## 2016-07-10 DIAGNOSIS — I824Y9 Acute embolism and thrombosis of unspecified deep veins of unspecified proximal lower extremity: Secondary | ICD-10-CM

## 2016-07-12 ENCOUNTER — Inpatient Hospital Stay: Payer: BLUE CROSS/BLUE SHIELD | Attending: Internal Medicine

## 2016-07-12 ENCOUNTER — Inpatient Hospital Stay (HOSPITAL_BASED_OUTPATIENT_CLINIC_OR_DEPARTMENT_OTHER): Payer: BLUE CROSS/BLUE SHIELD | Admitting: Internal Medicine

## 2016-07-12 VITALS — BP 117/73 | HR 77 | Temp 97.0°F | Resp 14 | Wt 181.5 lb

## 2016-07-12 DIAGNOSIS — I872 Venous insufficiency (chronic) (peripheral): Secondary | ICD-10-CM | POA: Insufficient documentation

## 2016-07-12 DIAGNOSIS — N649 Disorder of breast, unspecified: Secondary | ICD-10-CM | POA: Diagnosis not present

## 2016-07-12 DIAGNOSIS — I82502 Chronic embolism and thrombosis of unspecified deep veins of left lower extremity: Secondary | ICD-10-CM

## 2016-07-12 DIAGNOSIS — Z86718 Personal history of other venous thrombosis and embolism: Secondary | ICD-10-CM | POA: Diagnosis not present

## 2016-07-12 DIAGNOSIS — Z86711 Personal history of pulmonary embolism: Secondary | ICD-10-CM | POA: Insufficient documentation

## 2016-07-12 DIAGNOSIS — Z7901 Long term (current) use of anticoagulants: Secondary | ICD-10-CM

## 2016-07-12 DIAGNOSIS — Z8041 Family history of malignant neoplasm of ovary: Secondary | ICD-10-CM | POA: Diagnosis not present

## 2016-07-12 DIAGNOSIS — Z803 Family history of malignant neoplasm of breast: Secondary | ICD-10-CM | POA: Diagnosis not present

## 2016-07-12 DIAGNOSIS — E039 Hypothyroidism, unspecified: Secondary | ICD-10-CM | POA: Diagnosis not present

## 2016-07-12 DIAGNOSIS — M7989 Other specified soft tissue disorders: Secondary | ICD-10-CM | POA: Diagnosis not present

## 2016-07-12 DIAGNOSIS — I8390 Asymptomatic varicose veins of unspecified lower extremity: Secondary | ICD-10-CM | POA: Insufficient documentation

## 2016-07-12 DIAGNOSIS — Z79899 Other long term (current) drug therapy: Secondary | ICD-10-CM

## 2016-07-12 DIAGNOSIS — R609 Edema, unspecified: Secondary | ICD-10-CM

## 2016-07-12 DIAGNOSIS — R600 Localized edema: Secondary | ICD-10-CM | POA: Insufficient documentation

## 2016-07-12 DIAGNOSIS — I824Y9 Acute embolism and thrombosis of unspecified deep veins of unspecified proximal lower extremity: Secondary | ICD-10-CM

## 2016-07-12 LAB — COMPREHENSIVE METABOLIC PANEL
ALBUMIN: 4.2 g/dL (ref 3.5–5.0)
ALT: 16 U/L (ref 14–54)
ANION GAP: 4 — AB (ref 5–15)
AST: 18 U/L (ref 15–41)
Alkaline Phosphatase: 110 U/L (ref 38–126)
BUN: 11 mg/dL (ref 6–20)
CALCIUM: 9.4 mg/dL (ref 8.9–10.3)
CO2: 29 mmol/L (ref 22–32)
CREATININE: 0.77 mg/dL (ref 0.44–1.00)
Chloride: 103 mmol/L (ref 101–111)
GFR calc non Af Amer: >60 mL/min (ref >60)
GLUCOSE: 97 mg/dL (ref 65–99)
POTASSIUM: 4 mmol/L (ref 3.5–5.1)
SODIUM: 136 mmol/L (ref 135–145)
TOTAL PROTEIN: 8.2 g/dL — AB (ref 6.5–8.1)
Total Bilirubin: 0.5 mg/dL (ref 0.3–1.2)

## 2016-07-12 LAB — CBC WITH DIFFERENTIAL/PLATELET
BASOS PCT: 0 %
Basophils Absolute: 0 10*3/uL (ref 0–0.1)
EOS ABS: 0.1 10*3/uL (ref 0–0.7)
EOS PCT: 1 %
HCT: 41.1 % (ref 35.0–47.0)
Hemoglobin: 14.2 g/dL (ref 12.0–16.0)
Lymphocytes Relative: 35 %
Lymphs Abs: 2.3 10*3/uL (ref 1.0–3.6)
MCH: 29.4 pg (ref 26.0–34.0)
MCHC: 34.4 g/dL (ref 32.0–36.0)
MCV: 85.4 fL (ref 80.0–100.0)
MONO ABS: 0.4 10*3/uL (ref 0.2–0.9)
MONOS PCT: 6 %
NEUTROS PCT: 58 %
Neutro Abs: 4 10*3/uL (ref 1.4–6.5)
Platelets: 415 10*3/uL (ref 150–440)
RBC: 4.82 MIL/uL (ref 3.80–5.20)
RDW: 13.1 % (ref 11.5–14.5)
WBC: 6.8 10*3/uL (ref 3.6–11.0)

## 2016-07-12 NOTE — Progress Notes (Signed)
[place] NOTE  Patient Care Team: Venia Carbon, [name], 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; Superficial superficial vein- Left Medial LLE- started on Lovenox 55m BID; June 2017- On Eliquis BID  HISTORY OF PRESENTING ILLNESS:  Taylor Frederick 56y.o.  female above history of recurrent DVT; prior history of PE- currently on Eliquis.   Patient denies any gum bleeding or nose bleeds. Denies any rectal bleeding or vaginal bleeding. No falls. Continues to complain of swelling in the legs chronic. She notes to have the swelling more in the evenings/ an improved in the mornings.  ROS: A complete 10 point review of system is done which is negative except mentioned above in history of present illness  MEDICAL HISTORY:  Past Medical History:  Diagnosis Date  . Asymptomatic varicose veins   . Diffuse cystic mastopathy   . DVT (deep [place] thrombosis) ([place]   With BCP's  . Edema of both legs   . Hypothyroidism   . Venous insufficiency     SURGICAL HISTORY: Past Surgical History:  Procedure Laterality Date  . DILATION AND CURETTAGE OF UTERUS  ~2003  . ORIF ANKLE FRACTURE Left 01/31/2015   Procedure: OPEN REDUCTION INTERNAL FIXATION (ORIF) DISTAL TIBIA  FRACTURE;  [name];  [name];  Service: Orthopedics;  Laterality: Left;  . PERIPHERAL VASCULAR CATHETERIZATION N/A 02/24/2015   Procedure: IVC Filter Insertion;  [name];  [facility];  Service: Cardiovascular;  [name];  . PERIPHERAL VASCULAR CATHETERIZATION N/A 06/20/2015   Procedure: IVC Filter Removal;  [name];  [facility];  Service: Cardiovascular;  [name];  . TUBAL LIGATION  2005  . vein closure Left 2008   Left Leg  . Vein Procedure     Dr. SJamal Collin   SOCIAL HISTORY: Social History   Social History  . Marital status: Married    Spouse name: N/A  . Number of children: 1  . 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           Social History Main Topics  . Smoking status: Never Smoker  . Smokeless tobacco: Never Used  . Alcohol use No  . Drug use: No  . Sexual activity: Not on file   Other Topics Concern  . Not on file   Social History Narrative  . No narrative on file    FAMILY HISTORY: Family History  Problem Relation Age of Onset  . Heart disease Mother        CHF  . Diabetes Mother   . Hypothyroidism Mother   . Cancer Maternal Aunt        Breast and Ovarian  (Believes BRCA positive)  . Cancer Other        Breast and ovarian CA (Believes BRCA positive)  . Cancer Maternal Aunt  Melanoma  . Cancer Father     ALLERGIES:  has No Known Allergies.  MEDICATIONS:  Current Outpatient Prescriptions  Medication Sig Dispense Refill  . apixaban (ELIQUIS) 5 MG TABS tablet Take 1 tablet (5 mg total) by mouth 2 (two) times daily. 180 tablet 4  . omeprazole (PRILOSEC) 20 MG capsule Take 1 capsule (20 mg total) by mouth daily as needed. 1 capsule 0  . SYNTHROID 112 MCG tablet TAKE 1 TABLET BY MOUTH EVERY DAY. 90 tablet 2   No current facility-administered medications for this visit.       Marland Kitchen  PHYSICAL EXAMINATION:   Vitals:   07/12/16 1104  BP: 117/73  Pulse: 77  Resp: 14  Temp: 97 F (36.1 C)   Filed Weights   07/12/16 1104  Weight: 181 lb 8 oz (82.3 kg)    GENERAL: Well-nourished well-developed; Alert, no  distress and comfortable.   Alone./  EYES: no pallor or icterus OROPHARYNX: no thrush or ulceration; good dentition  NECK: supple, no masses felt LYMPH:  no palpable lymphadenopathy in the cervical, axillary or inguinal regions LUNGS: clear to auscultation and  No wheeze or crackles HEART/CVS: regular rate & rhythm and no murmurs; bilateral lower extremity swelling- left more than right.  ABDOMEN: abdomen soft, non-tender and normal bowel sounds Musculoskeletal:no cyanosis of digits and no clubbing  PSYCH: alert & oriented x 3 with fluent speech NEURO: no focal motor/sensory deficits SKIN:  no rashes or significant lesions  LABORATORY DATA:  I have reviewed the data as listed Lab Results  Component Value Date   WBC 6.8 07/12/2016   HGB 14.2 07/12/2016   HCT 41.1 07/12/2016   MCV 85.4 07/12/2016   PLT 415 07/12/2016    Recent Labs  09/14/15 1102 01/12/16 1322 07/12/16 1034  NA 135 137 136  K 3.9 3.2* 4.0  CL 101 103 103  CO2 28 29 29   GLUCOSE 99 100* 97  BUN 11 11 11   CREATININE 0.73 1.10* 0.77  CALCIUM 9.2 9.2 9.4  GFRNONAA >60 56* >60  GFRAA >60 >60 >60  PROT 8.1 7.9 8.2*  ALBUMIN 4.2 4.0 4.2  AST 17 17 18   ALT 16 15 16   ALKPHOS 102 101 110  BILITOT 0.3 0.3 0.5    RADIOGRAPHIC STUDIES: I have personally reviewed the radiological images as listed and agreed with the findings in the report. No results found.  ASSESSMENT & PLAN:   Leg DVT (deep venous thromboembolism), chronic, left (HCC) # Recurrent DVT/factor V Leiden heterozygosity- ? Protein S def [done at time of acute DVT] [acquired risk factors include-limited mobility because of left ankle fracture/surgery].  On eliquis.   # Pt will need life long anti-coagulation [also pt preference];  will [place] Bil LE- prior- discussed re: continuing same dose vs decreasing dose [5 mg once a day].   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  Discussed at length measures to avoid trauma.   # follow up in 6 months/labs/ [place] Bil LE- prior- discussed re: continuing same dose vs decreasing dose [5 mg once a day].    Cammie Sickle, MD 07/12/2016 11:43 AM

## 2016-07-12 NOTE — Assessment & Plan Note (Addendum)
#   Recurrent DVT/factor V Leiden heterozygosity- ? Protein S def [done at time of acute DVT] [acquired risk factors include-limited mobility because of left ankle fracture/surgery].  On eliquis.   # Pt will need life long anti-coagulation [also pt preference];  will US Bil LE- prior- discussed re: continuing same dose vs decreasing dose [5 mg once a day].   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 Discussed at length measures to avoid trauma.   # follow up in 6 months/labs/ US Bil LE- prior- discussed re: continuing same dose vs decreasing dose [5 mg once a day].

## 2016-07-12 NOTE — Progress Notes (Signed)
Patient here today for follow today  

## 2016-07-20 ENCOUNTER — Encounter: Payer: BLUE CROSS/BLUE SHIELD | Admitting: Internal Medicine

## 2016-07-25 ENCOUNTER — Encounter: Payer: BLUE CROSS/BLUE SHIELD | Admitting: Internal Medicine

## 2016-09-03 ENCOUNTER — Ambulatory Visit: Payer: BLUE CROSS/BLUE SHIELD | Admitting: Advanced Practice Midwife

## 2016-09-24 ENCOUNTER — Encounter: Payer: Self-pay | Admitting: General Surgery

## 2016-09-24 DIAGNOSIS — Z1231 Encounter for screening mammogram for malignant neoplasm of breast: Secondary | ICD-10-CM | POA: Diagnosis not present

## 2016-10-02 ENCOUNTER — Ambulatory Visit: Payer: BLUE CROSS/BLUE SHIELD | Admitting: Obstetrics and Gynecology

## 2016-10-02 ENCOUNTER — Ambulatory Visit: Payer: BLUE CROSS/BLUE SHIELD | Admitting: General Surgery

## 2016-10-16 ENCOUNTER — Ambulatory Visit: Payer: BLUE CROSS/BLUE SHIELD | Admitting: General Surgery

## 2016-11-01 ENCOUNTER — Ambulatory Visit (INDEPENDENT_AMBULATORY_CARE_PROVIDER_SITE_OTHER): Payer: BLUE CROSS/BLUE SHIELD | Admitting: Obstetrics and Gynecology

## 2016-11-01 ENCOUNTER — Encounter: Payer: Self-pay | Admitting: Obstetrics and Gynecology

## 2016-11-01 ENCOUNTER — Telehealth: Payer: Self-pay | Admitting: Internal Medicine

## 2016-11-01 VITALS — BP 120/80 | HR 72 | Ht 63.0 in | Wt 184.0 lb

## 2016-11-01 DIAGNOSIS — Z01419 Encounter for gynecological examination (general) (routine) without abnormal findings: Secondary | ICD-10-CM | POA: Diagnosis not present

## 2016-11-01 DIAGNOSIS — Z803 Family history of malignant neoplasm of breast: Secondary | ICD-10-CM | POA: Diagnosis not present

## 2016-11-01 DIAGNOSIS — N898 Other specified noninflammatory disorders of vagina: Secondary | ICD-10-CM | POA: Diagnosis not present

## 2016-11-01 DIAGNOSIS — Z1211 Encounter for screening for malignant neoplasm of colon: Secondary | ICD-10-CM

## 2016-11-01 DIAGNOSIS — Z1321 Encounter for screening for nutritional disorder: Secondary | ICD-10-CM | POA: Diagnosis not present

## 2016-11-01 DIAGNOSIS — Z1231 Encounter for screening mammogram for malignant neoplasm of breast: Secondary | ICD-10-CM

## 2016-11-01 DIAGNOSIS — Z1329 Encounter for screening for other suspected endocrine disorder: Secondary | ICD-10-CM

## 2016-11-01 DIAGNOSIS — N951 Menopausal and female climacteric states: Secondary | ICD-10-CM

## 2016-11-01 DIAGNOSIS — Z1239 Encounter for other screening for malignant neoplasm of breast: Secondary | ICD-10-CM

## 2016-11-01 NOTE — Progress Notes (Signed)
PCP: Taylor Carbon, MD   Chief Complaint  Patient presents with  . Gynecologic Exam    HPI:      [name] is a 56 y.o. G1P1 who LMP was No LMP recorded. Patient is not currently having periods (Reason: Perimenopausal)., presents today for her annual examination.  Her menses are absent due to menopause. She does not have intermenstrual bleeding.  Sex activity: single partner, contraception - post menopausal status. She does have vaginal dryness and sex is impossible. She notes 2 places vaginally that are painful and itchy at times, and then resolve. They really flare with sex as well. She has not tried lubricants. SHE CAN'T HAVE ESTROGENS DUE TO HX OF 2 DVTs. Pt is on blood thinners and thinks she is Factor V Leiden positive.   Last Pap: Jul 21, 2015  Results were: no abnormalities /neg HPV DNA.  Hx of STDs: none  Last mammogram: September 24, 2016  Results were: normal--routine follow-up in 12 months. Followed by Dr. Jamal Collin. There is a FH of breast cancer in her mat aunt, who also had ovar ca, and a mat cousin. She has a 2nd aunt with melanoma. Pt's 2nd cousin did genetic testing and she thinks she was "positive". Pt has never had testing done. The patient does not do self-breast exams.  Colonoscopy: never; considering with PCP  Tobacco use: The patient denies current or previous tobacco use. Alcohol use: none Exercise: not active  She does not get adequate calcium and Vitamin D in her diet.  She complains of joint stiffness diffusely. She has a hx of Vit D deficiency in the past, treated with Rx Vit. No current supp. She does not exercise and has sit down job. She is trying to drink water.  She complains of fatigue as well, but has had normal labs with PCP.   Past Medical History:  Diagnosis Date  . Asymptomatic varicose veins   . Diffuse cystic mastopathy   . DVT (deep venous thrombosis) ([place]) 2003   With BCP's  . Edema of both legs   . Hypothyroidism   .  Venous insufficiency     Past Surgical History:  Procedure Laterality Date  . DILATION AND CURETTAGE OF UTERUS  ~2003  . LEG SURGERY  01/2015  . ORIF ANKLE FRACTURE Left 01/31/2015   Procedure: OPEN REDUCTION INTERNAL FIXATION (ORIF) DISTAL TIBIA  FRACTURE;  Surgeon: Corky Mull, MD;  [name];  Service: Orthopedics;  Laterality: Left;  . PERIPHERAL VASCULAR CATHETERIZATION N/A 02/24/2015   Procedure: IVC Filter Insertion;  Surgeon: Algernon Huxley, MD;  Location: [place] [facility];  Service: Cardiovascular;  [name];  . PERIPHERAL VASCULAR CATHETERIZATION N/A 06/20/2015   Procedure: IVC Filter Removal;  Surgeon: Algernon Huxley, MD;  Location: [place] [facility];  Service: Cardiovascular;  [name];  . TUBAL LIGATION  2005  . vein closure Left 2008   Left Leg  . Vein Procedure     Dr. Jamal Collin    Family History  Problem Relation Age of Onset  . Heart disease Mother        CHF  . Diabetes Mother   . Hypothyroidism Mother   . Cancer Maternal Aunt        Breast and Ovarian  (Believes BRCA positive)  . Cancer Other        Breast CA (Believes BRCA positive) (mom had br/ovar ca)  . Cancer Maternal Aunt  Melanoma  . Cancer Father        stomach    Social History   Social History  . Marital status: Married    Spouse name: N/A  . Number of children: 1  . 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           Social History Main Topics  . Smoking status: Never Smoker  . Smokeless tobacco: Never Used  . Alcohol use No  . Drug use: No  . Sexual activity: Yes   Other Topics Concern  . Not on file   Social History Narrative  . No narrative on file    No outpatient prescriptions have been marked as taking for the 11/01/16 encounter (Office Visit) with Copland, Alicia B, PA-C.      ROS:  Review of Systems  Constitutional: Negative for fatigue, fever and unexpected weight change.    Respiratory: Negative for cough, shortness of breath and wheezing.   Cardiovascular: Negative for chest pain, palpitations and leg swelling.  Gastrointestinal: Negative for blood in stool, constipation, diarrhea, nausea and vomiting.  Endocrine: Negative for cold intolerance, heat intolerance and polyuria.  Genitourinary: Positive for dyspareunia and vaginal pain. Negative for dysuria, flank pain, frequency, genital sores, hematuria, menstrual problem, pelvic pain, urgency, vaginal bleeding and vaginal discharge.  Musculoskeletal: Negative for back pain, joint swelling and myalgias.  Skin: Negative for rash.  Neurological: Negative for dizziness, syncope, light-headedness, numbness and headaches.  Hematological: Negative for adenopathy.  Psychiatric/Behavioral: Negative for agitation, confusion, sleep disturbance and suicidal ideas. The patient is not nervous/anxious.      Objective: BP 120/80   Pulse 72   Ht _0  (1.6 m)   Wt 184 lb (83.5 kg)   BMI 32.59 kg/m    Physical Exam  Constitutional: She is oriented to person, place, and time. She appears well-developed and well-nourished.  Genitourinary: Vagina normal and uterus normal. There is no rash or tenderness on the right labia. There is no rash or tenderness on the left labia.    No erythema or tenderness in the vagina. No vaginal discharge found. Right adnexum does not display mass and does not display tenderness. Left adnexum does not display mass and does not display tenderness. Cervix does not exhibit motion tenderness or polyp. Uterus is not enlarged or tender. Rectal exam shows no fissure, no tenderness and guaiac negative stool.  Genitourinary Comments: 2 SUPERFICIAL TEARS IN VAG SKIN; NO ULCERATIVE LESIONS; NO D/C, BLEEDING; EXT VAGINAL ATROPHY/PALE TISSUE;  Neck: Normal range of motion. No thyromegaly present.  Cardiovascular: Normal rate, regular rhythm and normal heart sounds.   No murmur heard. Pulmonary/Chest:  Effort normal and breath sounds normal. Right breast exhibits no mass, no nipple discharge, no skin change and no tenderness. Left breast exhibits no mass, no nipple discharge, no skin change and no tenderness.  Abdominal: Soft. There is no tenderness. There is no guarding.  Musculoskeletal: Normal range of motion.  Neurological: She is alert and oriented to person, place, and time. No cranial nerve deficit.  Psychiatric: She has a normal mood and affect. Her behavior is normal.  Vitals reviewed.   Results: Results for orders placed or performed in visit on 11/01/16 (from the past 24 hour(s))  POCT Occult Blood Stool     Status: Normal   Collection Time: 11/02/16 12:48 PM  Result Value Ref Range   Fecal Occult Blood, POC Negative Negative  Card #1 Date     Card #2 Fecal Occult Blod, POC     Card #2 Date     Card #3 Fecal Occult Blood, POC     Card #3 Date      Assessment/Plan:  Encounter for annual routine gynecological examination  Thyroid disorder screening - Due to joint pain. Will f/u with results. - Plan: TSH + free T4  Encounter for vitamin deficiency screening - Plan: VITAMIN D 25 Hydroxy (Vit-D Deficiency, Fractures)  Vaginal lesion - 2 superficial tears, most likely due to vaginal atrophy/trauma. Pt can't have ERT. Pt to try OTC hydrocortisone crm QHS for 2 wks and RTO for rechk in 2 wks. - Plan: HSV 2 antibody, IgG  Vaginal dryness, menopausal - Can't have ERT. Try coconut oil.  Screening for colon cancer - Neg FOBT. Colonoscopy recommended; pt may due trhough PCP. Handout on cologard given to pt. - Plan: POCT Occult Blood Stool  Family history of breast cancer - My Risk testing discussed and  handout given. Pt to consider and f/u prn.  Screening for breast cancer - Pt followed by Dr. Jamal Collin.          GYN counsel breast self exam, mammography screening, menopause, adequate intake of calcium and vitamin D, diet and exercise    F/U  Return in about 2 weeks (around  11/15/2016) for follow up pelvic.  Alicia B. Copland, PA-C 11/02/2016 12:53 PM

## 2016-11-02 DIAGNOSIS — N898 Other specified noninflammatory disorders of vagina: Secondary | ICD-10-CM | POA: Insufficient documentation

## 2016-11-02 DIAGNOSIS — Z1211 Encounter for screening for malignant neoplasm of colon: Secondary | ICD-10-CM | POA: Insufficient documentation

## 2016-11-02 LAB — HEMOCCULT GUIAC POC 1CARD (OFFICE): Fecal Occult Blood, POC: NEGATIVE

## 2016-11-02 LAB — HSV 2 ANTIBODY, IGG: HSV 2 IgG, Type Spec: <0.91 index (ref 0.00–0.90)

## 2016-11-02 LAB — TSH+FREE T4
FREE T4: 1.3 ng/dL (ref 0.82–1.77)
TSH: 1.1 u[IU]/mL (ref 0.450–4.500)

## 2016-11-02 LAB — VITAMIN D 25 HYDROXY (VIT D DEFICIENCY, FRACTURES): VIT D 25 HYDROXY: 18.7 ng/mL — AB (ref 30.0–100.0)

## 2016-11-06 ENCOUNTER — Telehealth: Payer: Self-pay | Admitting: Obstetrics and Gynecology

## 2016-11-06 DIAGNOSIS — E559 Vitamin D deficiency, unspecified: Secondary | ICD-10-CM

## 2016-11-06 NOTE — Telephone Encounter (Addendum)
Pt aware of Vit D deificiency. Add Vit D3 5000 IU daily. May be cause of joint aches.  RTO in 1 wk for vaginal lesion f/u. Neg HSV 2 IgG.

## 2016-11-06 NOTE — Telephone Encounter (Signed)
From what I can tell, I refilled it as the pharmacy requested it.

## 2016-11-06 NOTE — Telephone Encounter (Signed)
On Hx med list appears refills were DAW Synthroid; should pt have name brand instead of levothyroxine?

## 2016-11-06 NOTE — Telephone Encounter (Signed)
Pt is returning missed call. Please advise °

## 2016-11-06 NOTE — Telephone Encounter (Signed)
Pt left v/m requesting status of [facility]. Advised [facility]/16 and refill done on 11/01/16. Pt voiced understanding.

## 2016-11-15 ENCOUNTER — Ambulatory Visit: Payer: BLUE CROSS/BLUE SHIELD | Admitting: Obstetrics and Gynecology

## 2016-11-22 ENCOUNTER — Encounter: Payer: Self-pay | Admitting: Obstetrics and Gynecology

## 2016-11-22 ENCOUNTER — Ambulatory Visit (INDEPENDENT_AMBULATORY_CARE_PROVIDER_SITE_OTHER): Payer: BLUE CROSS/BLUE SHIELD | Admitting: Obstetrics and Gynecology

## 2016-11-22 VITALS — BP 110/70 | HR 71 | Ht 63.0 in | Wt 184.0 lb

## 2016-11-22 DIAGNOSIS — N898 Other specified noninflammatory disorders of vagina: Secondary | ICD-10-CM

## 2016-11-22 DIAGNOSIS — N951 Menopausal and female climacteric states: Secondary | ICD-10-CM

## 2016-11-22 NOTE — Progress Notes (Signed)
Chief Complaint  Patient presents with  . Follow-up    pelvic exam    HPI:      [name] is a 56 y.o. G1P1001 who LMP was No LMP recorded. Patient is not currently having periods (Reason: Perimenopausal)., presents today for f/u on 2 vaginal lesions from 11/01/16 annual. Pt has vaginal dryness and sex is very difficult. Pt doesn't use lubricants and was noted to have 2 vaginal tears/lesions at annual. Lesions are painful and itchy and pt states they come and go, particularly after sex. SHE CAN'T HAVE ESTROGENS DUE TO HX OF 2 DVTs. Pt is on blood thinners and thinks she is Factor V Leiden positive. She was instructed to try OTC hydrocortisone crm for 2 wks to see if lesions resolved. HSV 2 IgG was neg.  Pt states she is feeling much better vaginally but has been abstinent since her last appt. Vaginal areas are less irritated/itchy. She has not tried coconut oil as moisturizer yet.    Past Medical History:  Diagnosis Date  . Asymptomatic varicose veins   . Diffuse cystic mastopathy   . DVT (deep venous thrombosis) ([place]) 2003   With BCP's  . Edema of both legs   . Hypothyroidism   . Venous insufficiency     Past Surgical History:  Procedure Laterality Date  . DILATION AND CURETTAGE OF UTERUS  ~2003  . LEG SURGERY  01/2015  . ORIF ANKLE FRACTURE Left 01/31/2015   Procedure: OPEN REDUCTION INTERNAL FIXATION (ORIF) DISTAL TIBIA  FRACTURE;  Surgeon: Corky Mull, MD;  [name];  Service: Orthopedics;  Laterality: Left;  . PERIPHERAL VASCULAR CATHETERIZATION N/A 02/24/2015   Procedure: IVC Filter Insertion;  Surgeon: Algernon Huxley, MD;  Location: [place] [facility];  Service: Cardiovascular;  [name];  . PERIPHERAL VASCULAR CATHETERIZATION N/A 06/20/2015   Procedure: IVC Filter Removal;  Surgeon: Algernon Huxley, MD;  Location: [facility];  Service: Cardiovascular;  [name];  . TUBAL LIGATION  2005  . vein closure Left 2008   Left Leg  .  Vein Procedure     Dr. Jamal Collin    Family History  Problem Relation Age of Onset  . Heart disease Mother        CHF  . Diabetes Mother   . Hypothyroidism Mother   . Cancer Maternal Aunt        Breast and Ovarian  (Believes BRCA positive)  . Cancer Other        Breast CA (Believes BRCA positive) (mom had br/ovar ca)  . Cancer Maternal Aunt        Melanoma  . Cancer Father        stomach    Social History   Social History  . Marital status: Married    Spouse name: N/A  . Number of children: 1  . 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           Social History Main Topics  . Smoking status: Never Smoker  . Smokeless tobacco: Never Used  . Alcohol use No  . Drug use: No  . Sexual activity: Yes   Other Topics Concern  . Not on file   Social History Narrative  . No narrative on file     Current Outpatient Prescriptions:  .  apixaban (ELIQUIS) 5 MG TABS tablet, Take 1 tablet (5 mg total) by  mouth 2 (two) times daily., Disp: 180 tablet, Rfl: 4 .  levothyroxine (SYNTHROID) 112 MCG tablet, Take 1 tablet (112 mcg total) by mouth daily. MUST SCHEDULE OFFICE VISIT BEFORE NEXT REFILL, Disp: 90 tablet, Rfl: 0 .  omeprazole (PRILOSEC) 20 MG capsule, Take 1 capsule (20 mg total) by mouth daily as needed., Disp: 1 capsule, Rfl: 0   ROS:  Review of Systems  Constitutional: Negative for fever.  Gastrointestinal: Negative for blood in stool, constipation, diarrhea, nausea and vomiting.  Genitourinary: Positive for genital sores. Negative for dyspareunia, dysuria, flank pain, frequency, hematuria, urgency, vaginal bleeding, vaginal discharge and vaginal pain.  Musculoskeletal: Negative for back pain.  Skin: Negative for rash.     OBJECTIVE:   Vitals:  BP 110/70   Pulse 71   Ht 5' 3"  (1.6 m)   Wt 184 lb (83.5 kg)   BMI 32.59 kg/m   Physical Exam  Constitutional: She is oriented to person, place, and time and  well-developed, well-nourished, and in no distress.  Genitourinary: Vulva exhibits tenderness. Vulva exhibits no erythema, no exudate, no lesion and no rash.  Genitourinary Comments: VAG LESIONS RESOLVED; TENDER AT POST [place]E   Neurological: She is alert and oriented to person, place, and time.  Vitals reviewed.   Assessment/Plan: Vaginal lesion - Lesions resolved. Sx improved.   Vaginal dryness, menopausal - Mild vaginal atrophy on exam. Use coconut oil as moisturizer/lubricant. Sx will happen again if no lubricant used. Can't have vag ERT. F/u prn.     Return if symptoms worsen or fail to improve.  Jayceon Troy B. Senica Crall, PA-C 11/22/2016 9:18 AM

## 2016-11-28 ENCOUNTER — Other Ambulatory Visit: Payer: Self-pay | Admitting: *Deleted

## 2016-11-28 ENCOUNTER — Other Ambulatory Visit: Payer: Self-pay | Admitting: Internal Medicine

## 2016-11-28 DIAGNOSIS — I82401 Acute embolism and thrombosis of unspecified deep veins of right lower extremity: Secondary | ICD-10-CM

## 2016-11-28 DIAGNOSIS — I2699 Other pulmonary embolism without acute cor pulmonale: Secondary | ICD-10-CM

## 2016-11-28 MED ORDER — APIXABAN 5 MG PO TABS: 5.0000 mg | ORAL_TABLET | Freq: Two times a day (BID) | ORAL | 0 refills | Status: DC

## 2016-12-12 ENCOUNTER — Encounter: Payer: Self-pay | Admitting: *Deleted

## 2017-01-09 ENCOUNTER — Ambulatory Visit: Payer: BLUE CROSS/BLUE SHIELD

## 2017-01-10 ENCOUNTER — Ambulatory Visit
Admission: RE | Admit: 2017-01-10 | Discharge: 2017-01-10 | Disposition: A | Discharge status: Home / Self Care | Payer: BLUE CROSS/BLUE SHIELD | Source: Ambulatory Visit | Attending: Internal Medicine | Admitting: Internal Medicine

## 2017-01-10 DIAGNOSIS — R600 Localized edema: Secondary | ICD-10-CM

## 2017-01-11 ENCOUNTER — Inpatient Hospital Stay: Payer: BLUE CROSS/BLUE SHIELD | Attending: Internal Medicine

## 2017-01-11 ENCOUNTER — Inpatient Hospital Stay (HOSPITAL_BASED_OUTPATIENT_CLINIC_OR_DEPARTMENT_OTHER): Payer: BLUE CROSS/BLUE SHIELD | Admitting: Internal Medicine

## 2017-01-11 VITALS — BP 131/80 | HR 97 | Resp 14 | Wt 184.4 lb

## 2017-01-11 DIAGNOSIS — Z7901 Long term (current) use of anticoagulants: Secondary | ICD-10-CM

## 2017-01-11 DIAGNOSIS — I8390 Asymptomatic varicose veins of unspecified lower extremity: Secondary | ICD-10-CM

## 2017-01-11 DIAGNOSIS — Z800 Family history of malignant neoplasm of digestive organs: Secondary | ICD-10-CM

## 2017-01-11 DIAGNOSIS — Z808 Family history of malignant neoplasm of other organs or systems: Secondary | ICD-10-CM | POA: Insufficient documentation

## 2017-01-11 DIAGNOSIS — Z79899 Other long term (current) drug therapy: Secondary | ICD-10-CM | POA: Diagnosis not present

## 2017-01-11 DIAGNOSIS — N6019 Diffuse cystic mastopathy of unspecified breast: Secondary | ICD-10-CM

## 2017-01-11 DIAGNOSIS — Z803 Family history of malignant neoplasm of breast: Secondary | ICD-10-CM

## 2017-01-11 DIAGNOSIS — I872 Venous insufficiency (chronic) (peripheral): Secondary | ICD-10-CM

## 2017-01-11 DIAGNOSIS — Z86718 Personal history of other venous thrombosis and embolism: Secondary | ICD-10-CM | POA: Insufficient documentation

## 2017-01-11 DIAGNOSIS — M7989 Other specified soft tissue disorders: Secondary | ICD-10-CM

## 2017-01-11 DIAGNOSIS — E039 Hypothyroidism, unspecified: Secondary | ICD-10-CM

## 2017-01-11 DIAGNOSIS — I82502 Chronic embolism and thrombosis of unspecified deep veins of left lower extremity: Secondary | ICD-10-CM

## 2017-01-11 LAB — BASIC METABOLIC PANEL
Anion gap: 10 (ref 5–15)
BUN: 11 mg/dL (ref 6–20)
CHLORIDE: 100 mmol/L — AB (ref 101–111)
CO2: 25 mmol/L (ref 22–32)
Calcium: 9.4 mg/dL (ref 8.9–10.3)
Creatinine, Ser: 0.84 mg/dL (ref 0.44–1.00)
GFR calc non Af Amer: >60 mL/min (ref >60)
Glucose, Bld: 100 mg/dL — ABNORMAL HIGH (ref 65–99)
POTASSIUM: 4.4 mmol/L (ref 3.5–5.1)
Sodium: 135 mmol/L (ref 135–145)

## 2017-01-11 LAB — CBC WITH DIFFERENTIAL/PLATELET
Basophils Absolute: 0.1 10*3/uL (ref 0–0.1)
Basophils Relative: 1 %
EOS ABS: 0.1 10*3/uL (ref 0–0.7)
Eosinophils Relative: 1 %
HEMATOCRIT: 41.5 % (ref 35.0–47.0)
HEMOGLOBIN: 14 g/dL (ref 12.0–16.0)
LYMPHS ABS: 2.4 10*3/uL (ref 1.0–3.6)
LYMPHS PCT: 35 %
MCH: 29.2 pg (ref 26.0–34.0)
MCHC: 33.8 g/dL (ref 32.0–36.0)
MCV: 86.3 fL (ref 80.0–100.0)
MONOS PCT: 6 %
Monocytes Absolute: 0.4 10*3/uL (ref 0.2–0.9)
NEUTROS PCT: 57 %
Neutro Abs: 3.9 10*3/uL (ref 1.4–6.5)
Platelets: 384 10*3/uL (ref 150–440)
RBC: 4.81 MIL/uL (ref 3.80–5.20)
RDW: 13 % (ref 11.5–14.5)
WBC: 6.8 10*3/uL (ref 3.6–11.0)

## 2017-01-11 NOTE — Assessment & Plan Note (Addendum)
#   Recurrent DVT/factor V Leiden heterozygosity- ? Protein S def [done at time of acute DVT] [acquired risk factors include-limited mobility because of left ankle fracture/surgery].  On eliquis.   # Pt will need life long anti-coagulation [also pt preference]; bilateral lower extremity venous Dopplers negative for DVT.  We had a long discussion regarding lowering the dose of the Eliquis-given she will need long-term anticoagulation.  However patient is clinically asymptomatic at this dose; tolerating well; reluctant on cutting down the dose.  Continue current dose of Eliquis 5 mg twice daily.  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 Discussed at length measures to avoid trauma.   # follow up in 6 months/labs  # 25 minutes face-to-face with the patient discussing the above plan of care; more than 50% of time spent on prognosis/ natural history; counseling and coordination.

## 2017-01-11 NOTE — Progress Notes (Signed)
[facility] NOTE  Patient Care Team: Venia Carbon, [name], 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; Superficial superficial vein- Left Medial LLE- started on Lovenox 72m BID; June 2017- On Eliquis BID  HISTORY OF PRESENTING ILLNESS:  RKateryn Frederick 56y.o.  female above history of recurrent DVT; prior history of PE- currently on Eliquis.  She is also here to review the results of her ultrasound of the lower extremities.  Patient continues to complain of chronic swelling in the legs especially on the left side.  She does not wear compression stockings as recommended.  She denies any gum bleeding or easy bruising.  She denies any falls.  ROS: A complete 10 point review of system is done which is negative except mentioned above in history of present illness  MEDICAL HISTORY:  Past Medical History:  Diagnosis Date  . Asymptomatic varicose veins   . Diffuse cystic mastopathy   . DVT (deep venous thrombosis) ([place] 2003   With BCP's  . Edema of both legs   . Hypothyroidism   . Venous insufficiency     SURGICAL HISTORY: Past Surgical History:  Procedure Laterality Date  . DILATION AND CURETTAGE OF UTERUS  ~2003  . LEG SURGERY  01/2015  . TUBAL LIGATION  2005  . vein closure Left 2008   Left Leg  . Vein Procedure     Dr. SJamal Collin   SOCIAL HISTORY: Social History   Socioeconomic History  . Marital status: Married    Spouse name: Not on file  . Number of children: 1  . Years of education:  Not on file  . Highest education level: Not on file  Social Needs  . Financial resource strain: Not on file  . Food insecurity - worry: Not on file  . Food insecurity - inability: Not on file  . Transportation needs - medical: Not on file  . Transportation needs - non-medical: Not on file  Occupational History  . Occupation: OGlass blower/designer   Comment: BEducational psychologist . Occupation:      Comment:    Tobacco Use  . Smoking status: Never Smoker  . Smokeless tobacco: Never Used  Substance and Sexual Activity  . Alcohol use: No  . Drug use: No  . Sexual activity: Yes  Other Topics Concern  . Not on file  Social History Narrative  . Not on file    FAMILY HISTORY: Family History  Problem Relation Age of Onset  . Heart disease Mother        CHF  . Diabetes Mother   . Hypothyroidism Mother   . Cancer Maternal Aunt        Breast and Ovarian  (Believes BRCA positive)  . Cancer Other        Breast CA (Believes BRCA positive) (mom had br/ovar ca)  . Cancer Maternal Aunt        Melanoma  . Cancer Father        stomach  ALLERGIES:  is allergic to estrogens.  MEDICATIONS:  Current Outpatient Medications  Medication Sig Dispense Refill  . apixaban (ELIQUIS) 5 MG TABS tablet Take 1 tablet (5 mg total) by mouth 2 (two) times daily. 180 tablet 0  . levothyroxine (SYNTHROID) 112 MCG tablet Take 1 tablet (112 mcg total) by mouth daily. MUST SCHEDULE OFFICE VISIT BEFORE NEXT REFILL 90 tablet 0  . omeprazole (PRILOSEC) 20 MG capsule Take 1 capsule (20 mg total) by mouth daily as needed. 1 capsule 0   No current facility-administered medications for this visit.       Marland Kitchen  PHYSICAL EXAMINATION:   Vitals:   01/11/17 1202  BP: 131/80  Pulse: 97  Resp: 14   Filed Weights   01/11/17 1202  Weight: 184 lb 6.4 oz (83.6 kg)    GENERAL: Well-nourished well-developed; Alert, no distress and comfortable.   Alone./  EYES: no pallor or icterus OROPHARYNX: no thrush or  ulceration; good dentition  NECK: supple, no masses felt LYMPH:  no palpable lymphadenopathy in the cervical, axillary or inguinal regions LUNGS: clear to auscultation and  No wheeze or crackles HEART/CVS: regular rate & rhythm and no murmurs; bilateral lower extremity swelling- left more than right.  ABDOMEN: abdomen soft, non-tender and normal bowel sounds Musculoskeletal:no cyanosis of digits and no clubbing  PSYCH: alert & oriented x 3 with fluent speech NEURO: no focal motor/sensory deficits SKIN:  no rashes or significant lesions  LABORATORY DATA:  I have reviewed the data as listed Lab Results  Component Value Date   WBC 6.8 01/11/2017   HGB 14.0 01/11/2017   HCT 41.5 01/11/2017   MCV 86.3 01/11/2017   PLT 384 01/11/2017   Recent Labs    07/12/16 1034 01/11/17 1148  NA 136 135  K 4.0 4.4  CL 103 100*  CO2 29 25  GLUCOSE 97 100*  BUN 11 11  CREATININE 0.77 0.84  CALCIUM 9.4 9.4  GFRNONAA >60 >60  GFRAA >60 >60  PROT 8.2*  --   ALBUMIN 4.2  --   AST 18  --   ALT 16  --   ALKPHOS 110  --   BILITOT 0.5  --     RADIOGRAPHIC STUDIES: I have personally reviewed the radiological images as listed and agreed with the findings in the report. US Venous Img Lower Bilateral  Result Date: 01/10/2017 CLINICAL DATA:  Bilateral lower extremity pain and edema for the past year. History of DVT and pulmonary embolism. Evaluate for acute or chronic DVT. EXAM: BILATERAL LOWER EXTREMITY VENOUS DOPPLER ULTRASOUND TECHNIQUE: Gray-scale sonography with graded compression, as well as color Doppler and duplex ultrasound were performed to evaluate the lower extremity deep venous systems from the level of the common femoral vein and including the common femoral, femoral, profunda femoral, popliteal and calf veins including the posterior tibial, peroneal and gastrocnemius veins when visible. The superficial great saphenous vein was also interrogated. Spectral Doppler was utilized to evaluate  flow at rest and with distal augmentation maneuvers in the common femoral, femoral and popliteal veins. COMPARISON:  Left lower extreme venous Doppler ultrasound - 07/25/2015 FINDINGS: RIGHT LOWER EXTREMITY Common Femoral Vein: No evidence of thrombus. Normal compressibility, respiratory phasicity and response to augmentation. Saphenofemoral Junction: No evidence of thrombus. Normal compressibility and flow on color Doppler imaging. Profunda Femoral Vein: No evidence of thrombus. Normal compressibility and flow on color Doppler imaging. Femoral Vein: No evidence of thrombus. Normal compressibility, respiratory phasicity and response to augmentation. Popliteal Vein: No  evidence of thrombus. Normal compressibility, respiratory phasicity and response to augmentation. Calf Veins: No evidence of thrombus. Normal compressibility and flow on color Doppler imaging. Superficial Great Saphenous Vein: No evidence of thrombus. Normal compressibility. Venous Reflux:  None. Other Findings:  None. LEFT LOWER EXTREMITY Common Femoral Vein: No evidence of thrombus. Normal compressibility, respiratory phasicity and response to augmentation. Saphenofemoral Junction: No evidence of thrombus. Normal compressibility and flow on color Doppler imaging. Profunda Femoral Vein: No evidence of thrombus. Normal compressibility and flow on color Doppler imaging. Femoral Vein: No evidence of thrombus. Normal compressibility, respiratory phasicity and response to augmentation. Popliteal Vein: No evidence of acute or chronic thrombus. Normal compressibility, respiratory phasicity and response to augmentation. Calf Veins: No evidence of thrombus. Normal compressibility and flow on color Doppler imaging. Superficial Great Saphenous Vein: No evidence of thrombus. Normal compressibility. Venous Reflux:  None. Other Findings:  None. IMPRESSION: No evidence acute or chronic DVT within either lower extremity with special attention paid to the left  popliteal vein. Electronically Signed   By: Sandi Mariscal M.D.   On: 01/10/2017 09:22    ASSESSMENT & PLAN:   Leg DVT (deep venous thromboembolism), chronic, left ([place]) # Recurrent DVT/factor V Leiden heterozygosity- ? Protein S def [done at time of acute DVT] [acquired risk factors include-limited mobility because of left ankle fracture/surgery].  On eliquis.   # Pt will need life long anti-coagulation [also pt preference]; bilateral lower extremity venous Dopplers negative for DVT.  We had a long discussion regarding lowering the dose of the Eliquis-given she will need long-term anticoagulation.  However patient is clinically asymptomatic at this dose; tolerating well; reluctant on cutting down the dose.  Continue current dose of Eliquis 5 mg twice daily.  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 Discussed at length measures to avoid trauma.   # follow up in 6 months/labs  # 25 minutes face-to-face with the patient discussing the above plan of care; more than 50% of time spent on prognosis/ natural history; counseling and coordination.    Cammie Sickle, MD 01/14/2017 2:26 PM

## 2017-02-04 ENCOUNTER — Encounter: Payer: Self-pay | Admitting: General Surgery

## 2017-02-04 ENCOUNTER — Ambulatory Visit: Payer: BLUE CROSS/BLUE SHIELD | Admitting: General Surgery

## 2017-02-04 VITALS — BP 120/70 | HR 74 | Resp 14 | Ht 67.0 in | Wt 184.0 lb

## 2017-02-04 DIAGNOSIS — N6011 Diffuse cystic mastopathy of right breast: Secondary | ICD-10-CM | POA: Diagnosis not present

## 2017-02-04 DIAGNOSIS — N6012 Diffuse cystic mastopathy of left breast: Secondary | ICD-10-CM | POA: Diagnosis not present

## 2017-02-04 DIAGNOSIS — Z803 Family history of malignant neoplasm of breast: Secondary | ICD-10-CM

## 2017-02-04 MED ORDER — LEVOTHYROXINE SODIUM 112 MCG PO TABS: 112.0000 ug | ORAL_TABLET | Freq: Every day | ORAL | 3 refills | Status: DC

## 2017-02-04 NOTE — Patient Instructions (Addendum)
Patient to return to her OBGYN for mammograms and breast checks.The patient is aware to call back for any questions or concerns. 

## 2017-02-04 NOTE — Progress Notes (Signed)
Patient ID: Taylor Frederick, female   DOB: 1960/07/09, 56 y.o.   MRN: [id number]  Chief Complaint  Patient presents with  . Follow-up    HPI Taylor Frederick is a 56 y.o. female who presents for a breast evaluation. The most recent mammogram was done on 09/24/2016 .  Patient does perform regular self breast checks and gets regular mammograms done.  Has history of multiple breast cysts and prior aspirations She also has Factor V deficiency , history of PE. Currently on Eliquis  HPI  Past Medical History:  Diagnosis Date  . Asymptomatic varicose veins   . Diffuse cystic mastopathy   . DVT (deep venous thrombosis) ([place]) 2003   With BCP's  . Edema of both legs   . Hypothyroidism   . Venous insufficiency     Past Surgical History:  Procedure Laterality Date  . DILATION AND CURETTAGE OF UTERUS  ~2003  . LEG SURGERY  01/2015  . ORIF ANKLE FRACTURE Left 01/31/2015   Procedure: OPEN REDUCTION INTERNAL FIXATION (ORIF) DISTAL TIBIA  FRACTURE;  Surgeon: Corky Mull, MD;  [name];  Service: Orthopedics;  Laterality: Left;  . PERIPHERAL VASCULAR CATHETERIZATION N/A 02/24/2015   Procedure: IVC Filter Insertion;  Surgeon: Algernon Huxley, MD;  Location: [facility];  Service: Cardiovascular;  [name];  . PERIPHERAL VASCULAR CATHETERIZATION N/A 06/20/2015   Procedure: IVC Filter Removal;  Surgeon: Algernon Huxley, MD;  Location: [facility];  Service: Cardiovascular;  [name];  . TUBAL LIGATION  2005  . vein closure Left 2008   Left Leg  . Vein Procedure     Dr. Jamal Collin    Family History  Problem Relation Age of Onset  . Heart disease Mother        CHF  . Diabetes Mother   . Hypothyroidism Mother   . Cancer Maternal Aunt        Breast and Ovarian  (Believes BRCA positive)  . Cancer Other        Breast CA (Believes BRCA positive) (mom had br/ovar ca)  . Cancer Maternal Aunt        Melanoma  . Cancer Father        stomach    Social  History Social History   Tobacco Use  . Smoking status: Never Smoker  . Smokeless tobacco: Never Used  Substance Use Topics  . Alcohol use: No  . Drug use: No    Allergies  Allergen Reactions  . Estrogens Other (See Comments)    Hx of 2 DVTs    Current Outpatient Medications  Medication Sig Dispense Refill  . apixaban (ELIQUIS) 5 MG TABS tablet Take 1 tablet (5 mg total) by mouth 2 (two) times daily. 180 tablet 0  . levothyroxine (SYNTHROID) 112 MCG tablet Take 1 tablet (112 mcg total) by mouth daily. MUST SCHEDULE OFFICE VISIT BEFORE NEXT REFILL 90 tablet 0  . omeprazole (PRILOSEC) 20 MG capsule Take 1 capsule (20 mg total) by mouth daily as needed. 1 capsule 0  . levothyroxine (SYNTHROID) 112 MCG tablet Take 1 tablet (112 mcg total) by mouth daily before breakfast. 30 tablet 3   No current facility-administered medications for this visit.     Review of Systems Review of Systems  Constitutional: Negative.   Respiratory: Negative.   Cardiovascular: Negative.     Blood pressure 120/70, pulse 74, resp. rate 14, height _0  (1.702 m), weight 184 lb (83.5 kg).  Physical Exam Physical  Exam  Constitutional: She is oriented to person, place, and time. She appears well-developed and well-nourished.  Eyes: Conjunctivae are normal. No scleral icterus.  Neck: Neck supple.  Cardiovascular: Normal rate, regular rhythm and normal heart sounds.  Pulmonary/Chest: Effort normal and breath sounds normal. Right breast exhibits no inverted nipple, no mass, no nipple discharge, no skin change and no tenderness. Left breast exhibits no inverted nipple, no mass, no nipple discharge, no skin change and no tenderness.  Lymphadenopathy:    She has no cervical adenopathy.    She has no axillary adenopathy.  Neurological: She is alert and oriented to person, place, and time.  Skin: Skin is warm and dry.    Data Reviewed Mammogram reviewed  Assessment    Fibrocystic disease bilateral,  stable exam.  Remote family history of breast and ovarian cancer Hypothyroidism Factor V Leyden heterozygosity History of DVT and PE Plan    Synthroid sent in for one year.  Patient to return to her OBGYN for mammograms and breast checks.  Continue to follow with hematologist. The patient is aware to call back for any questions or concerns.     HPI, Physical Exam, Assessment and Plan have been scribed under the direction and in the presence of Mckinley Jewel, MD  Gaspar Cola, CMA  I have completed the exam and reviewed the above documentation for accuracy and completeness.  I agree with the above.  Haematologist has been used and any errors in dictation or transcription are unintentional.  Seeplaputhur G. Jamal Collin, M.D., F.A.C.S.    Junie Panning G 02/06/2017, 9:08 AM

## 2017-03-07 ENCOUNTER — Telehealth: Payer: Self-pay | Admitting: *Deleted

## 2017-03-07 ENCOUNTER — Other Ambulatory Visit: Payer: Self-pay | Admitting: Internal Medicine

## 2017-03-07 DIAGNOSIS — I82401 Acute embolism and thrombosis of unspecified deep veins of right lower extremity: Secondary | ICD-10-CM

## 2017-03-07 NOTE — Telephone Encounter (Signed)
Received request for refill on synthroid. She states that Dr Evette CristalSankar told her at her last visit that he would send in RX for her synthroid. She would like to make it a mail order RX not local pharmacy. She is aware that she will ned to go back to her PCP once this RX is completed, pt agrees. RX sent.

## 2017-07-12 ENCOUNTER — Inpatient Hospital Stay (HOSPITAL_BASED_OUTPATIENT_CLINIC_OR_DEPARTMENT_OTHER): Payer: BLUE CROSS/BLUE SHIELD | Admitting: Internal Medicine

## 2017-07-12 ENCOUNTER — Inpatient Hospital Stay: Payer: BLUE CROSS/BLUE SHIELD | Attending: Internal Medicine

## 2017-07-12 VITALS — BP 117/78 | HR 78 | Temp 98.7°F | Resp 16 | Wt 185.6 lb

## 2017-07-12 DIAGNOSIS — I82502 Chronic embolism and thrombosis of unspecified deep veins of left lower extremity: Secondary | ICD-10-CM

## 2017-07-12 DIAGNOSIS — M791 Myalgia, unspecified site: Secondary | ICD-10-CM | POA: Insufficient documentation

## 2017-07-12 DIAGNOSIS — M7989 Other specified soft tissue disorders: Secondary | ICD-10-CM | POA: Insufficient documentation

## 2017-07-12 DIAGNOSIS — D6851 Activated protein C resistance: Secondary | ICD-10-CM | POA: Insufficient documentation

## 2017-07-12 DIAGNOSIS — Z7901 Long term (current) use of anticoagulants: Secondary | ICD-10-CM | POA: Diagnosis not present

## 2017-07-12 DIAGNOSIS — Z86711 Personal history of pulmonary embolism: Secondary | ICD-10-CM | POA: Diagnosis not present

## 2017-07-12 DIAGNOSIS — R5382 Chronic fatigue, unspecified: Secondary | ICD-10-CM | POA: Diagnosis not present

## 2017-07-12 LAB — CBC WITH DIFFERENTIAL/PLATELET
BASOS ABS: 0.1 10*3/uL (ref 0–0.1)
Basophils Relative: 1 %
EOS PCT: 2 %
Eosinophils Absolute: 0.1 10*3/uL (ref 0–0.7)
HEMATOCRIT: 39.1 % (ref 35.0–47.0)
Hemoglobin: 13.5 g/dL (ref 12.0–16.0)
LYMPHS ABS: 2.4 10*3/uL (ref 1.0–3.6)
LYMPHS PCT: 32 %
MCH: 29.2 pg (ref 26.0–34.0)
MCHC: 34.4 g/dL (ref 32.0–36.0)
MCV: 84.9 fL (ref 80.0–100.0)
Monocytes Absolute: 0.4 10*3/uL (ref 0.2–0.9)
Monocytes Relative: 6 %
NEUTROS ABS: 4.4 10*3/uL (ref 1.4–6.5)
NEUTROS PCT: 59 %
PLATELETS: 388 10*3/uL (ref 150–440)
RBC: 4.61 MIL/uL (ref 3.80–5.20)
RDW: 12.8 % (ref 11.5–14.5)
WBC: 7.4 10*3/uL (ref 3.6–11.0)

## 2017-07-12 LAB — COMPREHENSIVE METABOLIC PANEL
ALT: 15 U/L (ref 14–54)
AST: 18 U/L (ref 15–41)
Albumin: 3.9 g/dL (ref 3.5–5.0)
Alkaline Phosphatase: 102 U/L (ref 38–126)
Anion gap: 7 (ref 5–15)
BILIRUBIN TOTAL: 0.3 mg/dL (ref 0.3–1.2)
BUN: 12 mg/dL (ref 6–20)
CHLORIDE: 102 mmol/L (ref 101–111)
CO2: 28 mmol/L (ref 22–32)
Calcium: 9 mg/dL (ref 8.9–10.3)
Creatinine, Ser: 0.84 mg/dL (ref 0.44–1.00)
Glucose, Bld: 103 mg/dL — ABNORMAL HIGH (ref 65–99)
POTASSIUM: 4.1 mmol/L (ref 3.5–5.1)
Sodium: 137 mmol/L (ref 135–145)
TOTAL PROTEIN: 7.4 g/dL (ref 6.5–8.1)

## 2017-07-12 NOTE — Progress Notes (Signed)
Left vm for patient- unable to add tsh to patient's labs today per dr. B.

## 2017-07-12 NOTE — Assessment & Plan Note (Addendum)
#  Recurrent DVT/[name] [with acquired risk factors limited mobility-ankle surgery].  Continue Eliquis indefinite.  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   #Chronic fatigue/chronic myalgias-question etiology; unable to add TSH.  Recommend to follow-up with PCP  # follow up in 6 months/labs.

## 2017-07-12 NOTE — Progress Notes (Signed)
[place] NOTE  Patient Care Team: Venia Carbon, [name], 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; Superficial superficial vein- Left Medial LLE- started on Lovenox 59m BID; June 2017- On Eliquis BID  HISTORY OF PRESENTING ILLNESS:  Taylor Frederick 57y.o.  female above history of recurrent DVT; prior history of PE- currently on Eliquis.  Patient complains of chronic fatigue.  Also complains of chronic myalgias.  Continues to complain of chronic swelling in the legs left more than right.  Patient not using compression stockings as recommended.  No falls.  No gum bleeding or bleeding.  Review of Systems  Constitutional: Positive for malaise/fatigue. Negative for chills, diaphoresis, [name], hemoptysis, sputum production, [name], palpitations and orthopnea.  Gastrointestinal: Negative for abdominal pain, blood in stool, constipation, diarrhea, heartburn, melena, nausea and vomiting.  Genitourinary: Negative for dysuria, frequency and urgency.  Musculoskeletal: Positive for joint pain and myalgias. Negative for back pain.  Skin: Negative.  Negative for itching and rash.  Neurological: Negative for dizziness, tingling, focal weakness, weakness and headaches.  Endo/Heme/Allergies:  Does not bruise/bleed easily.  Psychiatric/Behavioral: Negative for depression. The patient is not nervous/anxious and does not have insomnia.        MEDICAL HISTORY:  Past Medical History:  Diagnosis Date  . Asymptomatic varicose veins   . Diffuse cystic mastopathy   . DVT (deep venous thrombosis) ([place] 2003   With BCP's  . Edema of both legs   . Hypothyroidism   . Venous insufficiency     SURGICAL HISTORY: Past Surgical History:  Procedure Laterality Date  . DILATION AND CURETTAGE OF UTERUS  ~2003  . LEG SURGERY  01/2015  . ORIF ANKLE FRACTURE Left 01/31/2015   Procedure: OPEN REDUCTION INTERNAL FIXATION (ORIF) DISTAL TIBIA  FRACTURE;  [name];  [name];  Service: Orthopedics;  Laterality: Left;  . PERIPHERAL VASCULAR CATHETERIZATION N/A 02/24/2015   Procedure: IVC Filter Insertion;  [name];  [facility];  Service: Cardiovascular;  [name];  . PERIPHERAL VASCULAR CATHETERIZATION N/A 06/20/2015   Procedure: IVC Filter Removal;  [name];  [facility];  Service: Cardiovascular;  [name];  . TUBAL LIGATION  2005  . vein closure Left 2008   Left Leg  . Vein Procedure     Dr. SJamal Collin   SOCIAL HISTORY: Social History   Socioeconomic History  . Marital status: Married    Spouse name: Not on file  . Number of children: 1  . Years of education: Not on file  . Highest education level: Not on file  Occupational History  . Occupation: OGlass blower/designer   Comment: BEducational psychologist . Occupation:      Comment:  Social Needs  . Financial resource strain: Not on file  . Food insecurity:    Worry: Not on file    Inability: Not on file  . Transportation needs:    Medical: Not on file    Non-medical: Not on file  Tobacco Use  . Smoking status: Never Smoker  . Smokeless tobacco: Never Used  Substance and Sexual Activity  . Alcohol use: No  . Drug use: No   . Sexual activity: Yes  Lifestyle  . Physical activity:    Days per week: Not on file    Minutes per session: Not on file  . Stress: Not on file  Relationships  . Social connections:    Talks on phone: Not on file    Gets together: Not on file    Attends religious service: Not on file    Active member of club or organization: Not on file    Attends meetings of clubs or organizations: Not on file    Relationship status: Not on file  . Intimate partner violence:    Fear of current or ex partner: Not on file    Emotionally abused: Not on file    Physically abused: Not on file    Forced sexual activity: Not on file  Other Topics Concern  . Not on file  Social History Narrative  . Not on file    FAMILY HISTORY: Family History  Problem Relation Age of Onset  . Heart disease Mother        CHF  . Diabetes Mother   . Hypothyroidism Mother   . Cancer Maternal Aunt        Breast and Ovarian  (Believes BRCA positive)  . Cancer Other        Breast CA (Believes BRCA positive) (mom had br/ovar ca)  . Cancer Maternal Aunt        Melanoma  . Cancer Father        stomach    ALLERGIES:  is allergic to estrogens.  MEDICATIONS:  Current Outpatient Medications  Medication Sig Dispense Refill  . ELIQUIS 5 MG TABS tablet TAKE 1 TABLET TWICE A DAY 180 tablet 1  . levothyroxine (SYNTHROID) 112 MCG tablet Take 1 tablet (112 mcg total) by mouth daily before breakfast. 30 tablet 3  . omeprazole (PRILOSEC) 20 MG capsule Take 1 capsule (20 mg total) by mouth daily as needed. (Patient not taking: Reported on 07/12/2017) 1 capsule 0   No current facility-administered medications for this visit.       Marland Kitchen  PHYSICAL EXAMINATION:   Vitals:   07/12/17 1447  BP: 117/78  Pulse: 78  Resp: 16  Temp: 98.7 F (37.1 C)   Filed Weights   07/12/17 1447  Weight: 185 lb 9.6 oz (84.2 kg)    .GENERAL: Well-nourished well-developed; Alert, no distress and comfortable.  Alone.  EYES: no pallor  or icterus OROPHARYNX: no thrush or ulceration; NECK: supple; no lymph nodes felt. LYMPH:  no palpable lymphadenopathy in the axillary or inguinal regions LUNGS: Decreased breath sounds auscultation bilaterally. No wheeze or crackles HEART/CVS: regular rate & rhythm and no murmurs; No lower extremity edema ABDOMEN:abdomen soft, non-tender and normal bowel sounds. No hepatomegaly or splenomegaly.  Musculoskeletal:no cyanosis of digits and no clubbing  PSYCH: alert & oriented x 3 with fluent speech NEURO: no focal motor/sensory deficits SKIN:  no rashes or significant lesions LABORATORY DATA:  I have reviewed the data as listed Lab Results  Component Value Date  WBC 7.4 07/12/2017   HGB 13.5 07/12/2017   HCT 39.1 07/12/2017   MCV 84.9 07/12/2017   PLT 388 07/12/2017   Recent Labs    01/11/17 1148 07/12/17 1416  NA 135 137  K 4.4 4.1  CL 100* 102  CO2 25 28  GLUCOSE 100* 103*  BUN 11 12  CREATININE 0.84 0.84  CALCIUM 9.4 9.0  GFRNONAA >60 >60  GFRAA >60 >60  PROT  --  7.4  ALBUMIN  --  3.9  AST  --  18  ALT  --  15  ALKPHOS  --  102  BILITOT  --  0.3    RADIOGRAPHIC STUDIES: I have personally reviewed the radiological images as listed and agreed with the findings in the report. No results found.  ASSESSMENT & PLAN:   Leg DVT (deep venous thromboembolism), chronic, left ([facility]) #Recurrent DVT/[name] [with acquired risk factors limited mobility-ankle surgery].  Continue Eliquis indefinite.  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   #Chronic fatigue/chronic myalgias-question etiology; unable to add TSH.  Recommend to follow-up with PCP  # follow up in 6 months/labs.     Cammie Sickle, MD 07/12/2017 5:07 PM

## 2017-10-10 DIAGNOSIS — R05 Cough: Secondary | ICD-10-CM | POA: Diagnosis not present

## 2017-10-10 DIAGNOSIS — J069 Acute upper respiratory infection, unspecified: Secondary | ICD-10-CM | POA: Diagnosis not present

## 2017-10-11 ENCOUNTER — Other Ambulatory Visit: Payer: Self-pay | Admitting: Internal Medicine

## 2017-10-11 DIAGNOSIS — I82401 Acute embolism and thrombosis of unspecified deep veins of right lower extremity: Secondary | ICD-10-CM

## 2017-10-14 ENCOUNTER — Other Ambulatory Visit: Payer: Self-pay | Admitting: *Deleted

## 2017-10-14 DIAGNOSIS — I82401 Acute embolism and thrombosis of unspecified deep veins of right lower extremity: Secondary | ICD-10-CM

## 2017-10-14 MED ORDER — APIXABAN 5 MG PO TABS: 5.0000 mg | ORAL_TABLET | Freq: Two times a day (BID) | ORAL | 1 refills | Status: DC

## 2017-10-15 ENCOUNTER — Other Ambulatory Visit: Payer: Self-pay | Admitting: *Deleted

## 2017-10-15 DIAGNOSIS — I82401 Acute embolism and thrombosis of unspecified deep veins of right lower extremity: Secondary | ICD-10-CM

## 2017-10-15 MED ORDER — APIXABAN 5 MG PO TABS: 5.0000 mg | ORAL_TABLET | Freq: Two times a day (BID) | ORAL | 1 refills | Status: DC

## 2017-10-15 NOTE — Telephone Encounter (Signed)
Patient called stating that she needs her Eliquis through mail order, not local pharmacy. [facility]

## 2017-11-10 IMAGING — US US EXTREM LOW VENOUS BILAT
1 series · 13 of 24 positions shown · non-contrast
Comparison: Left lower extreme venous Doppler ultrasound -
07/25/2015

CLINICAL DATA: Bilateral lower extremity pain and edema for the
past year. History of DVT and pulmonary embolism. Evaluate for acute
or chronic DVT.



[Series 1: us extrem low venous bilat · 0.08mm/px · 13 of 61 slices shown]
[im 1/61]
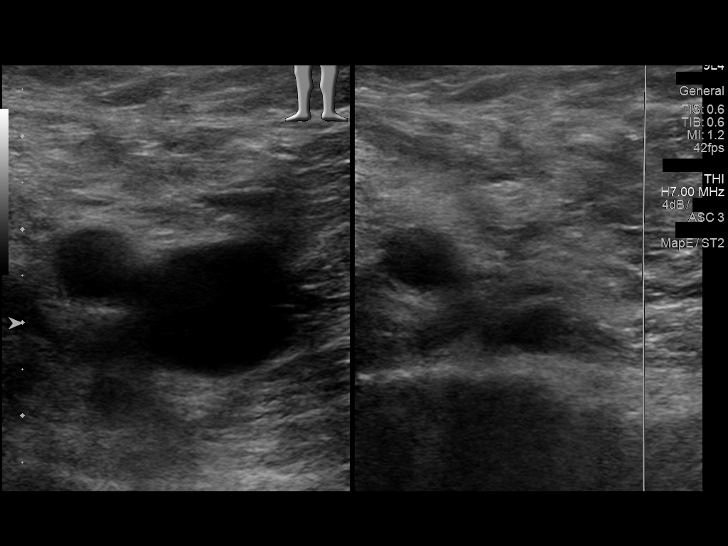
[im 6/61]
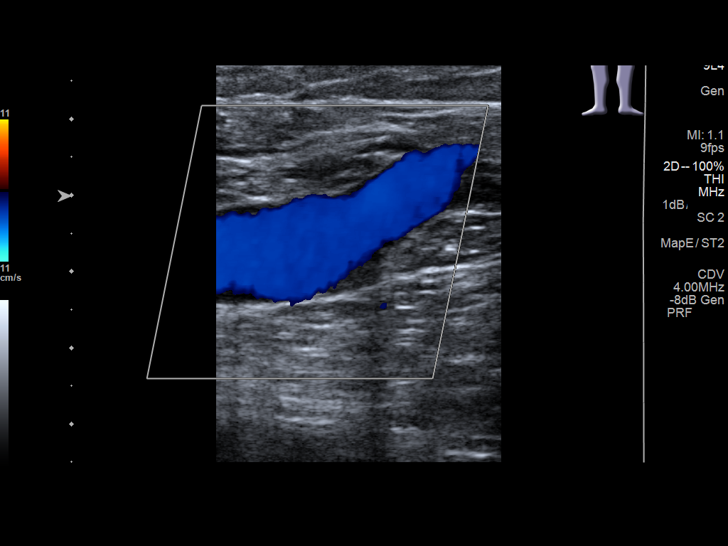
[im 11/61]
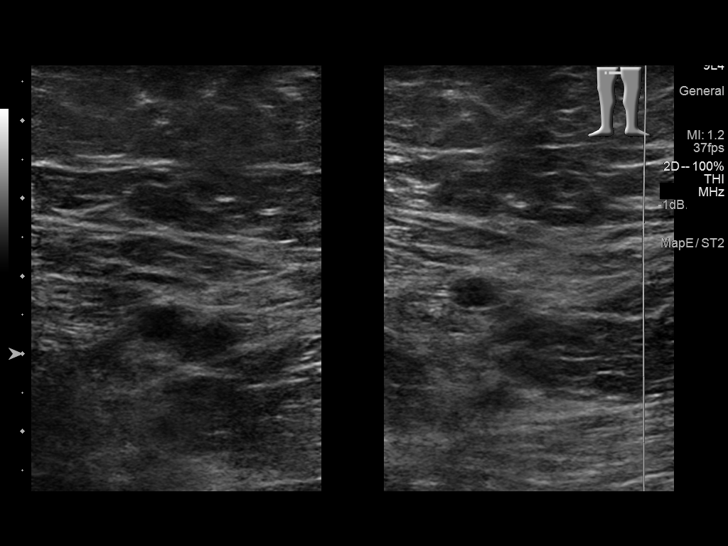
[im 16/61]
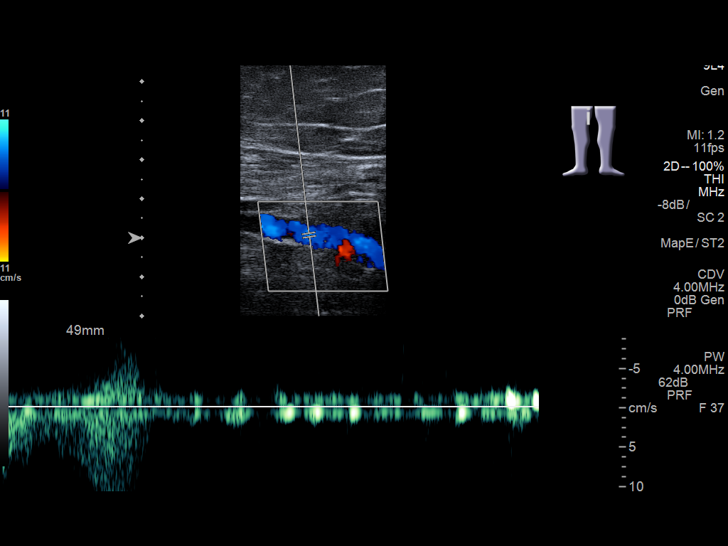
[im 21/61]
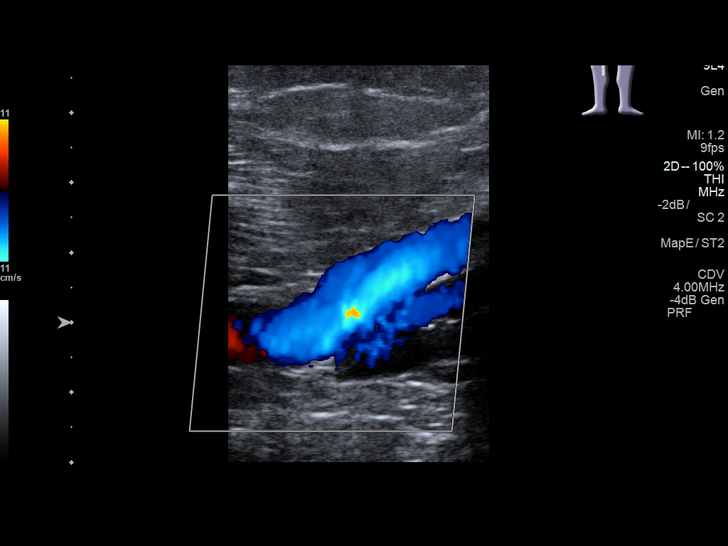
[im 27/61]
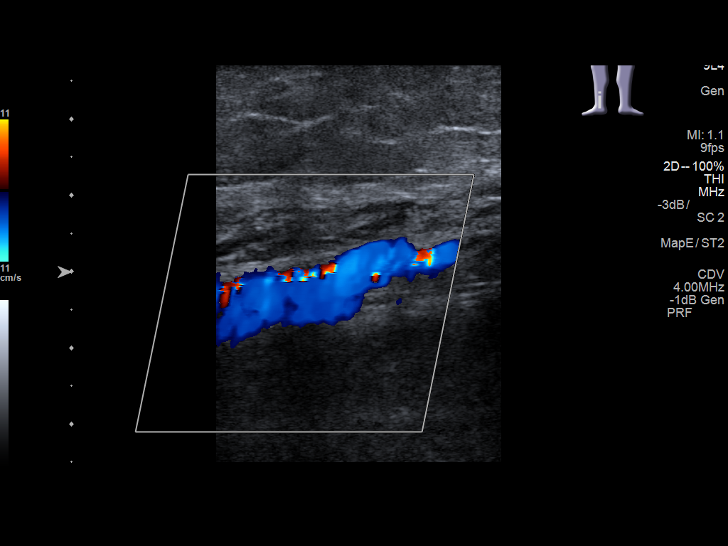
[im 32/61]
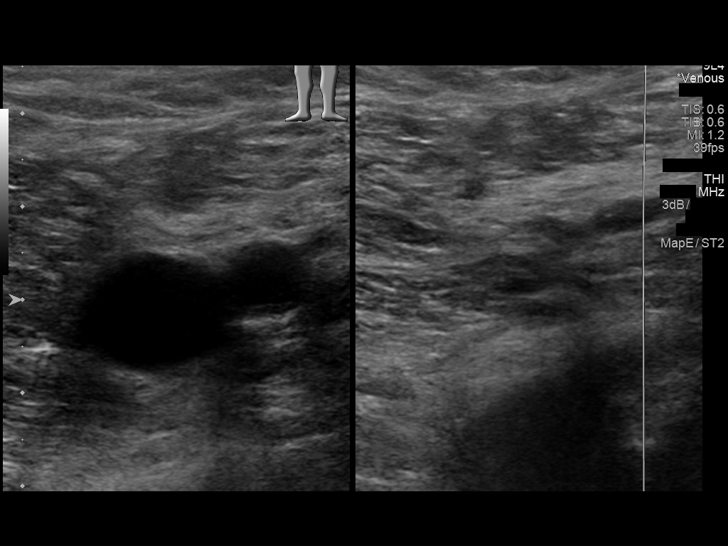
[im 34/61]
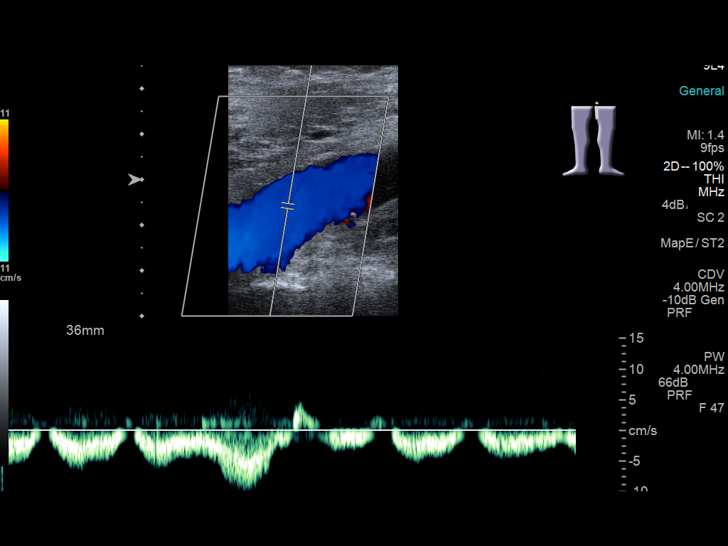
[im 40/61]
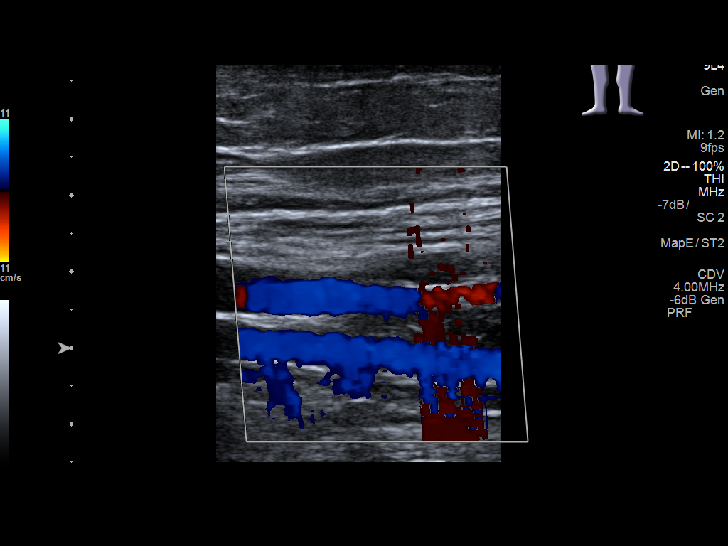
[im 45/61]
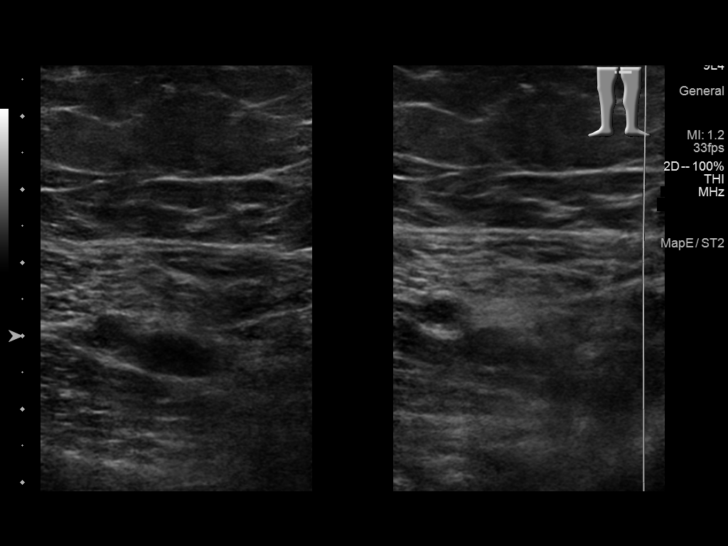
[im 50/61]
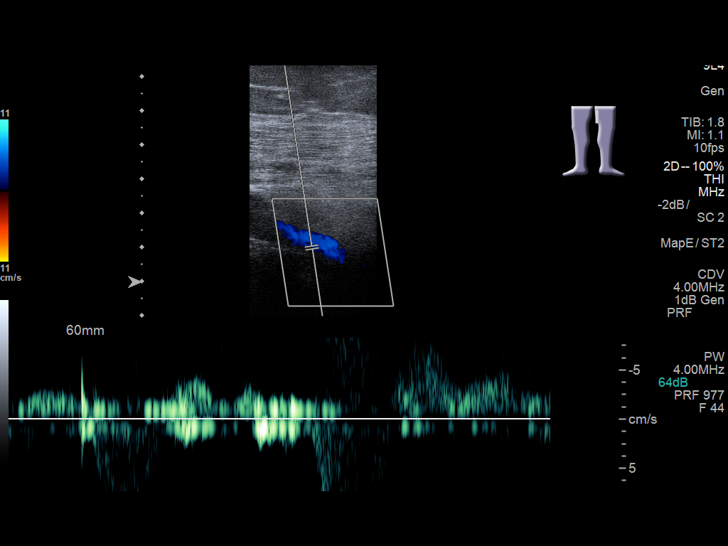
[im 55/61]
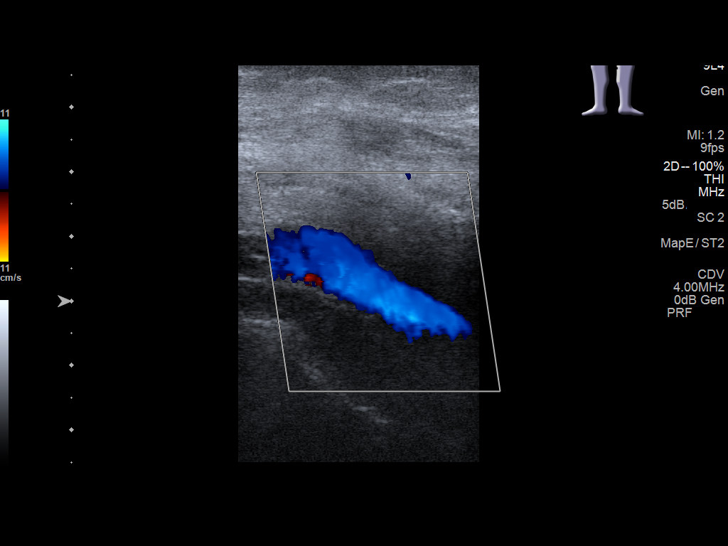
[im 61/61]
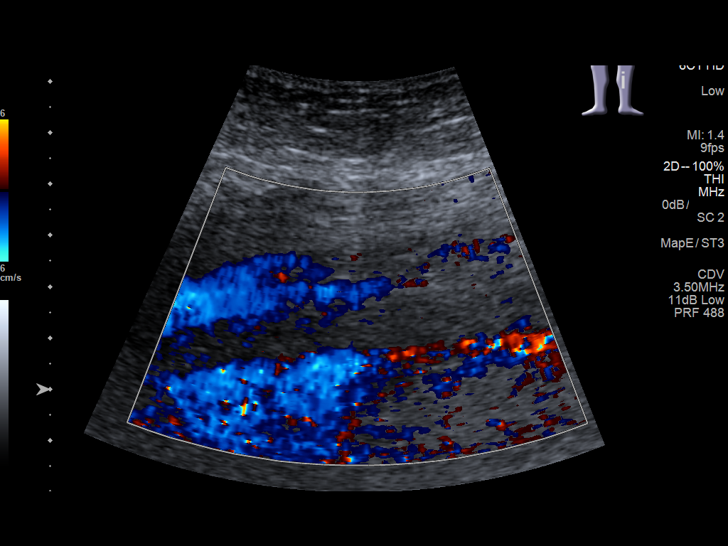

[13 of 24 positions shown; findings below may reference images not displayed]

FINDINGS: RIGHT LOWER EXTREMITY

Common Femoral Vein: No evidence of thrombus. Normal
compressibility, respiratory phasicity and response to augmentation.

Saphenofemoral Junction: No evidence of thrombus. Normal
compressibility and flow on color Doppler imaging.

Profunda Femoral Vein: No evidence of thrombus. Normal
compressibility and flow on color Doppler imaging.

Femoral Vein: No evidence of thrombus. Normal compressibility,
respiratory phasicity and response to augmentation.

Popliteal Vein: No evidence of thrombus. Normal compressibility,
respiratory phasicity and response to augmentation.

Calf Veins: No evidence of thrombus. Normal compressibility and flow
on color Doppler imaging.

Superficial Great Saphenous Vein: No evidence of thrombus. Normal
compressibility.

Venous Reflux:  None.

Other Findings:  None.

LEFT LOWER EXTREMITY

Common Femoral Vein: No evidence of thrombus. Normal
compressibility, respiratory phasicity and response to augmentation.

Saphenofemoral Junction: No evidence of thrombus. Normal
compressibility and flow on color Doppler imaging.

Profunda Femoral Vein: No evidence of thrombus. Normal
compressibility and flow on color Doppler imaging.

Femoral Vein: No evidence of thrombus. Normal compressibility,
respiratory phasicity and response to augmentation.

Popliteal Vein: No evidence of acute or chronic thrombus. Normal
compressibility, respiratory phasicity and response to augmentation.

Calf Veins: No evidence of thrombus. Normal compressibility and flow
on color Doppler imaging.

Superficial Great Saphenous Vein: No evidence of thrombus. Normal
compressibility.

Venous Reflux:  None.

Other Findings:  None.
IMPRESSION: No evidence acute or chronic DVT within either lower extremity with
special attention paid to the left popliteal vein.

## 2017-11-25 ENCOUNTER — Encounter: Payer: Self-pay | Admitting: Obstetrics and Gynecology

## 2017-11-25 DIAGNOSIS — Z1231 Encounter for screening mammogram for malignant neoplasm of breast: Secondary | ICD-10-CM | POA: Diagnosis not present

## 2017-12-12 ENCOUNTER — Encounter: Payer: Self-pay | Admitting: Obstetrics and Gynecology

## 2017-12-12 ENCOUNTER — Ambulatory Visit (INDEPENDENT_AMBULATORY_CARE_PROVIDER_SITE_OTHER): Payer: BLUE CROSS/BLUE SHIELD | Admitting: Obstetrics and Gynecology

## 2017-12-12 VITALS — BP 122/70 | HR 71 | Ht 63.0 in | Wt 184.0 lb

## 2017-12-12 DIAGNOSIS — N951 Menopausal and female climacteric states: Secondary | ICD-10-CM

## 2017-12-12 DIAGNOSIS — Z1211 Encounter for screening for malignant neoplasm of colon: Secondary | ICD-10-CM

## 2017-12-12 DIAGNOSIS — Z803 Family history of malignant neoplasm of breast: Secondary | ICD-10-CM | POA: Diagnosis not present

## 2017-12-12 DIAGNOSIS — Z1239 Encounter for other screening for malignant neoplasm of breast: Secondary | ICD-10-CM | POA: Diagnosis not present

## 2017-12-12 DIAGNOSIS — Z01411 Encounter for gynecological examination (general) (routine) with abnormal findings: Secondary | ICD-10-CM

## 2017-12-12 DIAGNOSIS — Z131 Encounter for screening for diabetes mellitus: Secondary | ICD-10-CM | POA: Diagnosis not present

## 2017-12-12 DIAGNOSIS — E039 Hypothyroidism, unspecified: Secondary | ICD-10-CM

## 2017-12-12 DIAGNOSIS — Z01419 Encounter for gynecological examination (general) (routine) without abnormal findings: Secondary | ICD-10-CM

## 2017-12-12 DIAGNOSIS — Z0000 Encounter for general adult medical examination without abnormal findings: Secondary | ICD-10-CM

## 2017-12-12 NOTE — Progress Notes (Signed)
PCP: Venia Carbon, MD   Chief Complaint  Patient presents with  . Gynecologic Exam    HPI:      [name] is a 57 y.o. G1P1 who LMP was No LMP recorded. (Menstrual status: Perimenopausal)., presents today for her annual examination.  Her menses are absent due to menopause. She does not have intermenstrual bleeding.  Sex activity: single partner, contraception - post menopausal status. She does have vaginal dryness and sex is impossible. She has vag areas that are painful and itchy at times, and then resolve. They really flare with sex as well. She has tried coconut oil without much relief. SHE CAN'T HAVE ESTROGENS DUE TO HX OF 2 DVTs. Pt is on blood thinners and thinks she is Factor V Leiden positive.   Last Pap: Jul 21, 2015  Results were: no abnormalities /neg HPV DNA.  Hx of STDs: none  Last mammogram: within last 2 wks, sent to PCP.   Results were: normal--routine follow-up in 12 months. Had been followed by Dr. Jamal Collin.  There is a FH of breast cancer in her mat aunt, who also had ovar ca, and a mat cousin. She has a 2nd aunt with melanoma. Pt's 2nd cousin did genetic testing and she thinks she was "positive". Pt has never had testing done/declined last yr. The patient does do self-breast exams.  Colonoscopy: never; scared of procedure.  Tobacco use: The patient denies current or previous tobacco use. Alcohol use: none Exercise: mod active  She does get adequate calcium and Vitamin D in her diet.  Has hypothyroidism and is followed by hematologist with labs/Rx. Hasn't had recent labs. Doesn't see PCP regularly. Wants labs today.   Past Medical History:  Diagnosis Date  . Asymptomatic varicose veins   . Diffuse cystic mastopathy   . DVT (deep venous thrombosis) ([place]) 2003   With BCP's  . Edema of both legs   . Hypothyroidism   . Venous insufficiency     Past Surgical History:  Procedure Laterality Date  . DILATION AND CURETTAGE OF UTERUS  ~2003  .  LEG SURGERY  01/2015  . ORIF ANKLE FRACTURE Left 01/31/2015   Procedure: OPEN REDUCTION INTERNAL FIXATION (ORIF) DISTAL TIBIA  FRACTURE;  Surgeon: Corky Mull, MD;  [name];  Service: Orthopedics;  Laterality: Left;  . PERIPHERAL VASCULAR CATHETERIZATION N/A 02/24/2015   Procedure: IVC Filter Insertion;  Surgeon: Algernon Huxley, MD;  Location: [facility];  Service: Cardiovascular;  [name];  . PERIPHERAL VASCULAR CATHETERIZATION N/A 06/20/2015   Procedure: IVC Filter Removal;  Surgeon: Algernon Huxley, MD;  Location: [facility];  Service: Cardiovascular;  [name];  . TUBAL LIGATION  2005  . vein closure Left 2008   Left Leg  . Vein Procedure     Dr. Jamal Collin    Family History  Problem Relation Age of Onset  . Heart disease Mother        CHF  . Diabetes Mother   . Hypothyroidism Mother   . Cancer Maternal Aunt        Breast and Ovarian  (Believes BRCA positive)  . Cancer Other        Breast CA (Believes BRCA positive) (mom had br/ovar ca)  . Cancer Maternal Aunt        Melanoma  . Cancer Father        stomach    Social History   Socioeconomic History  . Marital status: Married  Spouse name: Not on file  . Number of children: 1  . Years of education: Not on file  . Highest education level: Not on file  Occupational History  . Occupation: Glass blower/designer    Comment: Educational psychologist  . Occupation:      Comment:    Social Needs  . Financial resource strain: Not on file  . Food insecurity:    Worry: Not on file    Inability: Not on file  . Transportation needs:    Medical: Not on file    Non-medical: Not on file  Tobacco Use  . Smoking status: Never Smoker  . Smokeless tobacco: Never Used  Substance and Sexual Activity  . Alcohol use: No  . Drug use: No  . Sexual activity: Yes  Lifestyle  . Physical activity:    Days per week: Not on file    Minutes per session: Not on file  . Stress: Not on file  Relationships  .  Social connections:    Talks on phone: Not on file    Gets together: Not on file    Attends religious service: Not on file    Active member of club or organization: Not on file    Attends meetings of clubs or organizations: Not on file    Relationship status: Not on file  . Intimate partner violence:    Fear of current or ex partner: Not on file    Emotionally abused: Not on file    Physically abused: Not on file    Forced sexual activity: Not on file  Other Topics Concern  . Not on file  Social History Narrative  . Not on file    Current Meds  Medication Sig  . apixaban (ELIQUIS) 5 MG TABS tablet Take 1 tablet (5 mg total) by mouth 2 (two) times daily.  Marland Kitchen levothyroxine (SYNTHROID) 112 MCG tablet Take 1 tablet (112 mcg total) by mouth daily before breakfast.      ROS:  Review of Systems  Constitutional: Negative for fatigue, fever and unexpected weight change.  Respiratory: Negative for cough, shortness of breath and wheezing.   Cardiovascular: Negative for chest pain, palpitations and leg swelling.  Gastrointestinal: Negative for blood in stool, constipation, diarrhea, nausea and vomiting.  Endocrine: Negative for cold intolerance, heat intolerance and polyuria.  Genitourinary: Positive for dyspareunia and vaginal pain. Negative for dysuria, flank pain, frequency, genital sores, hematuria, menstrual problem, pelvic pain, urgency, vaginal bleeding and vaginal discharge.  Musculoskeletal: Negative for back pain, joint swelling and myalgias.  Skin: Negative for rash.  Neurological: Negative for dizziness, syncope, light-headedness, numbness and headaches.  Hematological: Negative for adenopathy.  Psychiatric/Behavioral: Negative for agitation, confusion, sleep disturbance and suicidal ideas. The patient is not nervous/anxious.      Objective: BP 122/70   Pulse 71   Ht 5' 3"  (1.6 m)   Wt 184 lb (83.5 kg)   BMI 32.59 kg/m    Physical Exam  Constitutional: She is  oriented to person, place, and time. She appears well-developed and well-nourished.  Genitourinary: Vagina normal and uterus normal. There is no rash or tenderness on the right labia.  There is lesion on the left labia. There is no rash or tenderness on the left labia.    No erythema or tenderness in the vagina. No vaginal discharge found. Right adnexum does not display mass and does not display tenderness. Left adnexum does not display mass and does not display tenderness. Cervix does not exhibit motion tenderness  or polyp. Uterus is not enlarged or tender. Rectal exam shows no fissure, no tenderness and guaiac negative stool.  Genitourinary Comments: EXT VAGINAL ATROPHY/PALE TISSUE  Neck: Normal range of motion. No thyromegaly present.  Cardiovascular: Normal rate, regular rhythm and normal heart sounds.  No murmur heard. Pulmonary/Chest: Effort normal and breath sounds normal. Right breast exhibits no mass, no nipple discharge, no skin change and no tenderness. Left breast exhibits no mass, no nipple discharge, no skin change and no tenderness.  Abdominal: Soft. There is no tenderness. There is no guarding.  Musculoskeletal: Normal range of motion.  Neurological: She is alert and oriented to person, place, and time. No cranial nerve deficit.  Psychiatric: She has a normal mood and affect. Her behavior is normal.  Vitals reviewed.   Assessment/Plan:  Encounter for annual routine gynecological examination  Screening for breast cancer - Pt current on mammo. - Plan: MM 3D SCREEN BREAST BILATERAL  Family history of breast cancer - MyRisk testing discussed again. Pt to consider and f/u for labs prn.  Blood tests for routine general physical examination - Plan: TSH + free T4, Hemoglobin A1c, Comprehensive metabolic panel  Screening for diabetes mellitus - Plan: Hemoglobin A1c  Hypothyroidism, unspecified type - Will call with results. Rx to be managed by her MD - Plan: TSH + free  T4  Screening for colon cancer - Colonoscopy and Cologuard discussed and encouraged. Pt to f/u if desires.   Vaginal dryness, menopausal - Cont coconut oil.  Pt to ask hematology if she can do vag ERT since no systemic absorption.           GYN counsel breast self exam, mammography screening, menopause, adequate intake of calcium and vitamin D, diet and exercise    F/U  Return in about 1 year (around 12/13/2018).  Katira Dumais B. Talulah Schirmer, PA-C 12/12/2017 9:36 AM

## 2017-12-12 NOTE — Patient Instructions (Signed)
I value your feedback and entrusting us with your care. If you get a [facility] patient survey, I would appreciate you taking the time to let us know about your experience today. Thank you!  [facility]: [phone]  [facility]: [phone]  

## 2017-12-13 LAB — HEMOGLOBIN A1C
Est. average glucose Bld gHb Est-mCnc: 120 mg/dL
Hgb A1c MFr Bld: 5.8 % — ABNORMAL HIGH (ref 4.8–5.6)

## 2017-12-13 LAB — COMPREHENSIVE METABOLIC PANEL
ALBUMIN: 4.1 g/dL (ref 3.5–5.5)
ALK PHOS: 106 IU/L (ref 39–117)
ALT: 17 IU/L (ref 0–32)
AST: 17 IU/L (ref 0–40)
Albumin/Globulin Ratio: 1.4 (ref 1.2–2.2)
BUN / CREAT RATIO: 9 (ref 9–23)
BUN: 7 mg/dL (ref 6–24)
Bilirubin Total: 0.2 mg/dL (ref 0.0–1.2)
CALCIUM: 9.4 mg/dL (ref 8.7–10.2)
CO2: 24 mmol/L (ref 20–29)
CREATININE: 0.77 mg/dL (ref 0.57–1.00)
Chloride: 100 mmol/L (ref 96–106)
GFR, EST AFRICAN AMERICAN: 101 mL/min/{1.73_m2} (ref >59)
GFR, EST NON AFRICAN AMERICAN: 87 mL/min/{1.73_m2} (ref >59)
GLOBULIN, TOTAL: 2.9 g/dL (ref 1.5–4.5)
GLUCOSE: 76 mg/dL (ref 65–99)
POTASSIUM: 4.3 mmol/L (ref 3.5–5.2)
SODIUM: 141 mmol/L (ref 134–144)
TOTAL PROTEIN: 7 g/dL (ref 6.0–8.5)

## 2017-12-13 LAB — TSH+FREE T4
Free T4: 1.57 ng/dL (ref 0.82–1.77)
TSH: 0.475 u[IU]/mL (ref 0.450–4.500)

## 2017-12-16 NOTE — Progress Notes (Signed)
Pt aware.

## 2017-12-16 NOTE — Progress Notes (Signed)
Pls let pt know thyroid labs and CMP normal. Has mild pre-diabetes. Diet/exercise/wt loss to improve it. No need for Rx meds.

## 2018-01-13 ENCOUNTER — Inpatient Hospital Stay (HOSPITAL_BASED_OUTPATIENT_CLINIC_OR_DEPARTMENT_OTHER): Payer: BLUE CROSS/BLUE SHIELD | Admitting: Internal Medicine

## 2018-01-13 ENCOUNTER — Inpatient Hospital Stay: Payer: BLUE CROSS/BLUE SHIELD | Attending: Internal Medicine

## 2018-01-13 ENCOUNTER — Encounter: Payer: Self-pay | Admitting: Internal Medicine

## 2018-01-13 VITALS — BP 126/75 | HR 69 | Temp 98.1°F | Resp 16

## 2018-01-13 DIAGNOSIS — R5382 Chronic fatigue, unspecified: Secondary | ICD-10-CM

## 2018-01-13 DIAGNOSIS — Z86711 Personal history of pulmonary embolism: Secondary | ICD-10-CM

## 2018-01-13 DIAGNOSIS — I82502 Chronic embolism and thrombosis of unspecified deep veins of left lower extremity: Secondary | ICD-10-CM

## 2018-01-13 DIAGNOSIS — Z7901 Long term (current) use of anticoagulants: Secondary | ICD-10-CM | POA: Insufficient documentation

## 2018-01-13 DIAGNOSIS — M7989 Other specified soft tissue disorders: Secondary | ICD-10-CM | POA: Diagnosis not present

## 2018-01-13 DIAGNOSIS — M791 Myalgia, unspecified site: Secondary | ICD-10-CM | POA: Diagnosis not present

## 2018-01-13 LAB — CBC WITH DIFFERENTIAL/PLATELET
ABS IMMATURE GRANULOCYTES: 0.01 10*3/uL (ref 0.00–0.07)
Basophils Absolute: 0.1 10*3/uL (ref 0.0–0.1)
Basophils Relative: 1 %
EOS ABS: 0.1 10*3/uL (ref 0.0–0.5)
Eosinophils Relative: 2 %
HCT: 41.1 % (ref 36.0–46.0)
Hemoglobin: 13.4 g/dL (ref 12.0–15.0)
IMMATURE GRANULOCYTES: 0 %
Lymphocytes Relative: 36 %
Lymphs Abs: 2.2 10*3/uL (ref 0.7–4.0)
MCH: 28.2 pg (ref 26.0–34.0)
MCHC: 32.6 g/dL (ref 30.0–36.0)
MCV: 86.3 fL (ref 80.0–100.0)
Monocytes Absolute: 0.4 10*3/uL (ref 0.1–1.0)
Monocytes Relative: 6 %
NEUTROS PCT: 55 %
Neutro Abs: 3.4 10*3/uL (ref 1.7–7.7)
Platelets: 361 10*3/uL (ref 150–400)
RBC: 4.76 MIL/uL (ref 3.87–5.11)
RDW: 12.4 % (ref 11.5–15.5)
WBC: 6.2 10*3/uL (ref 4.0–10.5)
nRBC: 0 % (ref 0.0–0.2)

## 2018-01-13 LAB — COMPREHENSIVE METABOLIC PANEL
ALBUMIN: 4 g/dL (ref 3.5–5.0)
ALK PHOS: 96 U/L (ref 38–126)
ALT: 16 U/L (ref 0–44)
ANION GAP: 10 (ref 5–15)
AST: 17 U/L (ref 15–41)
BILIRUBIN TOTAL: 0.3 mg/dL (ref 0.3–1.2)
BUN: 14 mg/dL (ref 6–20)
CALCIUM: 9.2 mg/dL (ref 8.9–10.3)
CO2: 27 mmol/L (ref 22–32)
Chloride: 102 mmol/L (ref 98–111)
Creatinine, Ser: 0.85 mg/dL (ref 0.44–1.00)
GFR calc Af Amer: >60 mL/min (ref >60)
GLUCOSE: 122 mg/dL — AB (ref 70–99)
POTASSIUM: 4 mmol/L (ref 3.5–5.1)
Sodium: 139 mmol/L (ref 135–145)
Total Protein: 7.5 g/dL (ref 6.5–8.1)

## 2018-01-13 NOTE — Progress Notes (Signed)
[facility] NOTE  Patient Care Team: Venia Carbon, [name], 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; Superficial superficial vein- Left Medial LLE- started on Lovenox 25m BID; June 2017- On Eliquis BID  HISTORY OF PRESENTING ILLNESS:  RMedia PizziniStrange 57y.o.  female above history of recurrent DVT; prior history of PE- currently on Eliquis.  Patient continues to have intermittent swelling in the legs.  Denies any worsening leg swelling.  Intermittently using compression stockings.  Patient has not had any new blood clots.  Review of Systems  Constitutional: Positive for malaise/fatigue. Negative for chills, diaphoresis, [name], hemoptysis, sputum production, [name], palpitations and orthopnea.  Gastrointestinal: Negative for abdominal pain, blood in stool, constipation, diarrhea, heartburn, melena, nausea and vomiting.  Genitourinary: Negative for dysuria, frequency and urgency.  Musculoskeletal: Negative for back pain.  Skin: Negative.  Negative for itching and rash.  Neurological: Negative for dizziness, tingling, focal weakness, weakness and headaches.  Endo/Heme/Allergies: Does not bruise/bleed easily.  Psychiatric/Behavioral: Negative for depression. The patient is not  nervous/anxious and does not have insomnia.        MEDICAL HISTORY:  Past Medical History:  Diagnosis Date  . Asymptomatic varicose veins   . Diffuse cystic mastopathy   . DVT (deep venous thrombosis) ([place] 2003   With BCP's  . Edema of both legs   . Hypothyroidism   . Venous insufficiency     SURGICAL HISTORY: Past Surgical History:  Procedure Laterality Date  . DILATION AND CURETTAGE OF UTERUS  ~2003  . LEG SURGERY  01/2015  . ORIF ANKLE FRACTURE Left 01/31/2015   Procedure: OPEN REDUCTION INTERNAL FIXATION (ORIF) DISTAL TIBIA  FRACTURE;  [name];  [name];  Service: Orthopedics;  Laterality: Left;  . PERIPHERAL VASCULAR CATHETERIZATION N/A 02/24/2015   Procedure: IVC Filter Insertion;  [name];  [facility];  Service: Cardiovascular;  [name];  . PERIPHERAL VASCULAR CATHETERIZATION N/A 06/20/2015   Procedure: IVC Filter Removal;  [name];  [facility];  Service: Cardiovascular;  [name];  . TUBAL LIGATION  2005  . vein closure Left 2008   Left Leg  . Vein Procedure     Dr. SJamal Collin   SOCIAL HISTORY: Social History   Socioeconomic History  . Marital status: Married    Spouse name: Not on file  . Number of children: 1  . Years of education: Not on file  . Highest education level: Not on file  Occupational History  . Occupation: OGlass blower/designer   Comment: BEducational psychologist . Occupation:      Comment:    Social Needs  . Financial resource strain: Not on file  . Food insecurity:  Worry: Not on file    Inability: Not on file  . Transportation needs:    Medical: Not on file    Non-medical: Not on file  Tobacco Use  . Smoking status: Never Smoker  . Smokeless tobacco: Never Used  Substance and Sexual Activity  . Alcohol use: No  . Drug use: No  . Sexual activity: Yes  Lifestyle  . Physical activity:    Days per week: Not on file     Minutes per session: Not on file  . Stress: Not on file  Relationships  . Social connections:    Talks on phone: Not on file    Gets together: Not on file    Attends religious service: Not on file    Active member of club or organization: Not on file    Attends meetings of clubs or organizations: Not on file    Relationship status: Not on file  . Intimate partner violence:    Fear of current or ex partner: Not on file    Emotionally abused: Not on file    Physically abused: Not on file    Forced sexual activity: Not on file  Other Topics Concern  . Not on file  Social History Narrative  . Not on file    FAMILY HISTORY: Family History  Problem Relation Age of Onset  . Heart disease Mother        CHF  . Diabetes Mother   . Hypothyroidism Mother   . Cancer Maternal Aunt        Breast and Ovarian  (Believes BRCA positive)  . Cancer Other        Breast CA (Believes BRCA positive) (mom had br/ovar ca)  . Cancer Maternal Aunt        Melanoma  . Cancer Father        stomach    ALLERGIES:  is allergic to estrogens.  MEDICATIONS:  Current Outpatient Medications  Medication Sig Dispense Refill  . apixaban (ELIQUIS) 5 MG TABS tablet Take 1 tablet (5 mg total) by mouth 2 (two) times daily. 180 tablet 1  . levothyroxine (SYNTHROID) 112 MCG tablet Take 1 tablet (112 mcg total) by mouth daily before breakfast. 30 tablet 3   No current facility-administered medications for this visit.       Marland Kitchen  PHYSICAL EXAMINATION:   Vitals:   01/13/18 1344  BP: 126/75  Pulse: 69  Resp: 16  Temp: 98.1 F (36.7 C)   There were no vitals filed for this visit.  Physical Exam  Constitutional: She is oriented to person, place, and time and well-developed, well-nourished, and in no distress.  HENT:  Head: Normocephalic and atraumatic.  Mouth/Throat: Oropharynx is clear and moist. No oropharyngeal exudate.  Eyes: Pupils are equal, round, and reactive to light.  Neck: Normal range of  motion. Neck supple.  Cardiovascular: Normal rate and regular rhythm.  Pulmonary/Chest: No respiratory distress. She has no wheezes.  Abdominal: Soft. Bowel sounds are normal. She exhibits no distension and no mass. There is no tenderness. There is no rebound and no guarding.  Musculoskeletal: Normal range of motion. She exhibits no edema or tenderness.  Neurological: She is alert and oriented to person, place, and time.  Skin: Skin is warm.  Psychiatric: Affect normal.    LABORATORY DATA:  I have reviewed the data as listed Lab Results  Component Value Date   WBC 6.2 01/13/2018   HGB 13.4 01/13/2018   HCT 41.1 01/13/2018  MCV 86.3 01/13/2018   PLT 361 01/13/2018   Recent Labs    07/12/17 1416 12/12/17 0843 01/13/18 1321  NA 137 141 139  K 4.1 4.3 4.0  CL 102 100 102  CO2 28 24 27   GLUCOSE 103* 76 122*  BUN 12 7 14   CREATININE 0.84 0.77 0.85  CALCIUM 9.0 9.4 9.2  GFRNONAA >60 87 >60  GFRAA >60 101 >60  PROT 7.4 7.0 7.5  ALBUMIN 3.9 4.1 4.0  AST 18 17 17   ALT 15 17 16   ALKPHOS 102 106 96  BILITOT 0.3 0.2 0.3    RADIOGRAPHIC STUDIES: I have personally reviewed the radiological images as listed and agreed with the findings in the report. No results found.  ASSESSMENT & PLAN:   Leg DVT (deep venous thromboembolism), chronic, left (HCC) #Recurrent DVT/[name] [with acquired risk factors limited mobility-ankle surgery].  Continue Eliquis indefinite.  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  Stable   #Chronic fatigue/chronic myalgias-question etiology; stable.  # DISPOSITION: # follow up in 6 months- MD/labs- cbc/cmp-Dr.B     Cammie Sickle, MD 01/13/2018 5:10 PM

## 2018-01-13 NOTE — Assessment & Plan Note (Addendum)
#  Recurrent DVT/[name] [with acquired risk factors limited mobility-ankle surgery].  Continue Eliquis indefinite.  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  Stable   #Chronic fatigue/chronic myalgias-question etiology; stable.  # DISPOSITION: # follow up in 6 months- MD/labs- cbc/cmp-Dr.B

## 2018-01-27 DIAGNOSIS — J019 Acute sinusitis, unspecified: Secondary | ICD-10-CM | POA: Diagnosis not present

## 2018-01-27 DIAGNOSIS — J40 Bronchitis, not specified as acute or chronic: Secondary | ICD-10-CM | POA: Diagnosis not present

## 2018-01-27 DIAGNOSIS — H109 Unspecified conjunctivitis: Secondary | ICD-10-CM | POA: Diagnosis not present

## 2018-01-27 DIAGNOSIS — B9689 Other specified bacterial agents as the cause of diseases classified elsewhere: Secondary | ICD-10-CM | POA: Diagnosis not present

## 2018-03-04 ENCOUNTER — Other Ambulatory Visit: Payer: Self-pay | Admitting: *Deleted

## 2018-03-04 DIAGNOSIS — I82401 Acute embolism and thrombosis of unspecified deep veins of right lower extremity: Secondary | ICD-10-CM

## 2018-03-04 MED ORDER — APIXABAN 5 MG PO TABS: 5.0000 mg | ORAL_TABLET | Freq: Two times a day (BID) | ORAL | 0 refills | Status: DC

## 2018-05-09 ENCOUNTER — Telehealth: Payer: Self-pay

## 2018-05-09 NOTE — Telephone Encounter (Signed)
Pt has been out of synthroid for 1 1/2 wks.  Will [facility] call it in for her as the provider who last rx'd it is retired.  States [facility] did blood work not too long ago.  [phone]

## 2018-05-09 NOTE — Telephone Encounter (Signed)
Pt called with question regarding rx.  [phone]  Pt wanted to know if [facility] ever rx'd her synthroid.  Adv she has not.

## 2018-05-10 ENCOUNTER — Other Ambulatory Visit: Payer: Self-pay | Admitting: Obstetrics and Gynecology

## 2018-05-10 DIAGNOSIS — E039 Hypothyroidism, unspecified: Secondary | ICD-10-CM

## 2018-05-10 MED ORDER — LEVOTHYROXINE SODIUM 112 MCG PO TABS: 112.0000 ug | ORAL_TABLET | Freq: Every day | ORAL | 1 refills | Status: DC

## 2018-05-10 NOTE — Progress Notes (Signed)
Rx RF levo for 2 months. TSH rechk due 5/20.

## 2018-05-10 NOTE — Telephone Encounter (Signed)
Rx levo eRxd to [facility] since needs quickly. TSH chck due 5/20. Orders in system

## 2018-05-12 NOTE — Telephone Encounter (Signed)
Pt aware and transferred to Taylor Frederick to schedule appt. 

## 2018-05-13 ENCOUNTER — Other Ambulatory Visit: Payer: Self-pay | Admitting: Obstetrics and Gynecology

## 2018-05-13 ENCOUNTER — Telehealth: Payer: Self-pay

## 2018-05-13 DIAGNOSIS — E039 Hypothyroidism, unspecified: Secondary | ICD-10-CM

## 2018-05-13 MED ORDER — SYNTHROID 112 MCG PO TABS: 112.0000 ug | ORAL_TABLET | Freq: Every day | ORAL | 1 refills | Status: DC

## 2018-05-13 NOTE — Progress Notes (Signed)
Rx needs brand only synthroid. Rx RF resent requesting this.

## 2018-05-13 NOTE — Telephone Encounter (Signed)
Pt called triage stating she received a Rx from [facility] on the Synthroid. She picked up the medication and realized it is the generic. She only takes the name brand. Please call in the name brand to pharmacy.

## 2018-05-13 NOTE — Telephone Encounter (Signed)
Rx sent in. Not sure if she can return other one.

## 2018-05-13 NOTE — Telephone Encounter (Signed)
Pt aware.

## 2018-07-01 ENCOUNTER — Other Ambulatory Visit: Payer: Self-pay | Admitting: *Deleted

## 2018-07-01 DIAGNOSIS — I82401 Acute embolism and thrombosis of unspecified deep veins of right lower extremity: Secondary | ICD-10-CM

## 2018-07-01 MED ORDER — APIXABAN 5 MG PO TABS: 5.0000 mg | ORAL_TABLET | Freq: Two times a day (BID) | ORAL | 0 refills | Status: DC

## 2018-07-01 NOTE — Telephone Encounter (Signed)
Patient requests refill of Eliquis sent to [facility].

## 2018-07-10 ENCOUNTER — Other Ambulatory Visit: Payer: Self-pay

## 2018-07-10 ENCOUNTER — Other Ambulatory Visit: Payer: BLUE CROSS/BLUE SHIELD

## 2018-07-10 DIAGNOSIS — E039 Hypothyroidism, unspecified: Secondary | ICD-10-CM

## 2018-07-11 ENCOUNTER — Telehealth: Payer: Self-pay | Admitting: Obstetrics and Gynecology

## 2018-07-11 ENCOUNTER — Telehealth: Payer: Self-pay | Admitting: Internal Medicine

## 2018-07-11 DIAGNOSIS — E039 Hypothyroidism, unspecified: Secondary | ICD-10-CM

## 2018-07-11 LAB — TSH+FREE T4
Free T4: 1.42 ng/dL (ref 0.82–1.77)
TSH: 2.7 u[IU]/mL (ref 0.450–4.500)

## 2018-07-11 MED ORDER — SYNTHROID 112 MCG PO TABS: 112.0000 ug | ORAL_TABLET | Freq: Every day | ORAL | 1 refills | Status: DC

## 2018-07-11 MED ORDER — SYNTHROID 112 MCG PO TABS: 112.0000 ug | ORAL_TABLET | Freq: Every day | ORAL | 0 refills | Status: DC

## 2018-07-11 NOTE — Telephone Encounter (Signed)
Pt aware of normal thyroid labs. Needs Rx RF synthroid BRAND only 112 mcg QD. Rx RF for 1 mo to [facility], remainder to [facility]. Rechk at 10/20 annual.

## 2018-07-14 ENCOUNTER — Inpatient Hospital Stay: Payer: BLUE CROSS/BLUE SHIELD | Attending: Internal Medicine

## 2018-07-14 ENCOUNTER — Inpatient Hospital Stay: Payer: BLUE CROSS/BLUE SHIELD | Admitting: Internal Medicine

## 2018-07-14 ENCOUNTER — Other Ambulatory Visit: Payer: Self-pay

## 2018-07-14 DIAGNOSIS — I82502 Chronic embolism and thrombosis of unspecified deep veins of left lower extremity: Secondary | ICD-10-CM

## 2018-07-14 DIAGNOSIS — Z7901 Long term (current) use of anticoagulants: Secondary | ICD-10-CM | POA: Insufficient documentation

## 2018-07-14 DIAGNOSIS — I82401 Acute embolism and thrombosis of unspecified deep veins of right lower extremity: Secondary | ICD-10-CM | POA: Diagnosis not present

## 2018-07-14 LAB — CBC WITH DIFFERENTIAL/PLATELET
Abs Immature Granulocytes: 0.01 10*3/uL (ref 0.00–0.07)
Basophils Absolute: 0.1 10*3/uL (ref 0.0–0.1)
Basophils Relative: 1 %
Eosinophils Absolute: 0.1 10*3/uL (ref 0.0–0.5)
Eosinophils Relative: 2 %
HCT: 41.3 % (ref 36.0–46.0)
Hemoglobin: 13.6 g/dL (ref 12.0–15.0)
Immature Granulocytes: 0 %
Lymphocytes Relative: 34 %
Lymphs Abs: 2.1 10*3/uL (ref 0.7–4.0)
MCH: 28.8 pg (ref 26.0–34.0)
MCHC: 32.9 g/dL (ref 30.0–36.0)
MCV: 87.3 fL (ref 80.0–100.0)
Monocytes Absolute: 0.3 10*3/uL (ref 0.1–1.0)
Monocytes Relative: 5 %
Neutro Abs: 3.7 10*3/uL (ref 1.7–7.7)
Neutrophils Relative %: 58 %
Platelets: 365 10*3/uL (ref 150–400)
RBC: 4.73 MIL/uL (ref 3.87–5.11)
RDW: 12.8 % (ref 11.5–15.5)
WBC: 6.4 10*3/uL (ref 4.0–10.5)
nRBC: 0 % (ref 0.0–0.2)

## 2018-07-14 LAB — COMPREHENSIVE METABOLIC PANEL
ALT: 14 U/L (ref 0–44)
AST: 17 U/L (ref 15–41)
Albumin: 4 g/dL (ref 3.5–5.0)
Alkaline Phosphatase: 101 U/L (ref 38–126)
Anion gap: 10 (ref 5–15)
BUN: 12 mg/dL (ref 6–20)
CO2: 25 mmol/L (ref 22–32)
Calcium: 9.2 mg/dL (ref 8.9–10.3)
Chloride: 103 mmol/L (ref 98–111)
Creatinine, Ser: 0.96 mg/dL (ref 0.44–1.00)
GFR calc Af Amer: >60 mL/min (ref >60)
GFR calc non Af Amer: >60 mL/min (ref >60)
Glucose, Bld: 99 mg/dL (ref 70–99)
Potassium: 4.4 mmol/L (ref 3.5–5.1)
Sodium: 138 mmol/L (ref 135–145)
Total Bilirubin: 0.4 mg/dL (ref 0.3–1.2)
Total Protein: 7.9 g/dL (ref 6.5–8.1)

## 2018-07-16 ENCOUNTER — Encounter: Payer: Self-pay | Admitting: Internal Medicine

## 2018-07-16 ENCOUNTER — Inpatient Hospital Stay (HOSPITAL_BASED_OUTPATIENT_CLINIC_OR_DEPARTMENT_OTHER): Payer: BLUE CROSS/BLUE SHIELD | Admitting: Internal Medicine

## 2018-07-16 ENCOUNTER — Other Ambulatory Visit: Payer: Self-pay

## 2018-07-16 DIAGNOSIS — Z7901 Long term (current) use of anticoagulants: Secondary | ICD-10-CM | POA: Diagnosis not present

## 2018-07-16 DIAGNOSIS — I82401 Acute embolism and thrombosis of unspecified deep veins of right lower extremity: Secondary | ICD-10-CM | POA: Diagnosis not present

## 2018-07-16 DIAGNOSIS — I82502 Chronic embolism and thrombosis of unspecified deep veins of left lower extremity: Secondary | ICD-10-CM | POA: Diagnosis not present

## 2018-07-16 MED ORDER — APIXABAN 5 MG PO TABS: 5.0000 mg | ORAL_TABLET | Freq: Two times a day (BID) | ORAL | 6 refills | Status: DC

## 2018-07-16 NOTE — Assessment & Plan Note (Addendum)
#  Recurrent DVT/[name] [with acquired risk factors limited mobility-ankle surgery].  On Eliquis. Stable.  Refill sent.  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  Stable.    #Chronic fatigue/chronic myalgias-question etiology; Stable.  # hair loss- ? Eliquis not related. Recommend PO iron every other day.    # DISPOSITION: # follow up in 6 months- MD/labs- cbc/cmp-Dr.B

## 2018-07-16 NOTE — Progress Notes (Signed)
I connected with Taylor Frederick on 07/16/18 at  2:30 PM EDT by telephone visit and verified that I am speaking with the correct person using two identifiers.  I discussed the limitations, risks, security and privacy concerns of performing an evaluation and management service by telemedicine and the availability of in-person appointments. I also discussed with the patient that there may be a patient responsible charge related to this service. The patient expressed understanding and agreed to proceed.    Other persons participating in the visit and their role in the encounter: None Patient's location: Home Provider's location: Office   No history exists.     Chief Complaint: DVT   History of present illness:Taylor Frederick 58 y.o.  female with history of history of recurrent left lower extremity DVT on indefinite Eliquis.  Patient denies any unusual leg swelling or leg pain.  She has chronic swelling of the left lower leg.  Not any worse.  Complains of hair loss.  Complains of chronic fatigue.  No blood in stools or black-colored stools  Observation/objective: Hemoglobin 13.  Chemistries within normal limits  Assessment and plan: Leg DVT (deep venous thromboembolism), chronic, left (HCC) #Recurrent DVT/[name] [with acquired risk factors limited mobility-ankle surgery].  On Eliquis. Stable.   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  Stable.    #Chronic fatigue/chronic myalgias-question etiology; Stable.  # hair loss- ? Eliquis not related. Recommend PO iron every other day.    # DISPOSITION: # follow up in 6 months- MD/labs- cbc/cmp-Dr.B      Follow-up instructions:  I discussed the assessment and treatment plan with the patient.  The patient was provided an opportunity to ask questions and all were answered.  The patient agreed with the plan and demonstrated  understanding of instructions.  The patient was advised to call back or seek an in person evaluation if the symptoms worsen or if the condition fails to improve as anticipated.  I provided 12  minutes of non face-to-face telephone visit time during this encounter, and > 50% was spent counseling as documented under my assessment & plan.   Dr. Louretta Shorten [facility] at [facility] 07/16/2018 2:43 PM

## 2018-07-25 ENCOUNTER — Emergency Department
Admission: EM | Admit: 2018-07-25 | Discharge: 2018-07-25 | Disposition: A | Discharge status: Home / Self Care | Payer: BLUE CROSS/BLUE SHIELD | Attending: Emergency Medicine | Admitting: Emergency Medicine

## 2018-07-25 ENCOUNTER — Other Ambulatory Visit: Payer: Self-pay

## 2018-07-25 ENCOUNTER — Emergency Department: Payer: BLUE CROSS/BLUE SHIELD

## 2018-07-25 ENCOUNTER — Encounter: Payer: Self-pay | Admitting: Emergency Medicine

## 2018-07-25 DIAGNOSIS — E039 Hypothyroidism, unspecified: Secondary | ICD-10-CM | POA: Insufficient documentation

## 2018-07-25 DIAGNOSIS — R2242 Localized swelling, mass and lump, left lower limb: Secondary | ICD-10-CM | POA: Insufficient documentation

## 2018-07-25 DIAGNOSIS — Z79899 Other long term (current) drug therapy: Secondary | ICD-10-CM | POA: Diagnosis not present

## 2018-07-25 DIAGNOSIS — Z86718 Personal history of other venous thrombosis and embolism: Secondary | ICD-10-CM | POA: Insufficient documentation

## 2018-07-25 DIAGNOSIS — M7989 Other specified soft tissue disorders: Secondary | ICD-10-CM | POA: Diagnosis not present

## 2018-07-25 DIAGNOSIS — R600 Localized edema: Secondary | ICD-10-CM | POA: Diagnosis not present

## 2018-07-25 DIAGNOSIS — D6851 Activated protein C resistance: Secondary | ICD-10-CM | POA: Diagnosis not present

## 2018-07-25 DIAGNOSIS — M79662 Pain in left lower leg: Secondary | ICD-10-CM | POA: Diagnosis not present

## 2018-07-25 DIAGNOSIS — M25572 Pain in left ankle and joints of left foot: Secondary | ICD-10-CM | POA: Diagnosis not present

## 2018-07-25 LAB — CBC
HCT: 44 % (ref 36.0–46.0)
Hemoglobin: 14.5 g/dL (ref 12.0–15.0)
MCH: 28.6 pg (ref 26.0–34.0)
MCHC: 33 g/dL (ref 30.0–36.0)
MCV: 86.8 fL (ref 80.0–100.0)
Platelets: 398 10*3/uL (ref 150–400)
RBC: 5.07 MIL/uL (ref 3.87–5.11)
RDW: 12.9 % (ref 11.5–15.5)
WBC: 8.2 10*3/uL (ref 4.0–10.5)
nRBC: 0 % (ref 0.0–0.2)

## 2018-07-25 LAB — COMPREHENSIVE METABOLIC PANEL
ALT: 16 U/L (ref 0–44)
AST: 18 U/L (ref 15–41)
Albumin: 4.5 g/dL (ref 3.5–5.0)
Alkaline Phosphatase: 104 U/L (ref 38–126)
Anion gap: 10 (ref 5–15)
BUN: 11 mg/dL (ref 6–20)
CO2: 27 mmol/L (ref 22–32)
Calcium: 9.7 mg/dL (ref 8.9–10.3)
Chloride: 101 mmol/L (ref 98–111)
Creatinine, Ser: 0.8 mg/dL (ref 0.44–1.00)
GFR calc Af Amer: >60 mL/min (ref >60)
GFR calc non Af Amer: >60 mL/min (ref >60)
Glucose, Bld: 108 mg/dL — ABNORMAL HIGH (ref 70–99)
Potassium: 4.1 mmol/L (ref 3.5–5.1)
Sodium: 138 mmol/L (ref 135–145)
Total Bilirubin: 0.4 mg/dL (ref 0.3–1.2)
Total Protein: 8.7 g/dL — ABNORMAL HIGH (ref 6.5–8.1)

## 2018-07-25 LAB — APTT: aPTT: 40 seconds — ABNORMAL HIGH (ref 24–36)

## 2018-07-25 LAB — PROTIME-INR
INR: 1.1 (ref 0.8–1.2)
Prothrombin Time: 14.4 seconds (ref 11.4–15.2)

## 2018-07-25 NOTE — ED Notes (Signed)
See triage note  Presents with pain and swelling to left lower leg  States she usually has swelling to both legs  But yesterday she developed pain with swelling yesterday  Denies any injury  States she is here to r/o DVT

## 2018-07-25 NOTE — Discharge Instructions (Addendum)
Follow-up with your regular doctor.  Please call for an appointment.  Return emergency department worsening. 

## 2018-07-25 NOTE — ED Triage Notes (Signed)
Pt reports swelling and pain to left lower leg since yesterday. Pt states hx of DVT's

## 2018-07-25 NOTE — ED Provider Notes (Signed)
[facility] Emergency Department Provider Note  ____________________________________________   First MD Initiated Contact with Patient 07/25/18 1251     (approximate)  I have reviewed the triage vital signs and the nursing notes.   HISTORY  Chief Complaint Leg Swelling and Leg Pain    HPI Chinyere Galiano is a 58 y.o. female presents emergency department complaining of pain and swelling to the lower leg.  She states she has a history of ankle surgery to the leg.  She was seeing orthopedics today in which they told her the hardware looked good.  They did see some soft tissue swelling a questionable fluid collection in the lower leg.  They had advised her to come here for a ultrasound of the left lower leg.  She denies any chest pain or shortness of breath.  Patient also is on Eliquis twice daily.    Past Medical History:  Diagnosis Date  . Asymptomatic varicose veins   . Diffuse cystic mastopathy   . DVT (deep venous thrombosis) ([place]) 2003   With BCP's  . Edema of both legs   . Hypothyroidism   . Venous insufficiency     Patient Active Problem List   Diagnosis Date Noted  . Vaginal dryness, menopausal 12/12/2017  . Vitamin D deficiency 11/06/2016  . Vaginal lesion 11/02/2016  . Screening for colon cancer 11/02/2016  . Bilateral edema of lower extremity 07/12/2016  . Factor V Leiden mutation ([place]) 01/24/2016  . Chest pain 01/19/2016  . Leg DVT (deep venous thromboembolism), chronic, left ([place]) 01/12/2016  . 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 DVT of leg (deep venous thrombosis) ([place]) 03/01/2015  . Chest pain, pleuritic 02/23/2015  . Pulmonary embolism ([place]) 02/23/2015  . Closed pilon fracture 02/04/2015  . Other fracture of upper and lower end of left fibula, initial encounter for closed fracture 02/04/2015  . Closed left tibial fracture 01/30/2015  . Fibrocystic disease of both breasts  09/21/2014  . Routine general medical examination at a health care facility 07/10/2013  . Fatigue 06/23/2012  . Hypothyroidism 10/19/2009  . VENOUS INSUFFICIENCY 10/19/2009    Past Surgical History:  Procedure Laterality Date  . DILATION AND CURETTAGE OF UTERUS  ~2003  . LEG SURGERY  01/2015  . ORIF ANKLE FRACTURE Left 01/31/2015   Procedure: OPEN REDUCTION INTERNAL FIXATION (ORIF) DISTAL TIBIA  FRACTURE;  Surgeon: Corky Mull, MD;  [name];  Service: Orthopedics;  Laterality: Left;  . PERIPHERAL VASCULAR CATHETERIZATION N/A 02/24/2015   Procedure: IVC Filter Insertion;  Surgeon: Algernon Huxley, MD;  Location: [place] [facility];  Service: Cardiovascular;  [name];  . PERIPHERAL VASCULAR CATHETERIZATION N/A 06/20/2015   Procedure: IVC Filter Removal;  Surgeon: Algernon Huxley, MD;  Location: [place] [facility];  Service: Cardiovascular;  [name];  . TUBAL LIGATION  2005  . vein closure Left 2008   Left Leg  . Vein Procedure     Dr. Jamal Collin    Prior to Admission medications   Medication Sig Start Date End Date Taking? Authorizing Provider  apixaban (ELIQUIS) 5 MG TABS tablet Take 1 tablet (5 mg total) by mouth 2 (two) times daily. 07/16/18   Cammie Sickle, MD  SYNTHROID 112 MCG tablet Take 1 tablet (112 mcg total) by mouth daily before breakfast. 06/11/86   Copland, Deirdre Evener, PA-C    Allergies Estrogens  Family History  Problem Relation Age of Onset  . Heart disease  Mother        CHF  . Diabetes Mother   . Hypothyroidism Mother   . Cancer Maternal Aunt        Breast and Ovarian  (Believes BRCA positive)  . Cancer Other        Breast CA (Believes BRCA positive) (mom had br/ovar ca)  . Cancer Maternal Aunt        Melanoma  . Cancer Father        stomach    Social History Social History   Tobacco Use  . Smoking status: Never Smoker  . Smokeless tobacco: Never Used  Substance Use Topics  . Alcohol use: No  . Drug use: No    Review of  Systems  Constitutional: No fever/chills Eyes: No visual changes. ENT: No sore throat. Respiratory: Denies cough Genitourinary: Negative for dysuria. Musculoskeletal: Negative for back pain.  Left lower leg pain Skin: Negative for rash.    ____________________________________________   PHYSICAL EXAM:  VITAL SIGNS: ED Triage Vitals  Enc Vitals Group     BP 07/25/18 1245 135/76     Pulse Rate 07/25/18 1245 71     Resp 07/25/18 1245 18     Temp 07/25/18 1245 98.6 F (37 C)     Temp Source 07/25/18 1245 Oral     SpO2 07/25/18 1245 99 %     Weight 07/25/18 1232 180 lb (81.6 kg)     Height 07/25/18 1232 _0  (1.6 m)     Head Circumference --      Peak Flow --      Pain Score 07/25/18 1232 4     Pain Loc --      Pain Edu? --      Excl. in [place]? --     Constitutional: Alert and oriented. Well appearing and in no acute distress. Eyes: Conjunctivae are normal.  Head: Atraumatic. Nose: No congestion/rhinnorhea. Mouth/Throat: Mucous membranes are moist.   Neck:  supple no lymphadenopathy noted Cardiovascular: Normal rate, regular rhythm. Heart sounds are normal Respiratory: Normal respiratory effort.  No retractions, lungs c t a  GU: deferred Musculoskeletal: FROM all extremities, warm and well perfused, swelling noted at the left ankle into the left lower calf, no tenderness noted in the calf, neurovascular is intact Neurologic:  Normal speech and language.  Skin:  Skin is warm, dry and intact. No rash noted. Psychiatric: Mood and affect are normal. Speech and behavior are normal.  ____________________________________________   LABS (all labs ordered are listed, but only abnormal results are displayed)  Labs Reviewed  COMPREHENSIVE METABOLIC PANEL - Abnormal; Notable for the following components:      Result Value   Glucose, Bld 108 (*)    Total Protein 8.7 (*)    All other components within normal limits  APTT - Abnormal; Notable for the following components:    aPTT 40 (*)    All other components within normal limits  CBC  PROTIME-INR   ____________________________________________   ____________________________________________  RADIOLOGY  Ultrasound of the left lower leg is neg vor dvt Xray of tib/fib is neg  ____________________________________________   PROCEDURES  Procedure(s) performed: No  Procedures    ____________________________________________   INITIAL IMPRESSION / ASSESSMENT AND PLAN / ED COURSE  Pertinent labs & imaging results that were available during my care of the patient were reviewed by me and considered in my medical decision making (see chart for details).   Patient is a 58 year old female presents emergency department complaint of  left lower leg pain.  Physical exam shows swelling along the left lower leg.  Ultrasound left lower leg ordered  Labs were ordered from triage included a comprehensive metabolic panel which basically normal, INR which is 1.1 which is normal, PTT 40, and CBC is normal.  Ultrasound left lower extremity is negative for DVT, x-ray of tib-fib is negative for any acute bony lesion  Explained all test results with patient.  She is to follow-up with her regular doctor.  She is to wear support stockings to decrease the swelling and inflammation of the lower legs.  States she understands will comply appears discharged stable condition.    As part of my medical decision making, I reviewed the following data within the [place] notes reviewed and incorporated, Labs reviewed see above, Old chart reviewed, Radiograph reviewed ultrasound and x-rays are negative, Notes from prior ED visits and [place] Controlled Substance Database  ____________________________________________   FINAL CLINICAL IMPRESSION(S) / ED DIAGNOSES  Final diagnoses:  Leg swelling      NEW MEDICATIONS STARTED DURING THIS VISIT:  New Prescriptions   No medications on file     Note:   This document was prepared using Dragon voice recognition software and may include unintentional dictation errors.    Versie Starks, PA-C 07/25/18 1547    Lavonia Drafts, MD 07/26/18 1536

## 2018-07-31 ENCOUNTER — Telehealth: Payer: Self-pay

## 2018-07-31 NOTE — Telephone Encounter (Signed)
Spoke to pt pt. She said she was doing a little better. She said she will call back to schedule an appt.

## 2019-01-16 ENCOUNTER — Inpatient Hospital Stay: Payer: BC Managed Care – PPO | Attending: Internal Medicine

## 2019-01-16 ENCOUNTER — Encounter: Payer: Self-pay | Admitting: Internal Medicine

## 2019-01-16 ENCOUNTER — Other Ambulatory Visit: Payer: Self-pay | Admitting: *Deleted

## 2019-01-16 ENCOUNTER — Inpatient Hospital Stay (HOSPITAL_BASED_OUTPATIENT_CLINIC_OR_DEPARTMENT_OTHER): Payer: BC Managed Care – PPO | Admitting: Internal Medicine

## 2019-01-16 ENCOUNTER — Other Ambulatory Visit: Payer: Self-pay

## 2019-01-16 DIAGNOSIS — Z7901 Long term (current) use of anticoagulants: Secondary | ICD-10-CM | POA: Diagnosis not present

## 2019-01-16 DIAGNOSIS — D6851 Activated protein C resistance: Secondary | ICD-10-CM | POA: Insufficient documentation

## 2019-01-16 DIAGNOSIS — R5382 Chronic fatigue, unspecified: Secondary | ICD-10-CM | POA: Insufficient documentation

## 2019-01-16 DIAGNOSIS — I82401 Acute embolism and thrombosis of unspecified deep veins of right lower extremity: Secondary | ICD-10-CM

## 2019-01-16 DIAGNOSIS — I82502 Chronic embolism and thrombosis of unspecified deep veins of left lower extremity: Secondary | ICD-10-CM

## 2019-01-16 DIAGNOSIS — M791 Myalgia, unspecified site: Secondary | ICD-10-CM | POA: Insufficient documentation

## 2019-01-16 DIAGNOSIS — M7989 Other specified soft tissue disorders: Secondary | ICD-10-CM | POA: Diagnosis not present

## 2019-01-16 LAB — CBC WITH DIFFERENTIAL/PLATELET
Abs Immature Granulocytes: 0.02 10*3/uL (ref 0.00–0.07)
Basophils Absolute: 0.1 10*3/uL (ref 0.0–0.1)
Basophils Relative: 1 %
Eosinophils Absolute: 0.1 10*3/uL (ref 0.0–0.5)
Eosinophils Relative: 1 %
HCT: 42.3 % (ref 36.0–46.0)
Hemoglobin: 13.8 g/dL (ref 12.0–15.0)
Immature Granulocytes: 0 %
Lymphocytes Relative: 33 %
Lymphs Abs: 2.6 10*3/uL (ref 0.7–4.0)
MCH: 28.5 pg (ref 26.0–34.0)
MCHC: 32.6 g/dL (ref 30.0–36.0)
MCV: 87.2 fL (ref 80.0–100.0)
Monocytes Absolute: 0.3 10*3/uL (ref 0.1–1.0)
Monocytes Relative: 4 %
Neutro Abs: 4.7 10*3/uL (ref 1.7–7.7)
Neutrophils Relative %: 61 %
Platelets: 374 10*3/uL (ref 150–400)
RBC: 4.85 MIL/uL (ref 3.87–5.11)
RDW: 12.7 % (ref 11.5–15.5)
WBC: 7.8 10*3/uL (ref 4.0–10.5)
nRBC: 0 % (ref 0.0–0.2)

## 2019-01-16 LAB — COMPREHENSIVE METABOLIC PANEL
ALT: 14 U/L (ref 0–44)
AST: 19 U/L (ref 15–41)
Albumin: 4 g/dL (ref 3.5–5.0)
Alkaline Phosphatase: 91 U/L (ref 38–126)
Anion gap: 6 (ref 5–15)
BUN: 11 mg/dL (ref 6–20)
CO2: 27 mmol/L (ref 22–32)
Calcium: 8.9 mg/dL (ref 8.9–10.3)
Chloride: 105 mmol/L (ref 98–111)
Creatinine, Ser: 0.84 mg/dL (ref 0.44–1.00)
GFR calc Af Amer: >60 mL/min (ref >60)
GFR calc non Af Amer: >60 mL/min (ref >60)
Glucose, Bld: 138 mg/dL — ABNORMAL HIGH (ref 70–99)
Potassium: 4.4 mmol/L (ref 3.5–5.1)
Sodium: 138 mmol/L (ref 135–145)
Total Bilirubin: 0.4 mg/dL (ref 0.3–1.2)
Total Protein: 7.7 g/dL (ref 6.5–8.1)

## 2019-01-16 MED ORDER — APIXABAN 5 MG PO TABS: 5.0000 mg | ORAL_TABLET | Freq: Two times a day (BID) | ORAL | 1 refills | Status: DC

## 2019-01-16 NOTE — Assessment & Plan Note (Addendum)
#  Recurrent DVT/[name] [with acquired risk factors limited mobility-ankle surgery].  On indefinite anticoagulation.  On Eliquis.  Stable.  Prescription sent to [facility] 3 months at a time.  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  Stable   # Chronic fatigue/chronic myalgias-question etiology; stable  # DISPOSITION: # follow up in 6 months- MD/labs- cbc/cmp-Dr.B

## 2019-01-16 NOTE — Progress Notes (Signed)
[place] NOTE  Patient Care Team: Venia Carbon, [name], 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; Superficial superficial vein- Left Medial LLE- started on Lovenox 60m BID; June 2017- On Eliquis BID  HISTORY OF PRESENTING ILLNESS:  [place] 58y.o.  female above history of recurrent DVT; prior history of PE- currently on Eliquis.  Patient continues to have intermittent swelling of the lower extremities left more than right.  Denies any new shortness of breath or cough he denies any blood in stools or black or stools.   Review of Systems  Constitutional: Positive for malaise/fatigue. Negative for chills, diaphoresis, [name], hemoptysis, sputum production, [name], palpitations and orthopnea.  Gastrointestinal: Negative for abdominal pain, blood in stool, constipation, diarrhea, heartburn, melena, nausea and vomiting.  Genitourinary: Negative for dysuria, frequency and urgency.  Musculoskeletal: Negative for back pain.  Skin: Negative.  Negative for itching and rash.  Neurological: Negative for dizziness, tingling, focal weakness, weakness and headaches.  Endo/Heme/Allergies: Does not bruise/bleed easily.  Psychiatric/Behavioral: Negative for depression. The patient is  not nervous/anxious and does not have insomnia.      MEDICAL HISTORY:  Past Medical History:  Diagnosis Date  . Asymptomatic varicose veins   . Diffuse cystic mastopathy   . DVT (deep [place]) ([place] 2003   With BCP's  . Edema of both legs   . Hypothyroidism   . Venous insufficiency     SURGICAL HISTORY: Past Surgical History:  Procedure Laterality Date  . DILATION AND CURETTAGE OF UTERUS  ~2003  . LEG SURGERY  01/2015  . ORIF ANKLE FRACTURE Left 01/31/2015   Procedure: OPEN REDUCTION INTERNAL FIXATION (ORIF) DISTAL TIBIA  FRACTURE;  [name];  [name];  Service: Orthopedics;  Laterality: Left;  . PERIPHERAL VASCULAR CATHETERIZATION N/A 02/24/2015   Procedure: IVC Filter Insertion;  [name];  [place] [facility];  Service: Cardiovascular;  [name];  . PERIPHERAL VASCULAR CATHETERIZATION N/A 06/20/2015   Procedure: IVC Filter Removal;  [name];  [facility];  Service: Cardiovascular;  [name];  . TUBAL LIGATION  2005  . vein closure Left 2008   Left Leg  . Vein Procedure     Dr. SJamal Collin   SOCIAL HISTORY: Social History   Socioeconomic History  . Marital status: Married    Spouse name: Not on file  . Number of children: 1  . Years of education: Not on file  . Highest education level: Not on file  Occupational History  . Occupation: OGlass blower/designer   Comment: BEducational psychologist . Occupation:      Comment:    Social Needs  . Financial resource strain: Not on file  . Food  insecurity    Worry: Not on file    Inability: Not on file  . Transportation needs    Medical: Not on file    Non-medical: Not on file  Tobacco Use  . Smoking status: Never Smoker  . Smokeless tobacco: Never Used  Substance and Sexual Activity  . Alcohol use: No  . Drug use: No  . Sexual activity: Yes  Lifestyle  . Physical activity    Days per week: Not on file    Minutes  per session: Not on file  . Stress: Not on file  Relationships  . Social Herbalist on phone: Not on file    Gets together: Not on file    Attends religious service: Not on file    Active member of club or organization: Not on file    Attends meetings of clubs or organizations: Not on file    Relationship status: Not on file  . Intimate partner violence    Fear of current or ex partner: Not on file    Emotionally abused: Not on file    Physically abused: Not on file    Forced sexual activity: Not on file  Other Topics Concern  . Not on file  Social History Narrative  . Not on file    FAMILY HISTORY: Family History  Problem Relation Age of Onset  . Heart disease Mother        CHF  . Diabetes Mother   . Hypothyroidism Mother   . Cancer Maternal Aunt        Breast and Ovarian  (Believes BRCA positive)  . Cancer Other        Breast CA (Believes BRCA positive) (mom had br/ovar ca)  . Cancer Maternal Aunt        Melanoma  . Cancer Father        stomach    ALLERGIES:  is allergic to estrogens.  MEDICATIONS:  Current Outpatient Medications  Medication Sig Dispense Refill  . apixaban (ELIQUIS) 5 MG TABS tablet Take 1 tablet (5 mg total) by mouth 2 (two) times daily. 180 tablet 1  . SYNTHROID 112 MCG tablet Take 1 tablet (112 mcg total) by mouth daily before breakfast. 90 tablet 1   No current facility-administered medications for this visit.       Marland Kitchen  PHYSICAL EXAMINATION:   Vitals:   01/16/19 1328  BP: 124/81  Pulse: 73  Temp: 97.9 F (36.6 C)   Filed Weights   01/16/19 1328  Weight: 185 lb (83.9 kg)    Physical Exam  Constitutional: She is oriented to person, place, and time and well-developed, well-nourished, and in no distress.  HENT:  Head: Normocephalic and atraumatic.  Mouth/Throat: Oropharynx is clear and moist. No oropharyngeal exudate.  Eyes: Pupils are equal, round, and reactive to light.  Neck: Normal range of motion. Neck supple.   Cardiovascular: Normal rate and regular rhythm.  Pulmonary/Chest: Effort normal and breath sounds normal. No respiratory distress. She has no wheezes.  Abdominal: Soft. Bowel sounds are normal. She exhibits no distension and no mass. There is no abdominal tenderness. There is no rebound and no guarding.  Musculoskeletal: Normal range of motion.        General: No tenderness or edema.  Neurological: She is alert and oriented to person, place, and time.  Skin: Skin is warm.  Psychiatric: Affect normal.    LABORATORY DATA:  I have reviewed the data as listed Lab Results  Component  Value Date   WBC 7.8 01/16/2019   HGB 13.8 01/16/2019   HCT 42.3 01/16/2019   MCV 87.2 01/16/2019   PLT 374 01/16/2019   Recent Labs    07/14/18 0949 07/25/18 1240 01/16/19 1312  NA 138 138 138  K 4.4 4.1 4.4  CL 103 101 105  CO2 25 27 27   GLUCOSE 99 108* 138*  BUN 12 11 11   CREATININE 0.96 0.80 0.84  CALCIUM 9.2 9.7 8.9  GFRNONAA >60 >60 >60  GFRAA >60 >60 >60  PROT 7.9 8.7* 7.7  ALBUMIN 4.0 4.5 4.0  AST 17 18 19   ALT 14 16 14   ALKPHOS 101 104 91  BILITOT 0.4 0.4 0.4    RADIOGRAPHIC STUDIES: I have personally reviewed the radiological images as listed and agreed with the findings in the report. No results found.  ASSESSMENT & PLAN:   Leg DVT (deep venous thromboembolism), chronic, left ([facility]) #Recurrent DVT/[name] [with acquired risk factors limited mobility-ankle surgery].  On indefinite anticoagulation.  On Eliquis.  Stable.  Prescription sent to [facility] 3 months at a time.  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  Stable   # Chronic fatigue/chronic myalgias-question etiology; stable  # DISPOSITION: # follow up in 6 months- MD/labs- cbc/cmp-Dr.B     Cammie Sickle, MD 01/16/2019 1:51 PM

## 2019-02-13 ENCOUNTER — Other Ambulatory Visit: Payer: Self-pay | Admitting: Obstetrics and Gynecology

## 2019-02-13 ENCOUNTER — Telehealth: Payer: Self-pay | Admitting: Obstetrics and Gynecology

## 2019-02-13 DIAGNOSIS — E039 Hypothyroidism, unspecified: Secondary | ICD-10-CM

## 2019-02-13 MED ORDER — SYNTHROID 112 MCG PO TABS: 112.0000 ug | ORAL_TABLET | Freq: Every day | ORAL | 0 refills | Status: DC

## 2019-02-13 NOTE — Telephone Encounter (Signed)
Pt is requesting brand name SYNTHROID since she does not take generic.

## 2019-02-13 NOTE — Telephone Encounter (Signed)
#  30 sent to [facility] since pt past due for labs. Lab order placed, pt to do before 1/21 annual and [facility].

## 2019-02-13 NOTE — Progress Notes (Signed)
Rx RF synthroid, due for labs. Annual sched 1/21.

## 2019-02-13 NOTE — Telephone Encounter (Signed)
Patient is requesting refill on her prescriptions to be sent to [facility] on [address] [facility] 30 supply then the [facility]. Patient is schedule for 03/19/19 for annual

## 2019-02-16 NOTE — Telephone Encounter (Signed)
Called pt, no answer, LVMTRC. 

## 2019-02-16 NOTE — Telephone Encounter (Signed)
Pt aware.

## 2019-03-18 NOTE — Patient Instructions (Addendum)
I value your feedback and entrusting us with your care. If you get a [facility] patient survey, I would appreciate you taking the time to let us know about your experience today. Thank you!  As of February 12, 2019, your lab results will be released to your MyChart immediately, before I even have a chance to see them. Please give me time to review them and contact you if there are any abnormalities. Thank you for your patience.   [facility]: [phone]  [facility]: [phone]  

## 2019-03-18 NOTE — Progress Notes (Signed)
PCP: Venia Carbon, MD   Chief Complaint  Patient presents with  . Gynecologic Exam    HPI:      [name] is a 59 y.o. G1P1 who LMP was No LMP recorded. (Menstrual status: Perimenopausal)., presents today for her annual examination.  Her menses are absent due to menopause. She does not have intermenstrual bleeding.  Sex activity: single partner, contraception - post menopausal status. She does have vaginal dryness and sex is impossible. She has vag areas that are painful and itchy at times, and then resolve. They really flare with sex as well. She has tried coconut oil with much relief. SHE CAN'T HAVE ESTROGENS DUE TO HX OF 2 DVTs. Pt is on blood thinners and thinks she is Factor V Leiden positive.   Last Pap: Jul 21, 2015  Results were: no abnormalities /neg HPV DNA. No hx of abn paps. Hx of STDs: none  Last mammogram: 11/25/17  Results were: normal--routine follow-up in 12 months. Had been followed by Dr. Jamal Collin.  There is a FH of breast cancer in her mat aunt, who also had ovar ca, and a mat cousin. She has a 2nd aunt with melanoma. Pt's 2nd cousin did genetic testing and she thinks she was "positive". Pt has never had testing done/declined past fews yr. The patient does do self-breast exams.  Colonoscopy: never; scared of procedure. Willing to do cologuard this yr. No rectal bleeding.  Tobacco use: The patient denies current or previous tobacco use. Alcohol use: none  No drug use Exercise: mod active   She does get adequate calcium and Vitamin D in her diet.  Has hypothyroidism and was euthyroid 5/20 labs. On brand Synthroid 112 mcg daily. Due for labs but has been off meds for 2-3 wks. RF sent to local pharm but too expensive. Pt prefers 90 day supply from mail order.    Past Medical History:  Diagnosis Date  . Asymptomatic varicose veins   . Diffuse cystic mastopathy   . DVT (deep venous thrombosis) ([facility]) 2003   With BCP's  . Edema of both legs   .  Hypothyroidism   . Venous insufficiency     Past Surgical History:  Procedure Laterality Date  . DILATION AND CURETTAGE OF UTERUS  ~2003  . LEG SURGERY  01/2015  . ORIF ANKLE FRACTURE Left 01/31/2015   Procedure: OPEN REDUCTION INTERNAL FIXATION (ORIF) DISTAL TIBIA  FRACTURE;  Surgeon: Corky Mull, MD;  [name];  Service: Orthopedics;  Laterality: Left;  . PERIPHERAL VASCULAR CATHETERIZATION N/A 02/24/2015   Procedure: IVC Filter Insertion;  Surgeon: Algernon Huxley, MD;  Location: [facility];  Service: Cardiovascular;  [name];  . PERIPHERAL VASCULAR CATHETERIZATION N/A 06/20/2015   Procedure: IVC Filter Removal;  Surgeon: Algernon Huxley, MD;  Location: [facility];  Service: Cardiovascular;  [name];  . TUBAL LIGATION  2005  . vein closure Left 2008   Left Leg  . Vein Procedure     Dr. Jamal Collin    Family History  Problem Relation Age of Onset  . Heart disease Mother        CHF  . Diabetes Mother   . Hypothyroidism Mother   . Cancer Maternal Aunt        Breast and Ovarian  (Believes BRCA positive)  . Cancer Other        Breast CA (Believes BRCA positive) (mom had br/ovar ca)  . Cancer Maternal Aunt  Melanoma  . Cancer Father        stomach    Social History   Socioeconomic History  . Marital status: Married    Spouse name: Not on file  . Number of children: 1  . Years of education: Not on file  . Highest education level: Not on file  Occupational History  . Occupation: Glass blower/designer    Comment: Educational psychologist  . Occupation:      Comment:    Tobacco Use  . Smoking status: Never Smoker  . Smokeless tobacco: Never Used  Substance and Sexual Activity  . Alcohol use: No  . Drug use: No  . Sexual activity: Yes  Other Topics Concern  . Not on file  Social History Narrative  . Not on file   Social Determinants of Health   Financial Resource Strain:   . Difficulty of Paying Living Expenses: Not on file  Food  Insecurity:   . Worried About Charity fundraiser in the Last Year: Not on file  . Ran Out of Food in the Last Year: Not on file  Transportation Needs:   . Lack of Transportation (Medical): Not on file  . Lack of Transportation (Non-Medical): Not on file  Physical Activity:   . Days of Exercise per Week: Not on file  . Minutes of Exercise per Session: Not on file  Stress:   . Feeling of Stress : Not on file  Social Connections:   . Frequency of Communication with Friends and Family: Not on file  . Frequency of Social Gatherings with Friends and Family: Not on file  . Attends Religious Services: Not on file  . Active Member of Clubs or Organizations: Not on file  . Attends Archivist Meetings: Not on file  . Marital Status: Not on file  Intimate Partner Violence:   . Fear of Current or Ex-Partner: Not on file  . Emotionally Abused: Not on file  . Physically Abused: Not on file  . Sexually Abused: Not on file    Current Meds  Medication Sig  . apixaban (ELIQUIS) 5 MG TABS tablet Take 1 tablet (5 mg total) by mouth 2 (two) times daily.  Marland Kitchen SYNTHROID 112 MCG tablet Take 1 tablet (112 mcg total) by mouth daily before breakfast.  . [DISCONTINUED] SYNTHROID 112 MCG tablet Take 1 tablet (112 mcg total) by mouth daily before breakfast.      ROS:  Review of Systems  Constitutional: Negative for fatigue, fever and unexpected weight change.  Respiratory: Negative for cough, shortness of breath and wheezing.   Cardiovascular: Negative for chest pain, palpitations and leg swelling.  Gastrointestinal: Negative for blood in stool, constipation, diarrhea, nausea and vomiting.  Endocrine: Negative for cold intolerance, heat intolerance and polyuria.  Genitourinary: Positive for dyspareunia and vaginal pain. Negative for dysuria, flank pain, frequency, genital sores, hematuria, menstrual problem, pelvic pain, urgency, vaginal bleeding and vaginal discharge.  Musculoskeletal: Negative  for back pain, joint swelling and myalgias.  Skin: Negative for rash.  Neurological: Negative for dizziness, syncope, light-headedness, numbness and headaches.  Hematological: Negative for adenopathy.  Psychiatric/Behavioral: Negative for agitation, confusion, sleep disturbance and suicidal ideas. The patient is not nervous/anxious.      Objective: BP 100/80   Ht 5' 3"  (1.6 m)   Wt 190 lb (86.2 kg)   BMI 33.66 kg/m    Physical Exam Constitutional:      Appearance: She is well-developed.  Genitourinary:     Vagina, uterus, right  adnexa and left adnexa normal.     No vulval lesion or tenderness noted.     Posterior fourchette lesion present.     No vaginal discharge, erythema or tenderness.     No cervical motion tenderness or polyp.     Uterus is not enlarged or tender.     No right or left adnexal mass present.     Right adnexa not tender.     Left adnexa not tender.     Genitourinary Comments: EXT VAGINAL ATROPHY/PALE TISSUE; NOW HAS RUBBERY THICK AREAS POST FOURCHETTE, C/W WITH LS; NO FISSURES  Rectum:     Guaiac result negative.     No anal fissure or tenderness.  Neck:     Thyroid: No thyromegaly.  Cardiovascular:     Rate and Rhythm: Normal rate and regular rhythm.     Heart sounds: Normal heart sounds. No murmur.  Pulmonary:     Effort: Pulmonary effort is normal.     Breath sounds: Normal breath sounds.  Chest:     Breasts:        Right: No mass, nipple discharge, skin change or tenderness.        Left: No mass, nipple discharge, skin change or tenderness.  Abdominal:     Palpations: Abdomen is soft.     Tenderness: There is no abdominal tenderness. There is no guarding.  Musculoskeletal:        General: Normal range of motion.     Cervical back: Normal range of motion.  Neurological:     General: No focal deficit present.     Mental Status: She is alert and oriented to person, place, and time.     Cranial Nerves: No cranial nerve deficit.  Skin:     General: Skin is warm and dry.  Psychiatric:        Mood and Affect: Mood normal.        Behavior: Behavior normal.        Thought Content: Thought content normal.        Judgment: Judgment normal.  Vitals reviewed.     Assessment/Plan:  Encounter for annual routine gynecological examination  Encounter for screening mammogram for malignant neoplasm of breast - Plan: 3D MAMMOGRAM SCREENING BILATERAL; pt to sched mammo  Family history of breast cancer - Plan: 3D MAMMOGRAM SCREENING BILATERAL; MyRisk testing discussed and pt declines. F/u prn.   Screening for colon cancer - Plan: Cologuard; Colonoscopy declined, ref for cologuard.  Hypothyroidism, unspecified type - Plan: SYNTHROID 112 MCG tablet; Pt due for labs but off meds for 2-3 wks. Rx RF sent to [facility] for cheaper price. RTO in 4 wks for labs. Orders already in system. Will RF meds based on results.   Vaginal itching--Sx worsening. Thought it was vaginal atrophy in past but progressing more to probable LS. Recommended bx for dx and then tx. Pt to consider and f/u if desires. May do at 4 wk lab check appt.    Meds ordered this encounter  Medications  . SYNTHROID 112 MCG tablet    Sig: Take 1 tablet (112 mcg total) by mouth daily before breakfast.    Dispense:  90 tablet    Refill:  0    BRAND ONLY    Order Specific Question:   Supervising Provider    Answer:   Gae Dry [[id number]]            GYN counsel breast self exam, mammography screening, menopause, adequate intake of calcium  and vitamin D, diet and exercise    F/U  Return in about 1 year (around 03/18/2020).  Alicia B. Copland, PA-C 03/19/2019 9:31 AM

## 2019-03-19 ENCOUNTER — Other Ambulatory Visit: Payer: Self-pay

## 2019-03-19 ENCOUNTER — Ambulatory Visit (INDEPENDENT_AMBULATORY_CARE_PROVIDER_SITE_OTHER): Payer: BC Managed Care – PPO | Admitting: Obstetrics and Gynecology

## 2019-03-19 ENCOUNTER — Encounter: Payer: Self-pay | Admitting: Obstetrics and Gynecology

## 2019-03-19 VITALS — BP 100/80 | Ht 63.0 in | Wt 190.0 lb

## 2019-03-19 DIAGNOSIS — Z01411 Encounter for gynecological examination (general) (routine) with abnormal findings: Secondary | ICD-10-CM

## 2019-03-19 DIAGNOSIS — Z1211 Encounter for screening for malignant neoplasm of colon: Secondary | ICD-10-CM

## 2019-03-19 DIAGNOSIS — N898 Other specified noninflammatory disorders of vagina: Secondary | ICD-10-CM | POA: Diagnosis not present

## 2019-03-19 DIAGNOSIS — E039 Hypothyroidism, unspecified: Secondary | ICD-10-CM | POA: Diagnosis not present

## 2019-03-19 DIAGNOSIS — Z803 Family history of malignant neoplasm of breast: Secondary | ICD-10-CM

## 2019-03-19 DIAGNOSIS — Z01419 Encounter for gynecological examination (general) (routine) without abnormal findings: Secondary | ICD-10-CM

## 2019-03-19 DIAGNOSIS — Z1231 Encounter for screening mammogram for malignant neoplasm of breast: Secondary | ICD-10-CM

## 2019-03-19 MED ORDER — SYNTHROID 112 MCG PO TABS: 112.0000 ug | ORAL_TABLET | Freq: Every day | ORAL | 0 refills | Status: DC

## 2019-05-25 ENCOUNTER — Telehealth: Payer: Self-pay

## 2019-05-25 NOTE — Telephone Encounter (Signed)
Patient states she is planning on completing the cologuard testing.

## 2019-05-25 NOTE — Telephone Encounter (Signed)
Per ABC, called pt to follow up on cologuard test, is she planning on completing test? No answer, LVMTRC.

## 2019-06-18 ENCOUNTER — Other Ambulatory Visit: Payer: BC Managed Care – PPO

## 2019-06-18 ENCOUNTER — Other Ambulatory Visit: Payer: Self-pay

## 2019-06-18 DIAGNOSIS — E039 Hypothyroidism, unspecified: Secondary | ICD-10-CM | POA: Diagnosis not present

## 2019-06-18 NOTE — Addendum Note (Signed)
Addended by: Althea Grimmer B on: 06/18/2019 09:25 AM   Modules accepted: Orders

## 2019-06-18 NOTE — Patient Instructions (Incomplete)
I value your feedback and entrusting us with your care. If you get a [facility] patient survey, I would appreciate you taking the time to let us know about your experience today. Thank you!  As of February 12, 2019, your lab results will be released to your MyChart immediately, before I even have a chance to see them. Please give me time to review them and contact you if there are any abnormalities. Thank you for your patience.  

## 2019-06-19 ENCOUNTER — Other Ambulatory Visit: Payer: Self-pay | Admitting: Obstetrics and Gynecology

## 2019-06-19 DIAGNOSIS — E039 Hypothyroidism, unspecified: Secondary | ICD-10-CM

## 2019-06-19 LAB — TSH+FREE T4
Free T4: 1.57 ng/dL (ref 0.82–1.77)
TSH: 0.408 u[IU]/mL — ABNORMAL LOW (ref 0.450–4.500)

## 2019-06-19 MED ORDER — SYNTHROID 112 MCG PO TABS: 112.0000 ug | ORAL_TABLET | Freq: Every day | ORAL | 0 refills | Status: DC

## 2019-06-19 NOTE — Progress Notes (Signed)
Pls let pt know TSH slightly too low. Cont levo 112 mcg daily except take 1/2 tab (instead of full tab) on Sundays only. Will rechck labs in 4 wks. Order placed. Rx RF eRxd to mail order for pt.

## 2019-06-19 NOTE — Progress Notes (Signed)
PT aware of results. Offered to transfer her to front desk to schedule appt, says she will call back to schedule appt.

## 2019-07-17 ENCOUNTER — Inpatient Hospital Stay: Payer: BC Managed Care – PPO | Admitting: Internal Medicine

## 2019-07-17 ENCOUNTER — Inpatient Hospital Stay: Payer: BC Managed Care – PPO

## 2019-07-28 ENCOUNTER — Encounter: Payer: Self-pay | Admitting: Internal Medicine

## 2019-07-28 ENCOUNTER — Inpatient Hospital Stay: Payer: BC Managed Care – PPO | Attending: Internal Medicine | Admitting: Internal Medicine

## 2019-07-28 ENCOUNTER — Inpatient Hospital Stay: Payer: BC Managed Care – PPO

## 2019-07-28 ENCOUNTER — Other Ambulatory Visit: Payer: Self-pay

## 2019-07-28 DIAGNOSIS — M7989 Other specified soft tissue disorders: Secondary | ICD-10-CM | POA: Insufficient documentation

## 2019-07-28 DIAGNOSIS — Z86711 Personal history of pulmonary embolism: Secondary | ICD-10-CM | POA: Diagnosis not present

## 2019-07-28 DIAGNOSIS — Z7901 Long term (current) use of anticoagulants: Secondary | ICD-10-CM | POA: Diagnosis not present

## 2019-07-28 DIAGNOSIS — I82502 Chronic embolism and thrombosis of unspecified deep veins of left lower extremity: Secondary | ICD-10-CM | POA: Insufficient documentation

## 2019-07-28 DIAGNOSIS — I82401 Acute embolism and thrombosis of unspecified deep veins of right lower extremity: Secondary | ICD-10-CM

## 2019-07-28 LAB — CBC WITH DIFFERENTIAL/PLATELET
Abs Immature Granulocytes: 0.02 10*3/uL (ref 0.00–0.07)
Basophils Absolute: 0.1 10*3/uL (ref 0.0–0.1)
Basophils Relative: 1 %
Eosinophils Absolute: 0.2 10*3/uL (ref 0.0–0.5)
Eosinophils Relative: 3 %
HCT: 39 % (ref 36.0–46.0)
Hemoglobin: 13.1 g/dL (ref 12.0–15.0)
Immature Granulocytes: 0 %
Lymphocytes Relative: 31 %
Lymphs Abs: 2.1 10*3/uL (ref 0.7–4.0)
MCH: 29.2 pg (ref 26.0–34.0)
MCHC: 33.6 g/dL (ref 30.0–36.0)
MCV: 87.1 fL (ref 80.0–100.0)
Monocytes Absolute: 0.4 10*3/uL (ref 0.1–1.0)
Monocytes Relative: 5 %
Neutro Abs: 4 10*3/uL (ref 1.7–7.7)
Neutrophils Relative %: 60 %
Platelets: 301 10*3/uL (ref 150–400)
RBC: 4.48 MIL/uL (ref 3.87–5.11)
RDW: 12.2 % (ref 11.5–15.5)
WBC: 6.7 10*3/uL (ref 4.0–10.5)
nRBC: 0 % (ref 0.0–0.2)

## 2019-07-28 LAB — COMPREHENSIVE METABOLIC PANEL
ALT: 16 U/L (ref 0–44)
AST: 15 U/L (ref 15–41)
Albumin: 3.9 g/dL (ref 3.5–5.0)
Alkaline Phosphatase: 87 U/L (ref 38–126)
Anion gap: 7 (ref 5–15)
BUN: 9 mg/dL (ref 6–20)
CO2: 28 mmol/L (ref 22–32)
Calcium: 9 mg/dL (ref 8.9–10.3)
Chloride: 102 mmol/L (ref 98–111)
Creatinine, Ser: 0.84 mg/dL (ref 0.44–1.00)
GFR calc Af Amer: >60 mL/min (ref >60)
GFR calc non Af Amer: >60 mL/min (ref >60)
Glucose, Bld: 132 mg/dL — ABNORMAL HIGH (ref 70–99)
Potassium: 4 mmol/L (ref 3.5–5.1)
Sodium: 137 mmol/L (ref 135–145)
Total Bilirubin: 0.4 mg/dL (ref 0.3–1.2)
Total Protein: 7.6 g/dL (ref 6.5–8.1)

## 2019-07-28 NOTE — Assessment & Plan Note (Addendum)
#  Recurrent DVT/[name] [with acquired risk factors limited mobility-ankle surgery].  On indefinite anticoagulation.  On Eliquis.  STABLE.   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 STABLE.   # DISPOSITION: # follow up in 6 months- MD/labs- cbc/cmp-Dr.B   

## 2019-07-28 NOTE — Progress Notes (Signed)
[facility] NOTE  Patient Care Team: Venia Carbon, [name], 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; Superficial superficial vein- Left Medial LLE- started on Lovenox 86m BID; June 2017- On Eliquis BID  HISTORY OF PRESENTING ILLNESS:  Taylor VandemarkStrange 59y.o.  female above history of recurrent DVT; prior history of PE- currently on Eliquis.  Patient continues have chronic swelling of the lower extremities left more than right.  However denies any new shortness of breath or cough.  Denies any blood in stools or black or stools.   Review of Systems  Constitutional: Positive for malaise/fatigue. Negative for chills, diaphoresis, [name], hemoptysis, sputum production, [name], palpitations and orthopnea.  Gastrointestinal: Negative for abdominal pain, blood in stool, constipation, diarrhea, heartburn, melena, nausea and vomiting.  Genitourinary: Negative for dysuria, frequency and urgency.  Musculoskeletal: Negative for back pain.  Skin: Negative.  Negative for itching and rash.  Neurological: Negative for dizziness, tingling, focal weakness, weakness and headaches.  Endo/Heme/Allergies: Does not bruise/bleed easily.  Psychiatric/Behavioral: Negative for depression. The patient is  not nervous/anxious and does not have insomnia.      MEDICAL HISTORY:  Past Medical History:  Diagnosis Date  . Asymptomatic varicose veins   . Diffuse cystic mastopathy   . DVT ([place]) [place]   With BCP's  . Edema of both legs   . Hypothyroidism   . Venous insufficiency     SURGICAL HISTORY: Past Surgical History:  Procedure Laterality Date  . DILATION AND CURETTAGE OF UTERUS  ~2003  . LEG SURGERY  01/2015  . ORIF ANKLE FRACTURE Left 01/31/2015   Procedure: OPEN REDUCTION INTERNAL FIXATION (ORIF) DISTAL TIBIA  FRACTURE;  [name];  [name];  Service: Orthopedics;  Laterality: Left;  . PERIPHERAL VASCULAR CATHETERIZATION N/A 02/24/2015   Procedure: IVC Filter Insertion;  [name];  [facility];  Service: Cardiovascular;  [name];  . PERIPHERAL VASCULAR CATHETERIZATION N/A 06/20/2015   Procedure: IVC Filter Removal;  [name];  [facility];  Service: Cardiovascular;  [name];  . TUBAL LIGATION  2005  . vein closure Left 2008   Left Leg  . Vein Procedure     Dr. SJamal Collin   SOCIAL HISTORY: Social History   Socioeconomic History  . Marital status: Married    Spouse name: Not on file  . Number of children: 1  . Years of education: Not on file  . Highest education level: Not on file  Occupational History  . Occupation: OGlass blower/designer   Comment: BEducational psychologist . Occupation:      Comment:    Tobacco Use  . Smoking status: Never Smoker  . Smokeless tobacco: Never  Used  Substance and Sexual Activity  . Alcohol use: No  . Drug use: No  . Sexual activity: Yes  Other Topics Concern  . Not on file  Social History Narrative  . Not on file   Social Determinants of Health   Financial Resource Strain:   . Difficulty of Paying Living Expenses:   Food Insecurity:   . Worried About Charity fundraiser in the Last Year:   . Academic librarian in the Last Year:   Transportation Needs:   . Film/video editor (Medical):   Marland Kitchen Lack of Transportation (Non-Medical):   Physical Activity:   . Days of Exercise per Week:   . Minutes of Exercise per Session:   Stress:   . Feeling of Stress :   Social Connections:   . Frequency of Communication with Friends and Family:   . Frequency of Social Gatherings with Friends and Family:   . Attends Religious Services:   . Active Member of Clubs or Organizations:   . Attends Archivist Meetings:   Marland Kitchen Marital Status:   Intimate Partner Violence:   . Fear of Current or Ex-Partner:   . Emotionally Abused:   Marland Kitchen Physically Abused:   . Sexually Abused:     FAMILY HISTORY: Family History  Problem Relation Age of Onset  . Heart disease Mother        CHF  . Diabetes Mother   . Hypothyroidism Mother   . Cancer Maternal Aunt        Breast and Ovarian  (Believes BRCA positive)  . Cancer Other        Breast CA (Believes BRCA positive) (mom had br/ovar ca)  . Cancer Maternal Aunt        Melanoma  . Cancer Father        stomach    ALLERGIES:  is allergic to estrogens.  MEDICATIONS:  Current Outpatient Medications  Medication Sig Dispense Refill  . apixaban (ELIQUIS) 5 MG TABS tablet Take 1 tablet (5 mg total) by mouth 2 (two) times daily. 180 tablet 1  . cholecalciferol (VITAMIN D3) 25 MCG (1000 UNIT) tablet Take 1,000 Units by mouth daily.    Marland Kitchen SYNTHROID 112 MCG tablet Take 1 tablet (112 mcg total) by mouth daily before breakfast. Except take 1/2 tab on Sundays 90 tablet 0   No current facility-administered medications for this visit.      Marland Kitchen  PHYSICAL EXAMINATION:   Vitals:   07/28/19 1355  BP: 120/74  Pulse: 70  Temp: 98.4 F (36.9 C)   Filed Weights   07/28/19 1355  Weight: 183 lb 2 oz (83.1 kg)    Physical Exam  Constitutional: She is oriented to person, place, and time and well-developed, well-nourished, and in no distress.  HENT:  Head:  Normocephalic and atraumatic.  Mouth/Throat: Oropharynx is clear and moist. No oropharyngeal exudate.  Eyes: Pupils are equal, round, and reactive to light.  Cardiovascular: Normal rate and regular rhythm.  Pulmonary/Chest: Effort normal and breath sounds normal. No respiratory distress. She has no wheezes.  Abdominal: Soft. Bowel sounds are normal. She exhibits no distension and no mass. There is no abdominal tenderness. There is no rebound and no guarding.  Musculoskeletal:        General: No tenderness or edema. Normal range of motion.     Cervical back: Normal range of motion and neck supple.  Neurological: She is alert and oriented to person, place, and time.  Skin:  Skin is warm.  Psychiatric: Affect normal.    LABORATORY DATA:  I have reviewed the data as listed Lab Results  Component Value Date   WBC 6.7 07/28/2019   HGB 13.1 07/28/2019   HCT 39.0 07/28/2019   MCV 87.1 07/28/2019   PLT 301 07/28/2019   Recent Labs    01/16/19 1312 07/28/19 1310  NA 138 137  K 4.4 4.0  CL 105 102  CO2 27 28  GLUCOSE 138* 132*  BUN 11 9  CREATININE 0.84 0.84  CALCIUM 8.9 9.0  GFRNONAA >60 >60  GFRAA >60 >60  PROT 7.7 7.6  ALBUMIN 4.0 3.9  AST 19 15  ALT 14 16  ALKPHOS 91 87  BILITOT 0.4 0.4    RADIOGRAPHIC STUDIES: I have personally reviewed the radiological images as listed and agreed with the findings in the report. No results found.  ASSESSMENT & PLAN:   Leg DVT (deep venous thromboembolism), chronic, left (HCC) #Recurrent DVT/[name] [with acquired risk factors limited mobility-ankle surgery].  On indefinite anticoagulation.  On Eliquis.  STABLE.   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 STABLE.   # DISPOSITION: # follow up in 6 months- MD/labs- cbc/cmp-Dr.B     Cammie Sickle, MD 07/29/2019 8:12 AM

## 2019-10-02 ENCOUNTER — Other Ambulatory Visit: Payer: Self-pay | Admitting: Internal Medicine

## 2019-10-02 ENCOUNTER — Other Ambulatory Visit: Payer: Self-pay | Admitting: Obstetrics and Gynecology

## 2019-10-02 DIAGNOSIS — I82401 Acute embolism and thrombosis of unspecified deep veins of right lower extremity: Secondary | ICD-10-CM

## 2019-10-02 DIAGNOSIS — E039 Hypothyroidism, unspecified: Secondary | ICD-10-CM

## 2019-10-14 ENCOUNTER — Telehealth: Payer: Self-pay

## 2019-10-14 NOTE — Telephone Encounter (Signed)
Pt calling requesting refill on her synthroid. Is this okay to refill for pt?

## 2019-10-15 ENCOUNTER — Other Ambulatory Visit: Payer: Self-pay | Admitting: Obstetrics and Gynecology

## 2019-10-15 DIAGNOSIS — E039 Hypothyroidism, unspecified: Secondary | ICD-10-CM

## 2019-10-15 MED ORDER — SYNTHROID 112 MCG PO TABS: 112.0000 ug | ORAL_TABLET | Freq: Every day | ORAL | 0 refills | Status: DC

## 2019-10-15 NOTE — Telephone Encounter (Signed)
No, pt past due for labs. Order in system for thryoid, pt to make lab appt

## 2019-10-15 NOTE — Progress Notes (Signed)
Rx RF levo to [facility]. Pt never got repeat labs 5/21 after low TSH 4/21, and has been off pills for 3 days. Can't get filled at local pharm. Rx sent to [facility], get labs rechecked in 4 wks. Will f/u with results.

## 2019-10-15 NOTE — Telephone Encounter (Signed)
Pt aware she is due for labs. She thought she was just supposed to do it for every year. She is upset and not understanding why she has to come back. Requesting to speak with ABC. Would not listen to anything I had to say.

## 2019-10-15 NOTE — Telephone Encounter (Signed)
Explained to pt that TSH was too low 4/21 and she was due for thyroid recheck 4 wks later after med adjustment. Pt understands.

## 2019-12-03 ENCOUNTER — Ambulatory Visit
Admission: RE | Admit: 2019-12-03 | Discharge: 2019-12-03 | Disposition: A | Discharge status: Home / Self Care | Payer: BC Managed Care – PPO | Source: Ambulatory Visit | Attending: Obstetrics and Gynecology | Admitting: Obstetrics and Gynecology

## 2019-12-03 ENCOUNTER — Other Ambulatory Visit: Payer: Self-pay

## 2019-12-03 DIAGNOSIS — Z1231 Encounter for screening mammogram for malignant neoplasm of breast: Secondary | ICD-10-CM | POA: Diagnosis not present

## 2019-12-03 DIAGNOSIS — Z803 Family history of malignant neoplasm of breast: Secondary | ICD-10-CM | POA: Insufficient documentation

## 2019-12-10 ENCOUNTER — Inpatient Hospital Stay
Admission: RE | Admit: 2019-12-10 | Discharge: 2019-12-10 | Disposition: A | Discharge status: Home / Self Care | Payer: Self-pay | Source: Ambulatory Visit | Attending: *Deleted | Admitting: *Deleted

## 2019-12-10 ENCOUNTER — Encounter: Payer: Self-pay | Admitting: Obstetrics and Gynecology

## 2019-12-10 ENCOUNTER — Other Ambulatory Visit: Payer: Self-pay | Admitting: *Deleted

## 2019-12-10 DIAGNOSIS — Z1231 Encounter for screening mammogram for malignant neoplasm of breast: Secondary | ICD-10-CM

## 2019-12-17 NOTE — Progress Notes (Signed)
Venia Carbon, MD   Chief Complaint  Patient presents with  . Follow-up    thyroid labs    HPI:      [name] is a 59 y.o. G1P1001 whose LMP was No LMP recorded. (Menstrual status: Perimenopausal)., presents today for vulvar bx for persistent vaginal itching. Originally thought it was related to postmenopausal atrophy a few yrs ago, but pt can't have estrogens so no tx done. Sx have persisted over the years and are now suggestive of LS on exam. I recommended pt have bx at 1/21 appt but she deferred until now. Still having episodes of significant vag itching and some areas feel rough and swollen. No vag d/c or odor. No meds to treat.   Sister with recent stage 3 cx cancer dx. Pt with FH breast cancer but pt has declined cancer genetic testing several times. Is considering now and will f/u if desires. Current on pap, no hx of abn paps.   Pt also due for thyroid labs. Had med adjustment this spring, ran out of Rx in 8/21.    Past Medical History:  Diagnosis Date  . Asymptomatic varicose veins   . Diffuse cystic mastopathy   . DVT (deep venous thrombosis) ([place]) 2003   With BCP's  . Edema of both legs   . Hypothyroidism   . Venous insufficiency     Past Surgical History:  Procedure Laterality Date  . DILATION AND CURETTAGE OF UTERUS  ~2003  . LEG SURGERY  01/2015  . ORIF ANKLE FRACTURE Left 01/31/2015   Procedure: OPEN REDUCTION INTERNAL FIXATION (ORIF) DISTAL TIBIA  FRACTURE;  Surgeon: Corky Mull, MD;  [name];  Service: Orthopedics;  Laterality: Left;  . PERIPHERAL VASCULAR CATHETERIZATION N/A 02/24/2015   Procedure: IVC Filter Insertion;  Surgeon: Algernon Huxley, MD;  Location: [facility];  Service: Cardiovascular;  [name];  . PERIPHERAL VASCULAR CATHETERIZATION N/A 06/20/2015   Procedure: IVC Filter Removal;  Surgeon: Algernon Huxley, MD;  Location: [facility];  Service: Cardiovascular;  [name];  . TUBAL LIGATION   2005  . vein closure Left 2008   Left Leg  . Vein Procedure     Dr. Jamal Collin    Family History  Problem Relation Age of Onset  . Heart disease Mother        CHF  . Diabetes Mother   . Hypothyroidism Mother   . Cancer Maternal Aunt        Breast and Ovarian  (Believes BRCA positive)  . Cancer Other        Breast CA (Believes BRCA positive) (mom had br/ovar ca)  . Cancer Maternal Aunt        Melanoma  . Cervical cancer Sister        stage 3  . Cancer Father        stomach    Social History   Socioeconomic History  . Marital status: Married    Spouse name: Not on file  . Number of children: 1  . Years of education: Not on file  . Highest education level: Not on file  Occupational History  . Occupation: Glass blower/designer    Comment: Educational psychologist  . Occupation:      Comment:    Tobacco Use  . Smoking status: Never Smoker  . Smokeless tobacco: Never Used  Vaping Use  . Vaping Use: Never used  Substance and Sexual Activity  . Alcohol use:  No  . Drug use: No  . Sexual activity: Yes  Other Topics Concern  . Not on file  Social History Narrative  . Not on file   Social Determinants of Health   Financial Resource Strain:   . Difficulty of Paying Living Expenses: Not on file  Food Insecurity:   . Worried About Charity fundraiser in the Last Year: Not on file  . Ran Out of Food in the Last Year: Not on file  Transportation Needs:   . Lack of Transportation (Medical): Not on file  . Lack of Transportation (Non-Medical): Not on file  Physical Activity:   . Days of Exercise per Week: Not on file  . Minutes of Exercise per Session: Not on file  Stress:   . Feeling of Stress : Not on file  Social Connections:   . Frequency of Communication with Friends and Family: Not on file  . Frequency of Social Gatherings with Friends and Family: Not on file  . Attends Religious Services: Not on file  . Active Member of Clubs or Organizations: Not on file  . Attends English as a second language teacher Meetings: Not on file  . Marital Status: Not on file  Intimate Partner Violence:   . Fear of Current or Ex-Partner: Not on file  . Emotionally Abused: Not on file  . Physically Abused: Not on file  . Sexually Abused: Not on file    Outpatient Medications Prior to Visit  Medication Sig Dispense Refill  . cholecalciferol (VITAMIN D3) 25 MCG (1000 UNIT) tablet Take 1,000 Units by mouth daily.    Marland Kitchen ELIQUIS 5 MG TABS tablet TAKE 1 TABLET TWICE A DAY 180 tablet 3  . SYNTHROID 112 MCG tablet Take 1 tablet (112 mcg total) by mouth daily before breakfast. Except take 1/2 tab on Sundays 90 tablet 0   No facility-administered medications prior to visit.      ROS:  Review of Systems  Constitutional: Negative for fever.  Gastrointestinal: Negative for blood in stool, constipation, diarrhea, nausea and vomiting.  Genitourinary: Negative for dyspareunia, dysuria, flank pain, frequency, hematuria, urgency, vaginal bleeding, vaginal discharge and vaginal pain.  Musculoskeletal: Negative for back pain.  Skin: Negative for rash.   BREAST: No symptoms   OBJECTIVE:   Vitals:  BP 100/70   Ht $R'5\' 3"'Oc$  (1.6 m)   Wt 184 lb (83.5 kg)   BMI 32.59 kg/m   Physical Exam Vitals reviewed.  Constitutional:      Appearance: She is well-developed.  Pulmonary:     Effort: Pulmonary effort is normal.  Genitourinary:      Comments: AREAS OF PALE SKIN WITH HYPERTROPHY PERIANAL AND PERINEAL AREAS, LT LABIA > RT LABIA Musculoskeletal:        General: Normal range of motion.     Cervical back: Normal range of motion.  Skin:    General: Skin is warm and dry.  Neurological:     General: No focal deficit present.     Mental Status: She is alert and oriented to person, place, and time.     Cranial Nerves: No cranial nerve deficit.  Psychiatric:        Mood and Affect: Mood normal.        Behavior: Behavior normal.        Thought Content: Thought content normal.        Judgment:  Judgment normal.    VULVAR BIOPSY NOTE The indications for vulvar biopsy (rule out neoplasia, establish lichen sclerosus diagnosis)  were reviewed.   Risks of the biopsy were discussed. The patient stated understanding and agreed to undergo procedure today.   The patient's vulva was prepped with Betadine. 1% lidocaine was injected into area of concern. A 3 -mm punch biopsy was done, biopsy tissue was picked up with sterile forceps and sterile scissors were used to excise the lesion.  Small bleeding was noted and hemostasis was achieved using silver nitrate sticks.  The patient tolerated the procedure well. Post-procedure instructions  (pelvic rest for one week) were given to the patient. The patient is to call with heavy bleeding, fever greater than 100.4, foul smelling vaginal discharge or other concerns.   Assessment/Plan: Vaginal itching - Plan: Surgical pathology; Vulvar bx LT labia majora, rule out LS. If pos, will do clobetasol tx. Will f/u with results.   Lichen sclerosus  Hypothyroidism, unspecified type - Plan: TSH + free T4; lab repeat today. Will f/u with results and refill levo based on labs.   Leg DVT (deep venous thromboembolism), chronic, left (HCC) - Plan: CBC with Differential, Comprehensive metabolic panel; labs in system from another provider. Checked today with blood draw.     Return if symptoms worsen or fail to improve.  Willie Plain B. Remmington Teters, PA-C 12/18/2019 3:30 PM

## 2019-12-17 NOTE — Patient Instructions (Signed)
I value your feedback and entrusting us with your care. If you get a [facility] patient survey, I would appreciate you taking the time to let us know about your experience today. Thank you!  As of February 12, 2019, your lab results will be released to your MyChart immediately, before I even have a chance to see them. Please give me time to review them and contact you if there are any abnormalities. Thank you for your patience.  

## 2019-12-18 ENCOUNTER — Encounter: Payer: Self-pay | Admitting: Obstetrics and Gynecology

## 2019-12-18 ENCOUNTER — Other Ambulatory Visit: Payer: Self-pay

## 2019-12-18 ENCOUNTER — Ambulatory Visit (INDEPENDENT_AMBULATORY_CARE_PROVIDER_SITE_OTHER): Payer: BC Managed Care – PPO | Admitting: Obstetrics and Gynecology

## 2019-12-18 ENCOUNTER — Other Ambulatory Visit (HOSPITAL_COMMUNITY)
Admission: RE | Admit: 2019-12-18 | Discharge: 2019-12-18 | Disposition: A | Discharge status: Home / Self Care | Payer: BC Managed Care – PPO | Source: Ambulatory Visit | Attending: Obstetrics and Gynecology | Admitting: Obstetrics and Gynecology

## 2019-12-18 VITALS — BP 100/70 | Ht 63.0 in | Wt 184.0 lb

## 2019-12-18 DIAGNOSIS — N898 Other specified noninflammatory disorders of vagina: Secondary | ICD-10-CM | POA: Diagnosis not present

## 2019-12-18 DIAGNOSIS — L900 Lichen sclerosus et atrophicus: Secondary | ICD-10-CM | POA: Diagnosis not present

## 2019-12-18 DIAGNOSIS — E039 Hypothyroidism, unspecified: Secondary | ICD-10-CM

## 2019-12-18 DIAGNOSIS — I82502 Chronic embolism and thrombosis of unspecified deep veins of left lower extremity: Secondary | ICD-10-CM | POA: Diagnosis not present

## 2019-12-19 LAB — TSH+FREE T4
Free T4: 1.68 ng/dL (ref 0.82–1.77)
TSH: 1.36 u[IU]/mL (ref 0.450–4.500)

## 2019-12-20 ENCOUNTER — Other Ambulatory Visit: Payer: Self-pay | Admitting: Obstetrics and Gynecology

## 2019-12-20 DIAGNOSIS — E039 Hypothyroidism, unspecified: Secondary | ICD-10-CM

## 2019-12-20 MED ORDER — SYNTHROID 112 MCG PO TABS: 112.0000 ug | ORAL_TABLET | Freq: Every day | ORAL | 1 refills | Status: DC

## 2019-12-20 NOTE — Progress Notes (Signed)
Pls let pt know thyroid labs normal. Rx RF eRxd to mail order. Recheck in 6 months. Thx

## 2019-12-20 NOTE — Progress Notes (Signed)
Rx RF levo 112 mcg

## 2019-12-21 ENCOUNTER — Telehealth: Payer: Self-pay | Admitting: Obstetrics and Gynecology

## 2019-12-21 DIAGNOSIS — L900 Lichen sclerosus et atrophicus: Secondary | ICD-10-CM | POA: Insufficient documentation

## 2019-12-21 LAB — SURGICAL PATHOLOGY

## 2019-12-21 MED ORDER — CLOBETASOL PROPIONATE 0.05 % EX OINT: TOPICAL_OINTMENT | CUTANEOUS | 0 refills | Status: DC

## 2019-12-21 NOTE — Telephone Encounter (Signed)
LM with vulvar bx results, c/w LS. Rx clobetasol to mail order. F/u at 1/22 annual.

## 2019-12-21 NOTE — Progress Notes (Signed)
Pt aware.

## 2020-01-25 ENCOUNTER — Other Ambulatory Visit: Payer: Self-pay

## 2020-01-25 DIAGNOSIS — I82502 Chronic embolism and thrombosis of unspecified deep veins of left lower extremity: Secondary | ICD-10-CM

## 2020-01-26 ENCOUNTER — Inpatient Hospital Stay: Payer: BC Managed Care – PPO

## 2020-01-26 ENCOUNTER — Inpatient Hospital Stay: Payer: BC Managed Care – PPO | Admitting: Internal Medicine

## 2020-02-02 ENCOUNTER — Encounter: Payer: Self-pay | Admitting: Internal Medicine

## 2020-02-02 ENCOUNTER — Inpatient Hospital Stay (HOSPITAL_BASED_OUTPATIENT_CLINIC_OR_DEPARTMENT_OTHER): Payer: BC Managed Care – PPO | Admitting: Internal Medicine

## 2020-02-02 ENCOUNTER — Other Ambulatory Visit: Payer: Self-pay

## 2020-02-02 ENCOUNTER — Inpatient Hospital Stay: Payer: BC Managed Care – PPO | Attending: Internal Medicine

## 2020-02-02 DIAGNOSIS — I82502 Chronic embolism and thrombosis of unspecified deep veins of left lower extremity: Secondary | ICD-10-CM

## 2020-02-02 DIAGNOSIS — Z86711 Personal history of pulmonary embolism: Secondary | ICD-10-CM | POA: Diagnosis not present

## 2020-02-02 DIAGNOSIS — M7989 Other specified soft tissue disorders: Secondary | ICD-10-CM | POA: Insufficient documentation

## 2020-02-02 DIAGNOSIS — Z7901 Long term (current) use of anticoagulants: Secondary | ICD-10-CM | POA: Diagnosis not present

## 2020-02-02 LAB — COMPREHENSIVE METABOLIC PANEL
ALT: 14 U/L (ref 0–44)
AST: 17 U/L (ref 15–41)
Albumin: 4.2 g/dL (ref 3.5–5.0)
Alkaline Phosphatase: 79 U/L (ref 38–126)
Anion gap: 5 (ref 5–15)
BUN: 11 mg/dL (ref 6–20)
CO2: 31 mmol/L (ref 22–32)
Calcium: 9.2 mg/dL (ref 8.9–10.3)
Chloride: 102 mmol/L (ref 98–111)
Creatinine, Ser: 0.72 mg/dL (ref 0.44–1.00)
GFR, Estimated: >60 mL/min (ref >60)
Glucose, Bld: 119 mg/dL — ABNORMAL HIGH (ref 70–99)
Potassium: 3.7 mmol/L (ref 3.5–5.1)
Sodium: 138 mmol/L (ref 135–145)
Total Bilirubin: 0.5 mg/dL (ref 0.3–1.2)
Total Protein: 7.7 g/dL (ref 6.5–8.1)

## 2020-02-02 LAB — CBC WITH DIFFERENTIAL/PLATELET
Abs Immature Granulocytes: 0.01 10*3/uL (ref 0.00–0.07)
Basophils Absolute: 0.1 10*3/uL (ref 0.0–0.1)
Basophils Relative: 1 %
Eosinophils Absolute: 0.1 10*3/uL (ref 0.0–0.5)
Eosinophils Relative: 1 %
HCT: 41.3 % (ref 36.0–46.0)
Hemoglobin: 13.5 g/dL (ref 12.0–15.0)
Immature Granulocytes: 0 %
Lymphocytes Relative: 33 %
Lymphs Abs: 2.1 10*3/uL (ref 0.7–4.0)
MCH: 29.1 pg (ref 26.0–34.0)
MCHC: 32.7 g/dL (ref 30.0–36.0)
MCV: 89 fL (ref 80.0–100.0)
Monocytes Absolute: 0.3 10*3/uL (ref 0.1–1.0)
Monocytes Relative: 5 %
Neutro Abs: 3.7 10*3/uL (ref 1.7–7.7)
Neutrophils Relative %: 60 %
Platelets: 359 10*3/uL (ref 150–400)
RBC: 4.64 MIL/uL (ref 3.87–5.11)
RDW: 13 % (ref 11.5–15.5)
WBC: 6.3 10*3/uL (ref 4.0–10.5)
nRBC: 0 % (ref 0.0–0.2)

## 2020-02-02 NOTE — Assessment & Plan Note (Addendum)
#  Recurrent DVT/[name] [with acquired risk factors limited mobility-ankle surgery].  On indefinite anticoagulation.  On Eliquis.  STABLE.   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 STABLE.   # DISPOSITION: # follow up in 6 months- MD/labs- cbc/cmp-Dr.B

## 2020-02-02 NOTE — Progress Notes (Signed)
[facility] NOTE  Patient Care Team: Venia Carbon, [name], 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; Superficial superficial vein- Left Medial LLE- started on Lovenox 62m BID; June 2017- On Eliquis BID  HISTORY OF PRESENTING ILLNESS:  Taylor RepettoStrange 59y.o.  female above history of recurrent DVT; prior history of PE- currently on Eliquis.  Patient states to be compliant with her Eliquis.  No blood in stools or black-colored stools.  No new shortness of breath or cough.  She has chronic leg swelling left more than right.  However this also improved by using the compression stockings.   Review of Systems  Constitutional: Positive for malaise/fatigue. Negative for chills, diaphoresis, [name], hemoptysis, sputum production, [name], palpitations and orthopnea.  Gastrointestinal: Negative for abdominal pain, blood in stool, constipation, diarrhea, heartburn, melena, nausea and vomiting.  Genitourinary: Negative for dysuria, frequency and urgency.  Musculoskeletal: Negative for back pain.  Skin: Negative.  Negative for itching and rash.  Neurological: Negative for dizziness, tingling, focal weakness, weakness and headaches.  Endo/Heme/Allergies: Does not bruise/bleed easily.   Psychiatric/Behavioral: Negative for depression. The patient is not nervous/anxious and does not have insomnia.      MEDICAL HISTORY:  Past Medical History:  Diagnosis Date  . Asymptomatic varicose veins   . Diffuse cystic mastopathy   . DVT (deep venous thrombosis) ([place] 2003   With BCP's  . Edema of both legs   . Hypothyroidism   . Venous insufficiency     SURGICAL HISTORY: Past Surgical History:  Procedure Laterality Date  . DILATION AND CURETTAGE OF UTERUS  ~2003  . LEG SURGERY  01/2015  . ORIF ANKLE FRACTURE Left 01/31/2015   Procedure: OPEN REDUCTION INTERNAL FIXATION (ORIF) DISTAL TIBIA  FRACTURE;  [name];  [name];  Service: Orthopedics;  Laterality: Left;  . PERIPHERAL VASCULAR CATHETERIZATION N/A 02/24/2015   Procedure: IVC Filter Insertion;  [name];  [facility];  Service: Cardiovascular;  [name];  . PERIPHERAL VASCULAR CATHETERIZATION N/A 06/20/2015   Procedure: IVC Filter Removal;  [name];  [facility];  Service: Cardiovascular;  [name];  . TUBAL LIGATION  2005  . vein closure Left 2008   Left Leg  . Vein Procedure     Dr. SJamal Collin   SOCIAL HISTORY: Social History   Socioeconomic History  . Marital status: Married    Spouse name: Not on file  . Number of children: 1  . Years of education: Not on file  . Highest education level: Not on file  Occupational History  . Occupation: OGlass blower/designer   Comment: BEducational psychologist . Occupation:      Comment:    Tobacco Use  .  Smoking status: Never Smoker  . Smokeless tobacco: Never Used  Vaping Use  . Vaping Use: Never used  Substance and Sexual Activity  . Alcohol use: No  . Drug use: No  . Sexual activity: Yes  Other Topics Concern  . Not on file  Social History Narrative  . Not on file   Social Determinants of Health   Financial Resource Strain:   . Difficulty of Paying  Living Expenses: Not on file  Food Insecurity:   . Worried About Charity fundraiser in the Last Year: Not on file  . Ran Out of Food in the Last Year: Not on file  Transportation Needs:   . Lack of Transportation (Medical): Not on file  . Lack of Transportation (Non-Medical): Not on file  Physical Activity:   . Days of Exercise per Week: Not on file  . Minutes of Exercise per Session: Not on file  Stress:   . Feeling of Stress : Not on file  Social Connections:   . Frequency of Communication with Friends and Family: Not on file  . Frequency of Social Gatherings with Friends and Family: Not on file  . Attends Religious Services: Not on file  . Active Member of Clubs or Organizations: Not on file  . Attends Archivist Meetings: Not on file  . Marital Status: Not on file  Intimate Partner Violence:   . Fear of Current or Ex-Partner: Not on file  . Emotionally Abused: Not on file  . Physically Abused: Not on file  . Sexually Abused: Not on file    FAMILY HISTORY: Family History  Problem Relation Age of Onset  . Heart disease Mother        CHF  . Diabetes Mother   . Hypothyroidism Mother   . Cancer Maternal Aunt        Breast and Ovarian  (Believes BRCA positive)  . Cancer Other        Breast CA (Believes BRCA positive) (mom had br/ovar ca)  . Cancer Maternal Aunt        Melanoma  . Cervical cancer Sister        stage 3  . Cancer Father        stomach    ALLERGIES:  is allergic to estrogens.  MEDICATIONS:  Current Outpatient Medications  Medication Sig Dispense Refill  . cholecalciferol (VITAMIN D3) 25 MCG (1000 UNIT) tablet Take 1,000 Units by mouth daily.    Marland Kitchen ELIQUIS 5 MG TABS tablet TAKE 1 TABLET TWICE A DAY 180 tablet 3  . SYNTHROID 112 MCG tablet Take 1 tablet (112 mcg total) by mouth daily before breakfast. Except take 1/2 tab on Sundays 90 tablet 1   No current facility-administered medications for this visit.      Marland Kitchen  PHYSICAL  EXAMINATION:   Vitals:   02/02/20 1307  BP: (!) 117/57  Pulse: 69  Resp: 16  Temp: 97.8 F (36.6 C)  SpO2: 100%   Filed Weights   02/02/20 1307  Weight: 186 lb 6.4 oz (84.6 kg)    Physical Exam HENT:     Head: Normocephalic and atraumatic.     Mouth/Throat:     Pharynx: No oropharyngeal exudate.  Eyes:     Pupils: Pupils are equal, round, and reactive to light.  Cardiovascular:     Rate and Rhythm: Normal rate and regular rhythm.  Pulmonary:     Effort: Pulmonary effort is normal. No respiratory distress.  Breath sounds: Normal breath sounds. No wheezing.  Abdominal:     General: Bowel sounds are normal. There is no distension.     Palpations: Abdomen is soft. There is no mass.     Tenderness: There is no abdominal tenderness. There is no guarding or rebound.  Musculoskeletal:        General: No tenderness. Normal range of motion.     Cervical back: Normal range of motion and neck supple.  Skin:    General: Skin is warm.  Neurological:     Mental Status: She is alert and oriented to person, place, and time.  Psychiatric:        Mood and Affect: Affect normal.     LABORATORY DATA:  I have reviewed the data as listed Lab Results  Component Value Date   WBC 6.3 02/02/2020   HGB 13.5 02/02/2020   HCT 41.3 02/02/2020   MCV 89.0 02/02/2020   PLT 359 02/02/2020   Recent Labs    07/28/19 1310 02/02/20 1254  NA 137 138  K 4.0 3.7  CL 102 102  CO2 28 31  GLUCOSE 132* 119*  BUN 9 11  CREATININE 0.84 0.72  CALCIUM 9.0 9.2  GFRNONAA >60 >60  GFRAA >60  --   PROT 7.6 7.7  ALBUMIN 3.9 4.2  AST 15 17  ALT 16 14  ALKPHOS 87 79  BILITOT 0.4 0.5    RADIOGRAPHIC STUDIES: I have personally reviewed the radiological images as listed and agreed with the findings in the report. No results found.  ASSESSMENT & PLAN:   Leg DVT (deep venous thromboembolism), chronic, left (HCC) #Recurrent DVT/[name] [with acquired risk factors limited  mobility-ankle surgery].  On indefinite anticoagulation.  On Eliquis.  STABLE.   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 STABLE.   # DISPOSITION: # follow up in 6 months- MD/labs- cbc/cmp-Dr.B     Cammie Sickle, MD 02/02/2020 3:05 PM

## 2020-03-18 LAB — COLOGUARD

## 2020-05-10 ENCOUNTER — Telehealth: Payer: Self-pay

## 2020-05-10 NOTE — Telephone Encounter (Signed)
LMTRC. Had been on brand only. Just denied Rx change to [facility] this AM. Does pt want locally or to mail order. Thyroid labs due 4/22 anyway

## 2020-05-10 NOTE — Telephone Encounter (Signed)
Pt calling; synthroid is no longer covered by her ins; needs the one refill covered called in to [facility] at [facility]; wants to speak c ABC.  [phone]

## 2020-05-12 ENCOUNTER — Other Ambulatory Visit: Payer: Self-pay | Admitting: Obstetrics and Gynecology

## 2020-05-12 DIAGNOSIS — E039 Hypothyroidism, unspecified: Secondary | ICD-10-CM

## 2020-05-12 MED ORDER — SYNTHROID 112 MCG PO TABS: 112.0000 ug | ORAL_TABLET | Freq: Every day | ORAL | 0 refills | Status: DC

## 2020-05-12 NOTE — Progress Notes (Signed)
Rx RF to [facility]. Pt on brand only and insurance won't cover, can't get through [facility] if wants brand. Will do Prior Auth. Pt had fluctuating thyroid levels on generic in past. Only wants brand, even if has to pay more out of pocket.

## 2020-05-12 NOTE — Telephone Encounter (Signed)
I sent BRAND Rx to [facility]. Pls do prior auth for brand only. Pt had fluctuating thyroid levels on generic in past. Let me know of any questions. Thx.

## 2020-05-12 NOTE — Telephone Encounter (Signed)
Pt calling; has question about her synthroid rx.  [phone]

## 2020-05-13 NOTE — Telephone Encounter (Signed)
Called pharmacy was told Rx sent is covered by insurance with copay of $39.50, pt aware of it and will call pharmacy. Says pharmacy told her they would call her back. Pt will f/u with [place] if she needs [place] to submit PA since this is cheaper than what she was paying to [employer].

## 2020-07-25 ENCOUNTER — Inpatient Hospital Stay: Payer: BC Managed Care – PPO

## 2020-07-25 ENCOUNTER — Encounter: Payer: Self-pay | Admitting: Internal Medicine

## 2020-07-25 ENCOUNTER — Inpatient Hospital Stay: Payer: BC Managed Care – PPO | Attending: Internal Medicine | Admitting: Internal Medicine

## 2020-07-25 ENCOUNTER — Other Ambulatory Visit: Payer: Self-pay

## 2020-07-25 DIAGNOSIS — E669 Obesity, unspecified: Secondary | ICD-10-CM | POA: Insufficient documentation

## 2020-07-25 DIAGNOSIS — I82502 Chronic embolism and thrombosis of unspecified deep veins of left lower extremity: Secondary | ICD-10-CM | POA: Diagnosis not present

## 2020-07-25 DIAGNOSIS — Z7901 Long term (current) use of anticoagulants: Secondary | ICD-10-CM | POA: Insufficient documentation

## 2020-07-25 DIAGNOSIS — Z86711 Personal history of pulmonary embolism: Secondary | ICD-10-CM | POA: Diagnosis not present

## 2020-07-25 DIAGNOSIS — M7989 Other specified soft tissue disorders: Secondary | ICD-10-CM | POA: Insufficient documentation

## 2020-07-25 LAB — CBC WITH DIFFERENTIAL/PLATELET
Abs Immature Granulocytes: 0.03 10*3/uL (ref 0.00–0.07)
Basophils Absolute: 0.1 10*3/uL (ref 0.0–0.1)
Basophils Relative: 1 %
Eosinophils Absolute: 0.1 10*3/uL (ref 0.0–0.5)
Eosinophils Relative: 1 %
HCT: 42.1 % (ref 36.0–46.0)
Hemoglobin: 13.9 g/dL (ref 12.0–15.0)
Immature Granulocytes: 0 %
Lymphocytes Relative: 30 %
Lymphs Abs: 2.3 10*3/uL (ref 0.7–4.0)
MCH: 29.1 pg (ref 26.0–34.0)
MCHC: 33 g/dL (ref 30.0–36.0)
MCV: 88.3 fL (ref 80.0–100.0)
Monocytes Absolute: 0.5 10*3/uL (ref 0.1–1.0)
Monocytes Relative: 6 %
Neutro Abs: 4.6 10*3/uL (ref 1.7–7.7)
Neutrophils Relative %: 62 %
Platelets: 376 10*3/uL (ref 150–400)
RBC: 4.77 MIL/uL (ref 3.87–5.11)
RDW: 12.8 % (ref 11.5–15.5)
WBC: 7.6 10*3/uL (ref 4.0–10.5)
nRBC: 0 % (ref 0.0–0.2)

## 2020-07-25 LAB — COMPREHENSIVE METABOLIC PANEL
ALT: 14 U/L (ref 0–44)
AST: 16 U/L (ref 15–41)
Albumin: 4 g/dL (ref 3.5–5.0)
Alkaline Phosphatase: 95 U/L (ref 38–126)
Anion gap: 9 (ref 5–15)
BUN: 12 mg/dL (ref 6–20)
CO2: 28 mmol/L (ref 22–32)
Calcium: 9.1 mg/dL (ref 8.9–10.3)
Chloride: 101 mmol/L (ref 98–111)
Creatinine, Ser: 0.83 mg/dL (ref 0.44–1.00)
GFR, Estimated: >60 mL/min (ref >60)
Glucose, Bld: 114 mg/dL — ABNORMAL HIGH (ref 70–99)
Potassium: 3.9 mmol/L (ref 3.5–5.1)
Sodium: 138 mmol/L (ref 135–145)
Total Bilirubin: 0.5 mg/dL (ref 0.3–1.2)
Total Protein: 7.7 g/dL (ref 6.5–8.1)

## 2020-07-25 NOTE — Progress Notes (Signed)
[name], [name], 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; [name]; [name]; [name]; [name], diaphoresis, [name], hemoptysis, sputum production, [name], palpitations and orthopnea.  Gastrointestinal: Negative for abdominal pain, blood in stool, constipation, diarrhea, heartburn, melena, nausea and vomiting.  Genitourinary: Negative for dysuria, frequency and urgency.  Musculoskeletal: Negative for back pain.  Skin: Negative.  Negative for itching and rash.  Neurological: Negative for dizziness, tingling, focal weakness, weakness and headaches.  Endo/Heme/Allergies: Does not bruise/bleed easily.   Psychiatric/Behavioral: Negative for depression. The patient is not nervous/anxious and does not have insomnia.      MEDICAL HISTORY:  Past Medical History:  Diagnosis Date  . Asymptomatic varicose veins   . Diffuse cystic mastopathy   . DVT (deep venous thrombosis) ([facility] 2003   With BCP's  . Edema of both legs   . Hypothyroidism   . Venous insufficiency     SURGICAL HISTORY: Past Surgical History:  Procedure Laterality Date  . DILATION AND CURETTAGE OF UTERUS  ~2003  . LEG SURGERY  01/2015  . ORIF ANKLE FRACTURE Left 01/31/2015   Procedure: OPEN REDUCTION INTERNAL FIXATION (ORIF) DISTAL TIBIA  FRACTURE;  [name];  [name];  Service: Orthopedics;  Laterality: Left;  . PERIPHERAL VASCULAR CATHETERIZATION N/A 02/24/2015   Procedure: IVC Filter Insertion;  [name];  [name];  Service: Cardiovascular;  [name];  . PERIPHERAL VASCULAR CATHETERIZATION N/A 06/20/2015   Procedure: IVC Filter Removal;  [name];  [name];  Service: Cardiovascular;  [name];  [name],000 Units by mouth daily.    Marland Kitchen ELIQUIS 5 MG TABS tablet TAKE 1 TABLET TWICE A DAY 180 tablet 3  . SYNTHROID 112 MCG tablet Take 1 tablet (112 mcg total) by mouth daily before breakfast. Except take 1/2 tab on Sundays 90 tablet 0   No current facility-administered medications for this visit.      Marland Kitchen  PHYSICAL EXAMINATION:   Vitals:   07/25/20 1313  BP: 116/75  Pulse: 72  Resp: 16  Temp: 98.6 F (37 C)  SpO2: 98%   Filed Weights   07/25/20 1313  Weight: 187 lb 9.6 oz (85.1 kg)    Physical Exam HENT:     Head: Normocephalic and atraumatic.     Mouth/Throat:     Pharynx: No oropharyngeal exudate.  Eyes:     Pupils: Pupils are equal, round, [name], place, and time.  Psychiatric:        Mood and Affect: Affect normal.     LABORATORY DATA:  I have reviewed the data as listed Lab Results  Component Value Date   WBC 7.6 07/25/2020   HGB 13.9 07/25/2020   HCT 42.1 07/25/2020   MCV 88.3 07/25/2020   PLT 376 07/25/2020  Recent Labs    07/28/19 1310 02/02/20 1254 07/25/20 1250  NA 137 138 138  K 4.0 3.7 3.9  CL 102 102 101  CO2 28 31 28   GLUCOSE 132* 119* 114*  BUN 9 11 12   CREATININE 0.84 0.72 0.83  CALCIUM 9.0 9.2 9.1  GFRNONAA >60 >60 >60  GFRAA >60  --   --   PROT 7.6 7.7 7.7  ALBUMIN 3.9 4.2 4.0  AST 15 17 16   ALT 16 14 14   ALKPHOS 87 79 95  BILITOT 0.4 0.5 0.5    RADIOGRAPHIC STUDIES: I have personally reviewed the radiological images as listed and agreed with the findings in the report. No results found.  ASSESSMENT & PLAN:   Leg DVT (deep venous thromboembolism), chronic, left (HCC) #Recurrent DVT/[name] [with acquired risk factors limited mobility-ankle surgery].  On indefinite anticoagulation.  On Eliquis.  STABLE.   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   #Obesity/weight gain-unable to lose weight.  Recommend follow-up with PCP regarding weight loss interventions.  # DISPOSITION: # follow up in 6 months- MD/labs- cbc/cmp-Dr.B     Cammie Sickle, MD 07/25/2020 1:57 PM

## 2020-07-25 NOTE — Assessment & Plan Note (Addendum)
#  Recurrent DVT/[name] [with acquired risk factors limited mobility-ankle surgery].  On indefinite anticoagulation.  On Eliquis.  STABLE.   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 .   #Obesity/weight gain-unable to lose weight.  Recommend follow-up with PCP regarding weight loss interventions.  # DISPOSITION: # follow up in 6 months- MD/labs- cbc/cmp-Dr.B   

## 2020-10-12 ENCOUNTER — Other Ambulatory Visit: Payer: Self-pay | Admitting: Obstetrics and Gynecology

## 2020-10-12 DIAGNOSIS — E039 Hypothyroidism, unspecified: Secondary | ICD-10-CM

## 2020-10-18 ENCOUNTER — Telehealth: Payer: Self-pay

## 2020-10-18 NOTE — Telephone Encounter (Signed)
Called [facility] and spoke to [place], she pulled 30 tablets [facility] for $39.50. Pt aware and will get these in the meantime we hear from PA. Im working on it.

## 2020-10-18 NOTE — Telephone Encounter (Signed)
Pls f/u with pt since you're working on this PA. Thx.

## 2020-10-18 NOTE — Telephone Encounter (Signed)
Pt calling; appt scheduled 10/11th; needs synthroid refill; [employer] is not covering name brand; can a PA be done so they will cover it?  [phone]

## 2020-10-25 NOTE — Telephone Encounter (Signed)
PA FINALLY submitted. Wrong 800 numbers were given until I called pt's local pharmacy again to request a new number. We should have PA response by 10/27/20.

## 2020-11-02 NOTE — Telephone Encounter (Signed)
Pt aware.

## 2020-11-02 NOTE — Telephone Encounter (Signed)
Called pt to let her know PA APPROVED from 08/27/20-10/26/21, no answer, [facility]. Approval letter in pile to be scanned.

## 2020-12-08 DIAGNOSIS — Z803 Family history of malignant neoplasm of breast: Secondary | ICD-10-CM | POA: Insufficient documentation

## 2020-12-08 NOTE — Progress Notes (Addendum)
PCP: Venia Carbon, MD   Chief Complaint  Patient presents with   Gynecologic Exam    No concerns    HPI:      [name] is a 60 y.o. G1P1 who LMP was Patient's last menstrual period was 08/16/2014., presents today for her annual examination.  Her menses are absent due to menopause. She does not have PMB. No vasomotor sx.   Sex activity: single partner, contraception - post menopausal status. She does have vaginal dryness and sex is impossible. She has vag areas that are painful and itchy at times, and then resolve. They really flare with sex as well. She has tried coconut oil without much relief. SHE CAN'T HAVE ESTROGENS DUE TO HX OF 2 DVTs. Pt is on blood thinners and thinks she is Factor V Leiden positive.   LS confirmed on bx 10/21 with worsening vag sx. Pt started on clobetasol oint. Sx improved with tx but pt hasn't used cream in about 6 months. No side effects, just forgets to use it. Sex improved when was treating with oin.   Last Pap: Jul 21, 2015  Results were: no abnormalities /neg HPV DNA. No hx of abn paps. Hx of STDs: none  Last mammogram: 12/03/19  Results were: normal--routine follow-up in 12 months. Had been followed by Dr. Jamal Collin.  There is a FH of breast cancer in her mat aunt, who also had ovar ca, and a mat cousin. She has a 2nd aunt with melanoma. Pt's 2nd cousin did genetic testing and she thinks she was "positive". Pt has never had testing done/declined past fews yr. The patient does do self-breast exams.  Colonoscopy: never; scared of procedure. Willing to do cologuard last yr but not done. Pt considering colonoscopy and would like to research where to go.  Tobacco use: The patient denies current or previous tobacco use. Alcohol use: none  No drug use Exercise: mod active   She does not get adequate calcium but does take Vitamin D  Has hypothyroidism and was euthyroid 10/21 labs. On brand Synthroid 112 mcg daily except 1/2 tab on Sun. Due  for labs. No recent fasting labs but not fasting today.  Past Medical History:  Diagnosis Date   Asymptomatic varicose veins    Diffuse cystic mastopathy    DVT (deep venous thrombosis) ([place]) 2003   With BCP's   Edema of both legs    Hypothyroidism    Venous insufficiency     Past Surgical History:  Procedure Laterality Date   DILATION AND CURETTAGE OF UTERUS  ~2003   LEG SURGERY  01/2015   ORIF ANKLE FRACTURE Left 01/31/2015   Procedure: OPEN REDUCTION INTERNAL FIXATION (ORIF) DISTAL TIBIA  FRACTURE;  Surgeon: Corky Mull, MD;  [name];  Service: Orthopedics;  Laterality: Left;   PERIPHERAL VASCULAR CATHETERIZATION N/A 02/24/2015   Procedure: IVC Filter Insertion;  Surgeon: Algernon Huxley, MD;  Location: [facility];  Service: Cardiovascular;  [name];   PERIPHERAL VASCULAR CATHETERIZATION N/A 06/20/2015   Procedure: IVC Filter Removal;  Surgeon: Algernon Huxley, MD;  Location: [facility];  Service: Cardiovascular;  [name];   TUBAL LIGATION  2005   vein closure Left 2008   Left Leg   Vein Procedure     Dr. Jamal Collin    Family History  Problem Relation Age of Onset   Heart disease Mother        CHF   Diabetes Mother  Hypothyroidism Mother    Cancer Maternal Aunt        Breast and Ovarian  (Believes BRCA positive)   Cancer Other        Breast CA (Believes BRCA positive) (mom had br/ovar ca)   Cancer Maternal Aunt        Melanoma   Cervical cancer Sister        stage 3   Cancer Father        stomach    Social History   Socioeconomic History   Marital status: Married    Spouse name: Not on file   Number of children: 1   Years of education: Not on file   Highest education level: Not on file  Occupational History   Occupation: Glass blower/designer    Comment: Educational psychologist   Occupation:      Comment:    Tobacco Use   Smoking status: Never   Smokeless tobacco: Never  Vaping Use   Vaping Use: Never used  Substance and  Sexual Activity   Alcohol use: No   Drug use: No   Sexual activity: Yes  Other Topics Concern   Not on file  Social History Narrative   Not on file   Social Determinants of Health   Financial Resource Strain: Not on file  Food Insecurity: Not on file  Transportation Needs: Not on file  Physical Activity: Not on file  Stress: Not on file  Social Connections: Not on file  Intimate Partner Violence: Not on file    Current Meds  Medication Sig   cholecalciferol (VITAMIN D3) 25 MCG (1000 UNIT) tablet Take 1,000 Units by mouth daily.   ELIQUIS 5 MG TABS tablet TAKE 1 TABLET TWICE A DAY   SYNTHROID 112 MCG tablet Take 1 tablet (112 mcg total) by mouth daily before breakfast. Except take 1/2 tab on Sundays      ROS:  Review of Systems  Constitutional:  Negative for fatigue, fever and unexpected weight change.  Respiratory:  Negative for cough, shortness of breath and wheezing.   Cardiovascular:  Negative for chest pain, palpitations and leg swelling.  Gastrointestinal:  Negative for blood in stool, constipation, diarrhea, nausea and vomiting.  Endocrine: Negative for cold intolerance, heat intolerance and polyuria.  Genitourinary:  Positive for dyspareunia. Negative for dysuria, flank pain, frequency, genital sores, hematuria, menstrual problem, pelvic pain, urgency, vaginal bleeding, vaginal discharge and vaginal pain.  Musculoskeletal:  Negative for back pain, joint swelling and myalgias.  Skin:  Negative for rash.  Neurological:  Negative for dizziness, syncope, light-headedness, numbness and headaches.  Hematological:  Negative for adenopathy.  Psychiatric/Behavioral:  Negative for agitation, confusion, sleep disturbance and suicidal ideas. The patient is not nervous/anxious.     Objective: BP 110/80   Ht 5' 3"  (1.6 m)   Wt 182 lb (82.6 kg)   LMP 08/16/2014   BMI 32.24 kg/m    Physical Exam Constitutional:      Appearance: She is well-developed.  Genitourinary:      Vulva normal.     Genitourinary Comments: EXT VAGINAL ATROPHY/PALE TISSUE; NOW HAS RUBBERY THICK AREAS POST FOURCHETTE, C/W WITH LS; NO FISSURES     Right Labia: rash.     Right Labia: No tenderness or lesions.    Left Labia: rash.     Left Labia: No tenderness or lesions.       No vaginal discharge, erythema or tenderness.      Right Adnexa: not tender and no mass present.  Left Adnexa: not tender and no mass present.    No cervical motion tenderness, friability or polyp.     Uterus is not enlarged or tender.  Rectum:     Guaiac result negative.     No anal fissure or tenderness.  Breasts:    Right: No mass, nipple discharge, skin change or tenderness.     Left: No mass, nipple discharge, skin change or tenderness.  Neck:     Thyroid: No thyromegaly.  Cardiovascular:     Rate and Rhythm: Normal rate and regular rhythm.     Heart sounds: Normal heart sounds. No murmur heard. Pulmonary:     Effort: Pulmonary effort is normal.     Breath sounds: Normal breath sounds.  Abdominal:     Palpations: Abdomen is soft.     Tenderness: There is no abdominal tenderness. There is no guarding or rebound.  Musculoskeletal:        General: Normal range of motion.     Cervical back: Normal range of motion.  Lymphadenopathy:     Cervical: No cervical adenopathy.  Neurological:     General: No focal deficit present.     Mental Status: She is alert and oriented to person, place, and time.     Cranial Nerves: No cranial nerve deficit.  Skin:    General: Skin is warm and dry.  Psychiatric:        Mood and Affect: Mood normal.        Behavior: Behavior normal.        Thought Content: Thought content normal.        Judgment: Judgment normal.  Vitals reviewed.    Assessment/Plan: Encounter for annual routine gynecological examination  Cervical cancer screening - Plan: Cytology - PAP  Screening for HPV (human papillomavirus) - Plan: Cytology - PAP  Encounter for screening  mammogram for malignant neoplasm of breast - Plan: MM 3D SCREEN BREAST BILATERAL; pt to sched mammo  Family history of breast cancer - Plan: MM 3D SCREEN BREAST BILATERAL; MyRisk testing discussed and pt to f/u if desires.  Screening for colon cancer--pt will research where to do colonoscopy and call me for ref. Doesn't want to go to [facility] for procedure.  Lichen sclerosus--resume clobetasol QHS for 4 wks, then QOHS for 4 wks, then once wkly as maintenance. Discussed need for yearly check due to slight increase risk of SCC in untreated LS. Has enough Rx crm.   Hypothyroidism, unspecified type - Plan: TSH, T4, free; labs today, will RF based on results. Sent to [facility] per pt.   WILL CHECK fasting LIPIDS at 6 mo f/u lab appt for thyroid.          GYN counsel breast self exam, mammography screening, menopause, adequate intake of calcium and vitamin D, diet and exercise    F/U  Return in about 1 year (around 12/13/2021).  Yamel Bale B. Jayveion Stalling, PA-C 12/13/2020 10:17 AM

## 2020-12-13 ENCOUNTER — Other Ambulatory Visit (HOSPITAL_COMMUNITY)
Admission: RE | Admit: 2020-12-13 | Discharge: 2020-12-13 | Disposition: A | Discharge status: Home / Self Care | Payer: BC Managed Care – PPO | Source: Ambulatory Visit | Attending: Obstetrics and Gynecology | Admitting: Obstetrics and Gynecology

## 2020-12-13 ENCOUNTER — Encounter: Payer: Self-pay | Admitting: Obstetrics and Gynecology

## 2020-12-13 ENCOUNTER — Other Ambulatory Visit: Payer: Self-pay

## 2020-12-13 ENCOUNTER — Ambulatory Visit (INDEPENDENT_AMBULATORY_CARE_PROVIDER_SITE_OTHER): Payer: BC Managed Care – PPO | Admitting: Obstetrics and Gynecology

## 2020-12-13 VITALS — BP 110/80 | Ht 63.0 in | Wt 182.0 lb

## 2020-12-13 DIAGNOSIS — Z01419 Encounter for gynecological examination (general) (routine) without abnormal findings: Secondary | ICD-10-CM

## 2020-12-13 DIAGNOSIS — Z124 Encounter for screening for malignant neoplasm of cervix: Secondary | ICD-10-CM | POA: Insufficient documentation

## 2020-12-13 DIAGNOSIS — Z803 Family history of malignant neoplasm of breast: Secondary | ICD-10-CM

## 2020-12-13 DIAGNOSIS — Z1151 Encounter for screening for human papillomavirus (HPV): Secondary | ICD-10-CM | POA: Insufficient documentation

## 2020-12-13 DIAGNOSIS — Z1211 Encounter for screening for malignant neoplasm of colon: Secondary | ICD-10-CM

## 2020-12-13 DIAGNOSIS — L900 Lichen sclerosus et atrophicus: Secondary | ICD-10-CM

## 2020-12-13 DIAGNOSIS — E039 Hypothyroidism, unspecified: Secondary | ICD-10-CM | POA: Diagnosis not present

## 2020-12-13 DIAGNOSIS — Z1231 Encounter for screening mammogram for malignant neoplasm of breast: Secondary | ICD-10-CM

## 2020-12-13 NOTE — Patient Instructions (Signed)
I value your feedback and you entrusting us with your care. If you get a [facility] patient survey, I would appreciate you taking the time to let us know about your experience today. Thank you!  [facility]: [phone]      

## 2020-12-14 ENCOUNTER — Other Ambulatory Visit: Payer: Self-pay | Admitting: Internal Medicine

## 2020-12-14 ENCOUNTER — Other Ambulatory Visit: Payer: Self-pay | Admitting: Obstetrics and Gynecology

## 2020-12-14 DIAGNOSIS — I82401 Acute embolism and thrombosis of unspecified deep veins of right lower extremity: Secondary | ICD-10-CM

## 2020-12-14 DIAGNOSIS — E039 Hypothyroidism, unspecified: Secondary | ICD-10-CM

## 2020-12-14 LAB — CYTOLOGY - PAP
Comment: NEGATIVE
Diagnosis: NEGATIVE
High risk HPV: NEGATIVE

## 2020-12-14 LAB — T4, FREE: Free T4: 1.37 ng/dL (ref 0.82–1.77)

## 2020-12-14 LAB — TSH: TSH: 7.38 u[IU]/mL — ABNORMAL HIGH (ref 0.450–4.500)

## 2020-12-14 MED ORDER — SYNTHROID 112 MCG PO TABS: 112.0000 ug | ORAL_TABLET | Freq: Every day | ORAL | 0 refills | Status: DC

## 2020-12-14 NOTE — Addendum Note (Signed)
Addended by: Althea Grimmer B on: 12/14/2020 09:15 AM   Modules accepted: Orders

## 2020-12-15 ENCOUNTER — Other Ambulatory Visit: Payer: Self-pay | Admitting: *Deleted

## 2020-12-15 DIAGNOSIS — I82401 Acute embolism and thrombosis of unspecified deep veins of right lower extremity: Secondary | ICD-10-CM

## 2020-12-15 MED ORDER — APIXABAN 5 MG PO TABS: 5.0000 mg | ORAL_TABLET | Freq: Two times a day (BID) | ORAL | 0 refills | Status: DC

## 2020-12-15 NOTE — Telephone Encounter (Signed)
Patient called needing her Eliquis sent locally to tide her over until the mail order refill comes in. Sent in 30 day prescription to [facility] as per patient request

## 2021-01-02 ENCOUNTER — Ambulatory Visit
Admission: RE | Admit: 2021-01-02 | Discharge: 2021-01-02 | Disposition: A | Discharge status: Home / Self Care | Payer: BC Managed Care – PPO | Source: Ambulatory Visit | Attending: Obstetrics and Gynecology | Admitting: Obstetrics and Gynecology

## 2021-01-02 ENCOUNTER — Other Ambulatory Visit: Payer: Self-pay

## 2021-01-02 DIAGNOSIS — Z803 Family history of malignant neoplasm of breast: Secondary | ICD-10-CM | POA: Insufficient documentation

## 2021-01-02 DIAGNOSIS — Z1231 Encounter for screening mammogram for malignant neoplasm of breast: Secondary | ICD-10-CM | POA: Insufficient documentation

## 2021-01-03 ENCOUNTER — Encounter: Payer: Self-pay | Admitting: Obstetrics and Gynecology

## 2021-01-23 ENCOUNTER — Other Ambulatory Visit: Payer: Self-pay

## 2021-01-23 ENCOUNTER — Inpatient Hospital Stay: Payer: BC Managed Care – PPO

## 2021-01-23 ENCOUNTER — Encounter: Payer: Self-pay | Admitting: Internal Medicine

## 2021-01-23 ENCOUNTER — Inpatient Hospital Stay: Payer: BC Managed Care – PPO | Attending: Internal Medicine | Admitting: Internal Medicine

## 2021-01-23 DIAGNOSIS — I82502 Chronic embolism and thrombosis of unspecified deep veins of left lower extremity: Secondary | ICD-10-CM

## 2021-01-23 DIAGNOSIS — Z7901 Long term (current) use of anticoagulants: Secondary | ICD-10-CM | POA: Diagnosis not present

## 2021-01-23 DIAGNOSIS — I82401 Acute embolism and thrombosis of unspecified deep veins of right lower extremity: Secondary | ICD-10-CM | POA: Diagnosis not present

## 2021-01-23 LAB — CBC WITH DIFFERENTIAL/PLATELET
Abs Immature Granulocytes: 0.02 10*3/uL (ref 0.00–0.07)
Basophils Absolute: 0.1 10*3/uL (ref 0.0–0.1)
Basophils Relative: 1 %
Eosinophils Absolute: 0.1 10*3/uL (ref 0.0–0.5)
Eosinophils Relative: 1 %
HCT: 41.9 % (ref 36.0–46.0)
Hemoglobin: 13.8 g/dL (ref 12.0–15.0)
Immature Granulocytes: 0 %
Lymphocytes Relative: 31 %
Lymphs Abs: 2.1 10*3/uL (ref 0.7–4.0)
MCH: 29.1 pg (ref 26.0–34.0)
MCHC: 32.9 g/dL (ref 30.0–36.0)
MCV: 88.4 fL (ref 80.0–100.0)
Monocytes Absolute: 0.4 10*3/uL (ref 0.1–1.0)
Monocytes Relative: 6 %
Neutro Abs: 4.1 10*3/uL (ref 1.7–7.7)
Neutrophils Relative %: 61 %
Platelets: 360 10*3/uL (ref 150–400)
RBC: 4.74 MIL/uL (ref 3.87–5.11)
RDW: 12.6 % (ref 11.5–15.5)
WBC: 6.7 10*3/uL (ref 4.0–10.5)
nRBC: 0 % (ref 0.0–0.2)

## 2021-01-23 LAB — COMPREHENSIVE METABOLIC PANEL
ALT: 12 U/L (ref 0–44)
AST: 16 U/L (ref 15–41)
Albumin: 4.1 g/dL (ref 3.5–5.0)
Alkaline Phosphatase: 95 U/L (ref 38–126)
Anion gap: 6 (ref 5–15)
BUN: 11 mg/dL (ref 6–20)
CO2: 27 mmol/L (ref 22–32)
Calcium: 8.7 mg/dL — ABNORMAL LOW (ref 8.9–10.3)
Chloride: 102 mmol/L (ref 98–111)
Creatinine, Ser: 0.78 mg/dL (ref 0.44–1.00)
GFR, Estimated: >60 mL/min (ref >60)
Glucose, Bld: 104 mg/dL — ABNORMAL HIGH (ref 70–99)
Potassium: 3.7 mmol/L (ref 3.5–5.1)
Sodium: 135 mmol/L (ref 135–145)
Total Bilirubin: 0.4 mg/dL (ref 0.3–1.2)
Total Protein: 7.7 g/dL (ref 6.5–8.1)

## 2021-01-23 MED ORDER — APIXABAN 5 MG PO TABS: 5.0000 mg | ORAL_TABLET | Freq: Two times a day (BID) | ORAL | 6 refills | Status: DC

## 2021-01-23 NOTE — Assessment & Plan Note (Addendum)
#  Recurrent DVT/[name] [with acquired risk factors limited mobility-ankle surgery].  On indefinite anticoagulation.  On Eliquis.  STABLE.   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 .   #Obesity/weight gain-unable to lose weight.  Recommend follow-up with PCP regarding weight loss interventions.  # DISPOSITION: # follow up in 6 months- MD/labs- cbc/cmp-Dr.B

## 2021-01-23 NOTE — Progress Notes (Signed)
Taylor Frederick NOTE  Patient Care Team: Taylor Carbon, MD as PCP - General Taylor Lye, 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; [name]; [name]; [name]  Denies any blood in stools or black or stools.  Chronic mild swelling of the legs.  Intermittently using compression stockings.  Review of Systems  Constitutional:  Positive for malaise/fatigue. Negative for chills, diaphoresis, fever and weight loss.  HENT:  Negative for nosebleeds and sore throat.   Eyes:  Negative for double vision.  Respiratory:  Negative for cough, hemoptysis, sputum production, shortness of breath and wheezing.   Cardiovascular:  Positive for leg swelling. Negative for chest pain, palpitations and orthopnea.  Gastrointestinal:  Negative for abdominal pain, blood in stool, constipation, diarrhea, heartburn, melena, nausea and vomiting.  Genitourinary:  Negative for dysuria, frequency and urgency.  Musculoskeletal:  Negative for back pain.  Skin: Negative.  Negative for itching and rash.  Neurological:  Negative for dizziness, tingling, focal weakness, weakness and headaches.  Endo/Heme/Allergies:  Does not bruise/bleed easily.  Psychiatric/Behavioral:  Negative for depression. The patient is not  nervous/anxious and does not have insomnia.     MEDICAL HISTORY:  Past Medical History:  Diagnosis Date   Asymptomatic varicose veins    Diffuse cystic mastopathy    [place]   With BCP's   Edema of both legs    Hypothyroidism    Venous insufficiency     SURGICAL HISTORY: Past Surgical History:  Procedure Laterality Date   DILATION AND CURETTAGE OF UTERUS  ~2003   LEG SURGERY  01/2015   ORIF ANKLE FRACTURE Left 01/31/2015   Procedure: OPEN REDUCTION INTERNAL FIXATION (ORIF) DISTAL TIBIA  FRACTURE;  [name];  [name];  Service: Orthopedics;  Laterality: Left;   PERIPHERAL VASCULAR CATHETERIZATION N/A 02/24/2015   Procedure: IVC Filter Insertion;  [name];  [place] [facility];  Service: Cardiovascular;  [name];   PERIPHERAL VASCULAR CATHETERIZATION N/A 06/20/2015   Procedure: IVC Filter Removal;  [name];  [facility];  Service: Cardiovascular;  [name];   TUBAL LIGATION  2005   vein closure Left 2008   Left Leg   Vein Procedure     Dr. SJamal Frederick   SOCIAL HISTORY: Social History   Socioeconomic History   Marital status: Married    Spouse name: Not on file   Number of children: 1   Years of education: Not on file   Highest education level: Not on file  Occupational History   Occupation: OGlass blower/designer   Comment: BEducational psychologist  Occupation:      Comment:    Tobacco Use   Smoking status: Never  Smokeless tobacco: Never  Vaping Use   Vaping Use: Never used  Substance and Sexual Activity   Alcohol use: No   Drug use: No   Sexual activity: Yes  Other Topics Concern   Not on file  Social History Narrative   Not on file   Social Determinants of Health   Financial Resource Strain: Not on file  Food Insecurity: Not on file  Transportation Needs: Not on file  Physical Activity: Not on file  Stress: Not on file  Social  Connections: Not on file  Intimate Partner Violence: Not on file    FAMILY HISTORY: Family History  Problem Relation Age of Onset   Heart disease Mother        CHF   Diabetes Mother    Hypothyroidism Mother    Cancer Father        stomach   Cervical cancer Sister        stage 3   Breast cancer Maternal Aunt    Cancer Maternal Aunt        Breast and Ovarian  (Believes BRCA positive)   Cancer Maternal Aunt        Melanoma   Breast cancer Other 24       1sr cousin   Cancer Other        Breast CA (Believes BRCA positive) (mom had br/ovar ca)    ALLERGIES:  is allergic to estrogens.  MEDICATIONS:  Current Outpatient Medications  Medication Sig Dispense Refill   cholecalciferol (VITAMIN D3) 25 MCG (1000 UNIT) tablet Take 1,000 Units by mouth daily.     SYNTHROID 112 MCG tablet Take 1 tablet (112 mcg total) by mouth daily before breakfast. Except take 1 1/2 tab on Sundays (NOTE DOSE INCREASE) 33 tablet 0   apixaban (ELIQUIS) 5 MG TABS tablet Take 1 tablet (5 mg total) by mouth 2 (two) times daily. 60 tablet 6   No current facility-administered medications for this visit.      Marland Kitchen  PHYSICAL EXAMINATION:   Vitals:   01/23/21 1331  BP: 121/71  Pulse: 61  Resp: 18  Temp: (!) 97.5 F (36.4 C)   Filed Weights   01/23/21 1331  Weight: 181 lb 9.6 oz (82.4 kg)    Physical Exam HENT:     Head: Normocephalic and atraumatic.     Mouth/Throat:     Pharynx: No oropharyngeal exudate.  Eyes:     Pupils: Pupils are equal, round, [name], place, and time.  Psychiatric:        Mood and Affect: Affect normal.    LABORATORY DATA:  I have reviewed the data as listed Lab Results  Component Value Date  WBC 6.7 01/23/2021   HGB 13.8 01/23/2021   HCT 41.9 01/23/2021   MCV 88.4 01/23/2021   PLT 360 01/23/2021   Recent Labs    02/02/20 1254 07/25/20 1250 01/23/21 1318  NA 138 138 135  K 3.7 3.9 3.7  CL 102 101 102  CO2 31 28 27   GLUCOSE 119* 114* 104*  BUN 11 12 11   CREATININE 0.72 0.83 0.78  CALCIUM 9.2 9.1 8.7*  GFRNONAA >60 >60 >60  PROT 7.7 7.7 7.7  ALBUMIN 4.2 4.0 4.1  AST 17 16 16   ALT 14 14 12   ALKPHOS 79 95 95  BILITOT 0.5 0.5 0.4    RADIOGRAPHIC STUDIES: I have personally reviewed the radiological images as listed and agreed with the findings in the report. MM 3D SCREEN BREAST BILATERAL  Result Date: 01/03/2021 CLINICAL DATA:  Screening. EXAM: DIGITAL SCREENING BILATERAL MAMMOGRAM WITH TOMOSYNTHESIS AND CAD TECHNIQUE: Bilateral screening digital craniocaudal and mediolateral oblique mammograms were obtained. Bilateral screening digital breast tomosynthesis was performed. The images were evaluated with computer-aided detection. COMPARISON:  Previous exam(s). ACR Breast Density Category b: There are scattered areas of fibroglandular density. FINDINGS: There are no findings suspicious for malignancy. IMPRESSION: No mammographic evidence of malignancy. A result letter of this screening mammogram will be mailed directly to the patient. RECOMMENDATION: Screening mammogram in one year. (Code:SM-B-01Y) BI-RADS CATEGORY  1: Negative. Electronically Signed   By: Lovey Newcomer M.D.   On: 01/03/2021 12:56   ASSESSMENT & PLAN:   Leg DVT (deep venous thromboembolism), chronic, left (HCC) #Recurrent  DVT/[name] [with acquired risk factors limited mobility-ankle surgery].  On indefinite anticoagulation.  On Eliquis.  STABLE.   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 .   #Obesity/weight gain-unable to lose weight.  Recommend follow-up with PCP regarding weight loss interventions.  # DISPOSITION: # follow up in 6 months- MD/labs- cbc/cmp-Dr.B    Taylor Sickle, MD 01/23/2021 1:51 PM

## 2021-01-23 NOTE — Progress Notes (Signed)
Pt here for follow up. No new concerns voiced.   

## 2021-02-05 DIAGNOSIS — R42 Dizziness and giddiness: Secondary | ICD-10-CM | POA: Diagnosis not present

## 2021-02-13 ENCOUNTER — Other Ambulatory Visit: Payer: Self-pay

## 2021-02-13 ENCOUNTER — Other Ambulatory Visit: Payer: BC Managed Care – PPO

## 2021-02-13 DIAGNOSIS — E039 Hypothyroidism, unspecified: Secondary | ICD-10-CM

## 2021-02-14 ENCOUNTER — Telehealth: Payer: Self-pay | Admitting: Obstetrics and Gynecology

## 2021-02-14 LAB — T4, FREE: Free T4: 1.45 ng/dL (ref 0.82–1.77)

## 2021-02-14 LAB — TSH: TSH: 1.46 u[IU]/mL (ref 0.450–4.500)

## 2021-02-14 MED ORDER — SYNTHROID 112 MCG PO TABS: 112.0000 ug | ORAL_TABLET | Freq: Every day | ORAL | 1 refills | Status: DC

## 2021-02-14 NOTE — Telephone Encounter (Signed)
LM for pt with thyroid results. Change to synthroid DAW 112 mcg daily, including Sun. Rx RF to mail order. Rechck labs in 6 months, order placed. F/u prn.

## 2021-02-22 ENCOUNTER — Other Ambulatory Visit: Payer: Self-pay | Admitting: Obstetrics and Gynecology

## 2021-02-22 DIAGNOSIS — E039 Hypothyroidism, unspecified: Secondary | ICD-10-CM

## 2021-02-22 NOTE — Telephone Encounter (Signed)
She needs to f/u with [facility]. It's listed as BRAND Only on her Rx.

## 2021-02-22 NOTE — Telephone Encounter (Signed)
Pt aware.

## 2021-02-22 NOTE — Telephone Encounter (Signed)
Pt calling; [facility] has sent the generic of synthroid; should be in her file that she only take the brand name.  [phone]

## 2021-06-08 ENCOUNTER — Other Ambulatory Visit: Payer: Self-pay | Admitting: Obstetrics and Gynecology

## 2021-06-08 ENCOUNTER — Telehealth: Payer: Self-pay

## 2021-06-08 DIAGNOSIS — E039 Hypothyroidism, unspecified: Secondary | ICD-10-CM

## 2021-06-08 MED ORDER — SYNTHROID 112 MCG PO TABS: 112.0000 ug | ORAL_TABLET | Freq: Every day | ORAL | 0 refills | Status: DC

## 2021-06-08 NOTE — Telephone Encounter (Signed)
Pt aware.

## 2021-06-08 NOTE — Telephone Encounter (Signed)
Done. Pls notify pt. Labs due 6/23

## 2021-06-08 NOTE — Telephone Encounter (Signed)
Pt calling about her synthroid rx.  [phone]  Pt states [facility] says they need a new rx stating 'dispence as written and the reason why; thought she would have refill by now but has been out since last Thurs; also, can a 30d supply be called in to [facility] on file? ?

## 2021-06-08 NOTE — Progress Notes (Signed)
Rx synthroid to local pharm for 1 mo and [facility]. Labs due 6/23. 36 months of Tx sent [facility] 12/22 so not sure why not valid ?

## 2021-06-15 ENCOUNTER — Telehealth: Payer: Self-pay

## 2021-06-15 NOTE — Telephone Encounter (Signed)
Rx says BRAND ONLY, DAW so she should get that. Needs to call pharm if she doesn't get Brand only. No addl RF sent in because pt due for labs 5/23.

## 2021-06-15 NOTE — Telephone Encounter (Signed)
Pt calling regarding synthroid rx.  [phone]  Pt states [facility] wasn't able to fill rx b/c [facility] had already shipped it and ins wouldn't let them.  Pt states pharmacist at [facility] told he they have a program in place to where rx can be written for the generic and they would fill it with the Brand; adv that did not sound right to me; to call and speak to a different pharmacist and/or call her local pharmacist and see what they say and can get the contact information for the governing body for pharmacists and report it.  Pt concerned that when this program is over they will see rx was written for generic and fill it with generic. Bottom Line:  pt wants to make sure rx to [facility] says 'BRAND ONLY' as well and for 90d supply.  Pt says she should get mail order today as it was shipped on the 10th.  ?

## 2021-07-01 DIAGNOSIS — Z209 Contact with and (suspected) exposure to unspecified communicable disease: Secondary | ICD-10-CM | POA: Diagnosis not present

## 2021-07-01 DIAGNOSIS — B9689 Other specified bacterial agents as the cause of diseases classified elsewhere: Secondary | ICD-10-CM | POA: Diagnosis not present

## 2021-07-01 DIAGNOSIS — J019 Acute sinusitis, unspecified: Secondary | ICD-10-CM | POA: Diagnosis not present

## 2021-07-01 DIAGNOSIS — J209 Acute bronchitis, unspecified: Secondary | ICD-10-CM | POA: Diagnosis not present

## 2021-07-24 ENCOUNTER — Encounter: Payer: Self-pay | Admitting: Internal Medicine

## 2021-07-24 ENCOUNTER — Inpatient Hospital Stay: Payer: BC Managed Care – PPO | Attending: Internal Medicine

## 2021-07-24 ENCOUNTER — Inpatient Hospital Stay (HOSPITAL_BASED_OUTPATIENT_CLINIC_OR_DEPARTMENT_OTHER): Payer: BC Managed Care – PPO | Admitting: Internal Medicine

## 2021-07-24 DIAGNOSIS — E669 Obesity, unspecified: Secondary | ICD-10-CM | POA: Diagnosis not present

## 2021-07-24 DIAGNOSIS — I82502 Chronic embolism and thrombosis of unspecified deep veins of left lower extremity: Secondary | ICD-10-CM | POA: Diagnosis not present

## 2021-07-24 DIAGNOSIS — M7989 Other specified soft tissue disorders: Secondary | ICD-10-CM | POA: Diagnosis not present

## 2021-07-24 DIAGNOSIS — Z7901 Long term (current) use of anticoagulants: Secondary | ICD-10-CM | POA: Diagnosis not present

## 2021-07-24 DIAGNOSIS — I82401 Acute embolism and thrombosis of unspecified deep veins of right lower extremity: Secondary | ICD-10-CM

## 2021-07-24 DIAGNOSIS — Z86711 Personal history of pulmonary embolism: Secondary | ICD-10-CM | POA: Insufficient documentation

## 2021-07-24 LAB — CBC WITH DIFFERENTIAL/PLATELET
Abs Immature Granulocytes: 0.01 10*3/uL (ref 0.00–0.07)
Basophils Absolute: 0.1 10*3/uL (ref 0.0–0.1)
Basophils Relative: 1 %
Eosinophils Absolute: 0.1 10*3/uL (ref 0.0–0.5)
Eosinophils Relative: 2 %
HCT: 41 % (ref 36.0–46.0)
Hemoglobin: 13.6 g/dL (ref 12.0–15.0)
Immature Granulocytes: 0 %
Lymphocytes Relative: 36 %
Lymphs Abs: 2.2 10*3/uL (ref 0.7–4.0)
MCH: 29 pg (ref 26.0–34.0)
MCHC: 33.2 g/dL (ref 30.0–36.0)
MCV: 87.4 fL (ref 80.0–100.0)
Monocytes Absolute: 0.4 10*3/uL (ref 0.1–1.0)
Monocytes Relative: 6 %
Neutro Abs: 3.3 10*3/uL (ref 1.7–7.7)
Neutrophils Relative %: 55 %
Platelets: 343 10*3/uL (ref 150–400)
RBC: 4.69 MIL/uL (ref 3.87–5.11)
RDW: 12.8 % (ref 11.5–15.5)
WBC: 6 10*3/uL (ref 4.0–10.5)
nRBC: 0 % (ref 0.0–0.2)

## 2021-07-24 LAB — COMPREHENSIVE METABOLIC PANEL
ALT: 17 U/L (ref 0–44)
AST: 18 U/L (ref 15–41)
Albumin: 3.9 g/dL (ref 3.5–5.0)
Alkaline Phosphatase: 84 U/L (ref 38–126)
Anion gap: 5 (ref 5–15)
BUN: 11 mg/dL (ref 6–20)
CO2: 28 mmol/L (ref 22–32)
Calcium: 8.6 mg/dL — ABNORMAL LOW (ref 8.9–10.3)
Chloride: 103 mmol/L (ref 98–111)
Creatinine, Ser: 0.71 mg/dL (ref 0.44–1.00)
GFR, Estimated: >60 mL/min (ref >60)
Glucose, Bld: 104 mg/dL — ABNORMAL HIGH (ref 70–99)
Potassium: 3.5 mmol/L (ref 3.5–5.1)
Sodium: 136 mmol/L (ref 135–145)
Total Bilirubin: 0.5 mg/dL (ref 0.3–1.2)
Total Protein: 7.3 g/dL (ref 6.5–8.1)

## 2021-07-24 NOTE — Assessment & Plan Note (Addendum)
#  Recurrent DVT/[name] [with acquired risk factors limited mobility-ankle surgery].  On indefinite anticoagulation.  On Eliquis.  STABLE.   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   # Hypocalcemia- 8.6- on vit D; recommend ca 500 mg BID.   # Obesity/weight gain-unable to lose weight.  Recommend follow-up with PCP/gynecoloist regarding weight loss interventions.  # DISPOSITION: # follow up in 1st week of dec, 2023- MD/labs- cbc/cmp-Dr.B

## 2021-07-24 NOTE — Progress Notes (Signed)
[name] NOTE  Patient Care Team: Venia Carbon, [name], MD as Consulting Physician (General Surgery) Cammie Sickle, MD as Consulting Physician (Oncology)  CHIEF COMPLAINTS/PURPOSE OF CONSULTATION:   # 2003- DVT LLE (behind Knee) [? 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; Superficial superficial vein- Left Medial LLE- started on Lovenox 5m BID; June 2017- On Eliquis BID  HISTORY OF PRESENTING ILLNESS: Alone.  Ambulating independently. Taylor Frederick 61y.o.  female above history of recurrent DVT; prior history of PE- currently on Eliquis.  Continues to be frustrated with her ongoing weight gain. Patient denies any new blood clots.  Denies any blood in stools or black or stools.  Chronic mild swelling of the legs.  Intermittently using compression stockings.  Review of Systems  Constitutional:  Positive for malaise/fatigue. Negative for chills, diaphoresis, fever and weight loss.  HENT:  Negative for nosebleeds and sore throat.   Eyes:  Negative for double vision.  Respiratory:  Negative for cough, hemoptysis, sputum production, shortness of breath and wheezing.   Cardiovascular:  Positive for leg swelling. Negative for chest pain, palpitations and orthopnea.  Gastrointestinal:  Negative for abdominal pain, blood in stool, constipation, diarrhea, heartburn, melena, nausea and vomiting.  Genitourinary:  Negative for dysuria, frequency and urgency.  Musculoskeletal:  Negative for back pain.  Skin: Negative.  Negative for itching and rash.  Neurological:  Negative for dizziness, tingling, focal weakness, weakness  and headaches.  Endo/Heme/Allergies:  Does not bruise/bleed easily.  Psychiatric/Behavioral:  Negative for depression. The patient is not nervous/anxious and does not have insomnia.     MEDICAL HISTORY:  Past Medical History:  Diagnosis Date   Asymptomatic varicose veins    Diffuse cystic mastopathy    DV[place]) [place]   With BCP's   Edema of both legs    Hypothyroidism    Venous insufficiency     SURGICAL HISTORY: Past Surgical History:  Procedure Laterality Date   DILATION AND CURETTAGE OF UTERUS  ~2003   LEG SURGERY  01/2015   ORIF ANKLE FRACTURE Left 01/31/2015   Procedure: OPEN REDUCTION INTERNAL FIXATION (ORIF) DISTAL TIBIA  FRACTURE;  [name];  [name];  Service: Orthopedics;  Laterality: Left;   PERIPHERAL VASCULAR CATHETERIZATION N/A 02/24/2015   Procedure: IVC Filter Insertion;  [name];  [facility];  Service: Cardiovascular;  [name];   PERIPHERAL VASCULAR CATHETERIZATION N/A 06/20/2015   Procedure: IVC Filter Removal;  [name];  [facility];  Service: Cardiovascular;  [name];   TUBAL LIGATION  2005   vein closure Left 2008   Left Leg   Vein Procedure     Dr. SJamal Collin   SOCIAL HISTORY: Social History   Socioeconomic History   Marital status: Married    Spouse name: Not on file   Number of children: 1   Years of education: Not on file   Highest education level: Not on file  Occupational History   Occupation: OGlass blower/designer   Comment: BEducational psychologist  Occupation:      Comment:    Tobacco Use   Smoking status: Never   Smokeless tobacco: Never  Vaping Use   Vaping Use: Never used  Substance and Sexual Activity   Alcohol use: No   Drug use: No   Sexual activity: Yes  Other Topics Concern   Not on file  Social History Narrative   Not on file   Social Determinants of Health   Financial Resource Strain: Not  on file  Food Insecurity: Not on file  Transportation Needs: Not on file  Physical Activity: Not on file  Stress: Not on file  Social Connections: Not on file  Intimate Partner Violence: Not on file    FAMILY HISTORY: Family History  Problem Relation Age of Onset   Heart disease Mother        CHF   Diabetes Mother    Hypothyroidism Mother    Cancer Father        stomach   Cervical cancer Sister        stage 3   Breast cancer Maternal Aunt    Cancer Maternal Aunt        Breast and Ovarian  (Believes BRCA positive)   Cancer Maternal Aunt        Melanoma   Breast cancer Other 58       1sr cousin   Cancer Other        Breast CA (Believes BRCA positive) (mom had br/ovar ca)    ALLERGIES:  is allergic to estrogens.  MEDICATIONS:  Current Outpatient Medications  Medication Sig Dispense Refill   apixaban (ELIQUIS) 5 MG TABS tablet Take 1 tablet (5 mg total) by mouth 2 (two) times daily. 60 tablet 6   cholecalciferol (VITAMIN D3) 25 MCG (1000 UNIT) tablet Take 1,000 Units by mouth daily.     SYNTHROID 112 MCG tablet Take 1 tablet (112 mcg total) by mouth daily before breakfast. NOTE DOSE CHANGE 30 tablet 0   No current facility-administered medications for this visit.      Marland Kitchen  PHYSICAL EXAMINATION:   Vitals:   07/24/21 1327  BP: 121/82  Pulse: 78  Temp: 98.3 F (36.8 C)  SpO2: 99%   Filed Weights   07/24/21 1327  Weight: 186 lb 6.4 oz (84.6 kg)    Physical Exam HENT:     Head: Normocephalic and atraumatic.     Mouth/Throat:     Pharynx: No oropharyngeal exudate.  Eyes:     Pupils: Pupils are equal, round, and reactive to light.  Cardiovascular:     Rate and Rhythm: Normal rate and regular rhythm.  Pulmonary:     Effort: Pulmonary effort is normal. No respiratory distress.     Breath sounds: Normal breath sounds. No wheezing.  Abdominal:     General: Bowel sounds are normal. There is no distension.     Palpations: Abdomen is soft. There is no mass.      Tenderness: There is no abdominal tenderness. There is no guarding or rebound.  Musculoskeletal:        General: No tenderness. Normal range of motion.     Cervical back: Normal range of motion and neck supple.  Skin:    General: Skin is warm.  Neurological:     Mental Status: She is alert and oriented to person, place, and time.  Psychiatric:        Mood and Affect: Affect normal.    LABORATORY DATA:  I have reviewed the  data as listed Lab Results  Component Value Date   WBC 6.0 07/24/2021   HGB 13.6 07/24/2021   HCT 41.0 07/24/2021   MCV 87.4 07/24/2021   PLT 343 07/24/2021   Recent Labs    07/25/20 1250 01/23/21 1318 07/24/21 1314  NA 138 135 136  K 3.9 3.7 3.5  CL 101 102 103  CO2 28 27 28   GLUCOSE 114* 104* 104*  BUN 12 11 11   CREATININE 0.83 0.78 0.71  CALCIUM 9.1 8.7* 8.6*  GFRNONAA >60 >60 >60  PROT 7.7 7.7 7.3  ALBUMIN 4.0 4.1 3.9  AST 16 16 18   ALT 14 12 17   ALKPHOS 95 95 84  BILITOT 0.5 0.4 0.5    RADIOGRAPHIC STUDIES: I have personally reviewed the radiological images as listed and agreed with the findings in the report. No results found.  ASSESSMENT & PLAN:   Leg DVT (deep venous thromboembolism), chronic, left (HCC) #Recurrent DVT/[name] [with acquired risk factors limited mobility-ankle surgery].  On indefinite anticoagulation.  On Eliquis.  STABLE.   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   # Hypocalcemia- 8.6- on vit D; recommend ca 500 mg BID.   # Obesity/weight gain-unable to lose weight.  Recommend follow-up with PCP/gynecoloist regarding weight loss interventions.  # DISPOSITION: # follow up in 1st week of dec, 2023- MD/labs- cbc/cmp-Dr.B    Cammie Sickle, MD 07/24/2021 2:14 PM

## 2021-07-27 ENCOUNTER — Other Ambulatory Visit: Payer: BC Managed Care – PPO

## 2021-07-27 ENCOUNTER — Other Ambulatory Visit: Payer: Self-pay | Admitting: Obstetrics and Gynecology

## 2021-07-27 DIAGNOSIS — E039 Hypothyroidism, unspecified: Secondary | ICD-10-CM

## 2021-07-27 DIAGNOSIS — H5213 Myopia, bilateral: Secondary | ICD-10-CM | POA: Diagnosis not present

## 2021-07-28 LAB — T4, FREE: Free T4: 1.59 ng/dL (ref 0.82–1.77)

## 2021-07-28 LAB — TSH: TSH: 1.21 u[IU]/mL (ref 0.450–4.500)

## 2021-07-30 ENCOUNTER — Other Ambulatory Visit: Payer: Self-pay | Admitting: Obstetrics and Gynecology

## 2021-08-01 ENCOUNTER — Other Ambulatory Visit: Payer: Self-pay | Admitting: Obstetrics and Gynecology

## 2021-08-01 DIAGNOSIS — E039 Hypothyroidism, unspecified: Secondary | ICD-10-CM

## 2021-08-01 MED ORDER — SYNTHROID 112 MCG PO TABS: 112.0000 ug | ORAL_TABLET | Freq: Every day | ORAL | 1 refills | Status: DC

## 2021-08-01 NOTE — Progress Notes (Signed)
Pls let pt know thyroid labs normal. Cont synthroid 1 tab daily, Rx RF eRxd for brand only to mail order/ Recheck labs in 6 months

## 2021-08-01 NOTE — Progress Notes (Signed)
Rx RF synthroid for 6 months.

## 2021-08-01 NOTE — Progress Notes (Signed)
Pt aware.

## 2021-08-21 ENCOUNTER — Telehealth: Payer: Self-pay

## 2021-08-21 NOTE — Telephone Encounter (Signed)
Pharmacy called wanting to give pt synthroid at generic coverage $31 dollars name brand $120.

## 2021-12-25 ENCOUNTER — Other Ambulatory Visit: Payer: Self-pay | Admitting: Internal Medicine

## 2021-12-25 DIAGNOSIS — I82401 Acute embolism and thrombosis of unspecified deep veins of right lower extremity: Secondary | ICD-10-CM

## 2021-12-26 NOTE — Telephone Encounter (Signed)
Last visit 07/24/21:  Recurrent DVT/[name] [with acquired risk factors limited mobility-ankle surgery].  On indefinite anticoagulation.  On Eliquis.  STABLE  Next visit 02/05/22

## 2021-12-28 IMAGING — MG MM DIGITAL SCREENING BILAT W/ TOMO AND CAD
8 series · 8 of 24 positions shown · non-contrast
Comparison: Previous exam(s).

CLINICAL DATA: Screening.

EXAM:
DIGITAL SCREENING BILATERAL MAMMOGRAM WITH TOMOSYNTHESIS AND CAD
TECHNIQUE: Bilateral screening digital craniocaudal and mediolateral oblique
mammograms were obtained. Bilateral screening digital breast
tomosynthesis was performed. The images were evaluated with
computer-aided detection.

[L CC synth-2D]
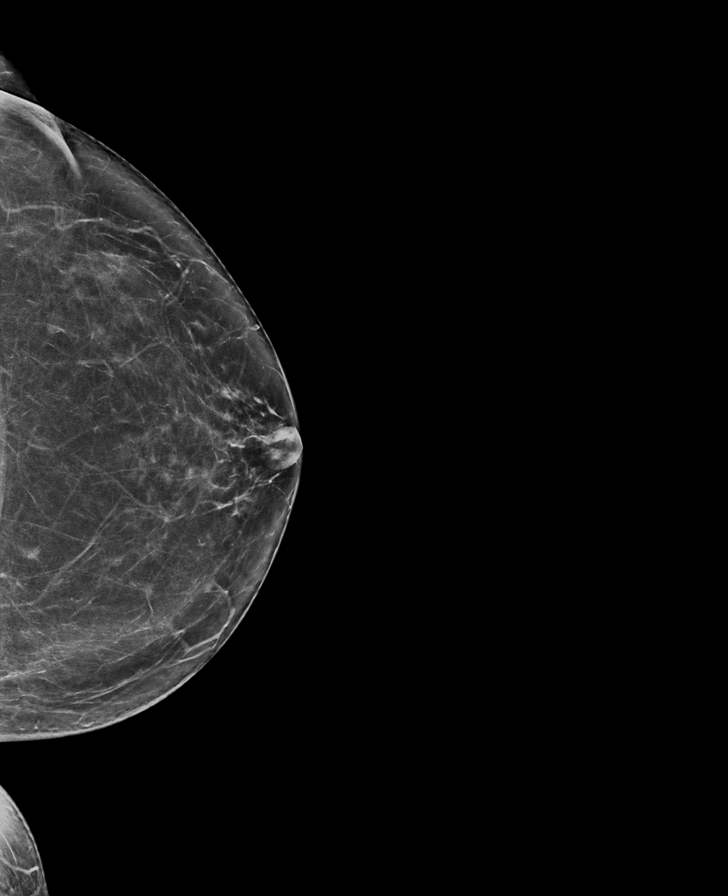

[L MLO synth-2D]
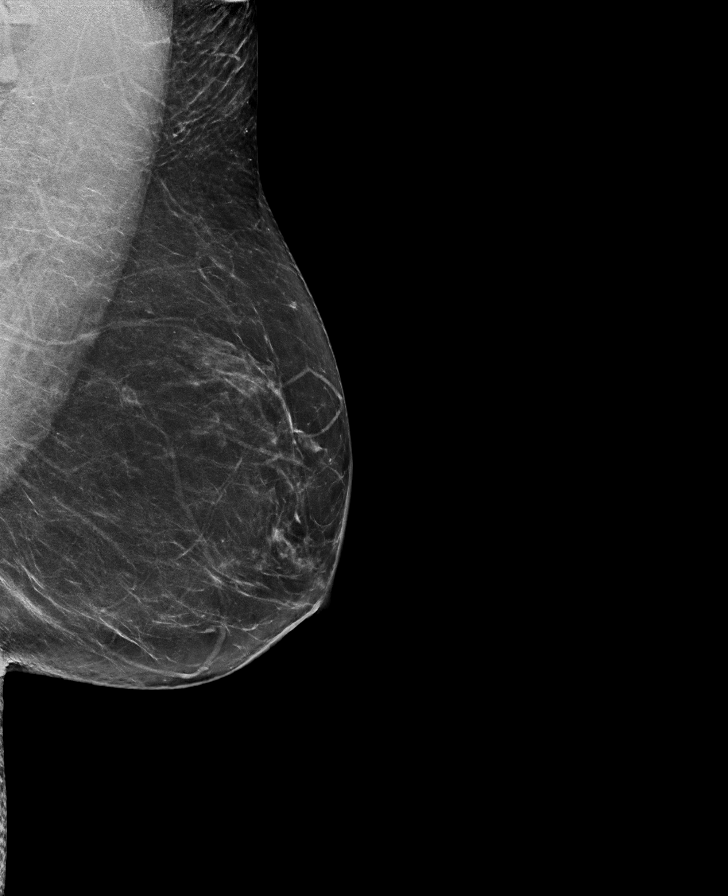

[R MLO synth-2D]
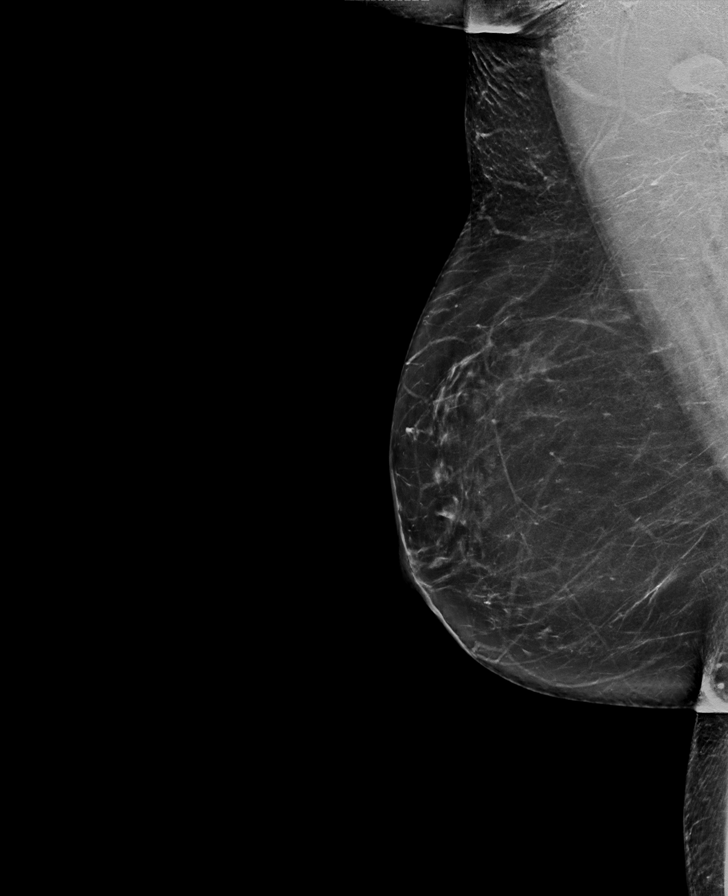

[R CC synth-2D]
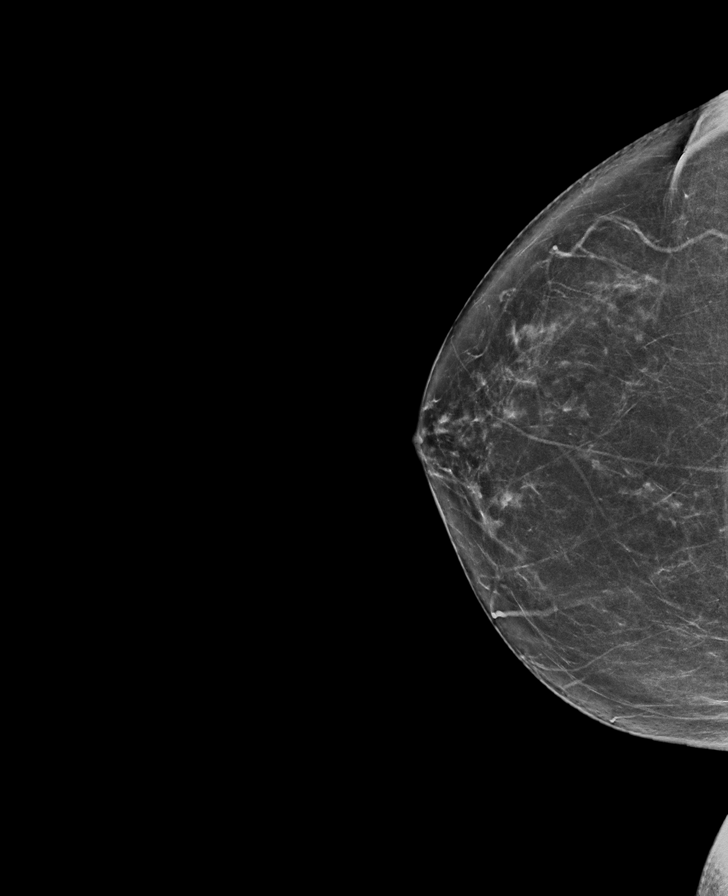

[L CC tomo · tomo slice 37/73.0]
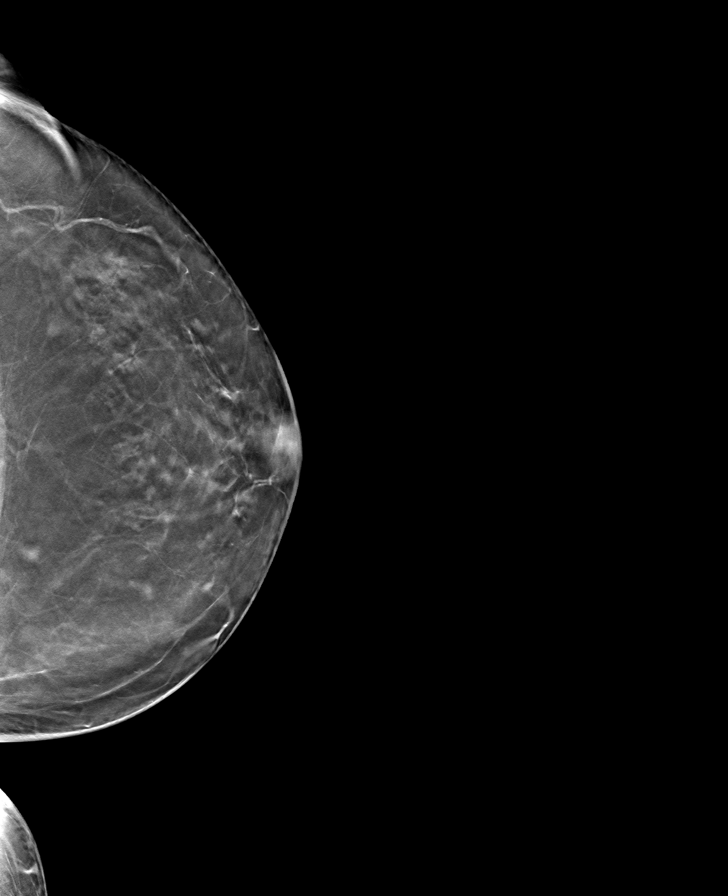

[R MLO tomo · tomo slice 41/81.0]
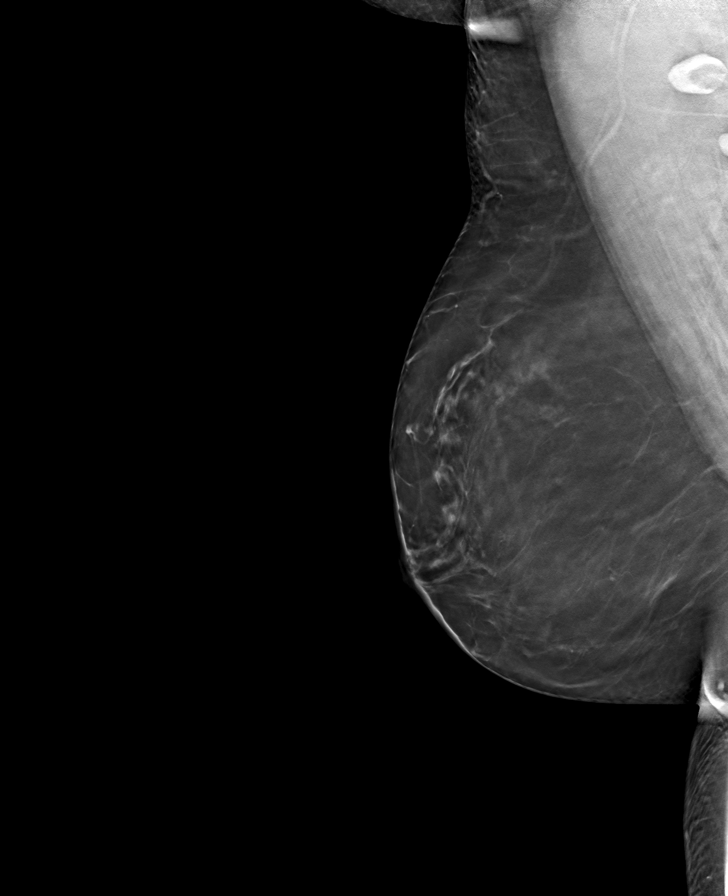

[L MLO tomo · tomo slice 39/78.0]
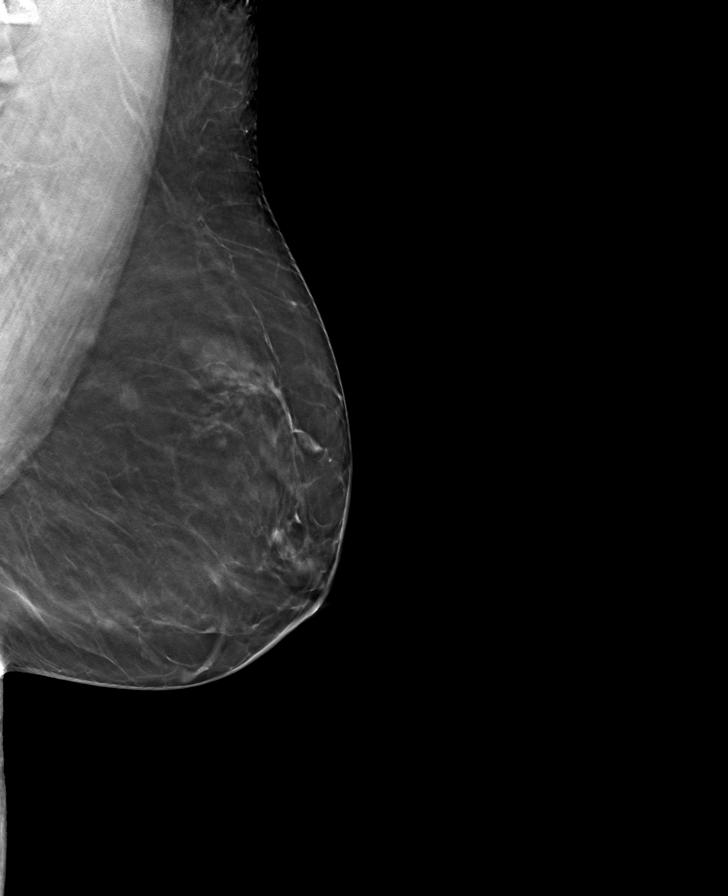

[R CC tomo · tomo slice 36/71.0]
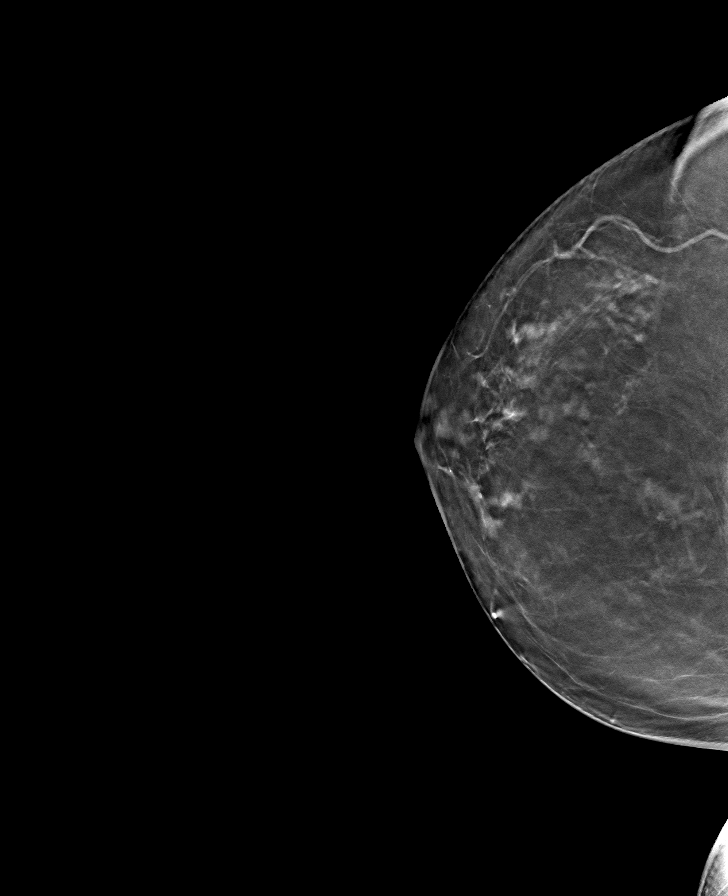

[8 of 24 positions shown; findings below may reference images not displayed]

ACR Breast Density Category b: There are scattered areas of
fibroglandular density.
FINDINGS: There are no findings suspicious for malignancy.
IMPRESSION: No mammographic evidence of malignancy. A result letter of this
screening mammogram will be mailed directly to the patient.

RECOMMENDATION:
Screening mammogram in one year. (Code:51-O-LD2)

BI-RADS CATEGORY  1: Negative.

## 2022-01-29 ENCOUNTER — Other Ambulatory Visit: Payer: Self-pay

## 2022-01-29 DIAGNOSIS — E039 Hypothyroidism, unspecified: Secondary | ICD-10-CM

## 2022-01-29 MED ORDER — SYNTHROID 112 MCG PO TABS: 112.0000 ug | ORAL_TABLET | Freq: Every day | ORAL | 3 refills | Status: DC

## 2022-01-29 NOTE — Addendum Note (Signed)
Addended by: Althea Grimmer B on: 01/29/2022 07:37 PM   Modules accepted: Orders

## 2022-01-30 ENCOUNTER — Other Ambulatory Visit: Payer: Self-pay | Admitting: Obstetrics and Gynecology

## 2022-01-30 ENCOUNTER — Other Ambulatory Visit: Payer: BC Managed Care – PPO

## 2022-01-30 DIAGNOSIS — E039 Hypothyroidism, unspecified: Secondary | ICD-10-CM

## 2022-01-30 NOTE — Telephone Encounter (Signed)
Pls cancel the Rfs only so pt can get #90 now. Will RF rest based on lab results. Thx

## 2022-01-30 NOTE — Telephone Encounter (Signed)
Done

## 2022-01-31 LAB — T4, FREE: Free T4: 0.7 ng/dL — ABNORMAL LOW (ref 0.82–1.77)

## 2022-01-31 LAB — TSH: TSH: 30.2 u[IU]/mL — ABNORMAL HIGH (ref 0.450–4.500)

## 2022-01-31 MED ORDER — SYNTHROID 100 MCG PO TABS: 100.0000 ug | ORAL_TABLET | Freq: Every day | ORAL | 0 refills | Status: DC

## 2022-01-31 NOTE — Addendum Note (Signed)
Addended by: Althea Grimmer B on: 01/31/2022 01:55 PM   Modules accepted: Orders

## 2022-01-31 NOTE — Progress Notes (Signed)
Rx levo decreased to 100 mcg daily. Rx to local pharm for #30, recheck labs in 4 wks.

## 2022-02-01 NOTE — Telephone Encounter (Signed)
Per ABC, called mail order pharmacy to cancel Rx. Rx has been cancelled. I was advised last time a RF was sent to pt was 08/11/21 (90 day supply). New Rx has been sent to pt's local pharmacy and pt to repeat her labs in 4 weeks, also due for her annual. Pt aware.

## 2022-02-05 ENCOUNTER — Inpatient Hospital Stay: Payer: BC Managed Care – PPO

## 2022-02-05 ENCOUNTER — Other Ambulatory Visit: Payer: BC Managed Care – PPO

## 2022-02-05 ENCOUNTER — Ambulatory Visit: Payer: BC Managed Care – PPO | Admitting: Nurse Practitioner

## 2022-02-05 ENCOUNTER — Inpatient Hospital Stay: Payer: BC Managed Care – PPO | Admitting: Internal Medicine

## 2022-02-14 ENCOUNTER — Inpatient Hospital Stay: Payer: BC Managed Care – PPO

## 2022-02-14 ENCOUNTER — Inpatient Hospital Stay: Payer: BC Managed Care – PPO | Admitting: Internal Medicine

## 2022-03-06 ENCOUNTER — Telehealth: Payer: Self-pay

## 2022-03-06 NOTE — Telephone Encounter (Signed)
Called pt again and transferred to FD to schedule lab appt.

## 2022-03-06 NOTE — Telephone Encounter (Signed)
Called pt to get her on the schedule for labs! She was due for labs around Christmas. If pt calls back... MAKE SURE SHE GETS A LAB APPT! Orders are in.

## 2022-03-08 ENCOUNTER — Other Ambulatory Visit: Payer: BC Managed Care – PPO

## 2022-03-08 DIAGNOSIS — E039 Hypothyroidism, unspecified: Secondary | ICD-10-CM | POA: Diagnosis not present

## 2022-03-09 LAB — T4, FREE: Free T4: 1.61 ng/dL (ref 0.82–1.77)

## 2022-03-09 LAB — TSH: TSH: 3.21 u[IU]/mL (ref 0.450–4.500)

## 2022-03-09 MED ORDER — SYNTHROID 100 MCG PO TABS: 100.0000 ug | ORAL_TABLET | Freq: Every day | ORAL | 1 refills | Status: DC

## 2022-03-09 NOTE — Progress Notes (Signed)
Pls let pt know thyroid labs normal and Rx RF eRxd for levo 100 mcg daily for 6 months to [facility]. Due for repeat labs 7/24. Will see her at 2/24 annual.

## 2022-03-22 ENCOUNTER — Telehealth: Payer: Self-pay

## 2022-03-22 NOTE — Telephone Encounter (Signed)
Patient called she needs a prior authorization for her synthroid. She has been out for about 1.5 weeks.

## 2022-03-27 NOTE — Telephone Encounter (Signed)
Called pt, no answer, LVMTRC. 

## 2022-03-29 ENCOUNTER — Telehealth: Payer: Self-pay

## 2022-03-29 DIAGNOSIS — E039 Hypothyroidism, unspecified: Secondary | ICD-10-CM

## 2022-03-29 NOTE — Telephone Encounter (Signed)
Patient is returning missed called. Please advise  °

## 2022-03-29 NOTE — Telephone Encounter (Signed)
Pt calling to speak c GA about PA on synthroid.  [phone]

## 2022-03-29 NOTE — Telephone Encounter (Signed)
Called pt back, no answer, LVMTRC.  

## 2022-04-02 MED ORDER — SYNTHROID 100 MCG PO TABS: 100.0000 ug | ORAL_TABLET | Freq: Every day | ORAL | 0 refills | Status: DC

## 2022-04-02 NOTE — Telephone Encounter (Signed)
1 mo of branded synthroid or does she want the generic to get it in her system?

## 2022-04-02 NOTE — Telephone Encounter (Signed)
Brand pls.

## 2022-04-02 NOTE — Telephone Encounter (Signed)
Pt calling to check on PA for her synthroid; has been out for 3wks now.  [phone]

## 2022-04-02 NOTE — Telephone Encounter (Signed)
Rx eRxd to [facility] but pt has 2 local pharms on file.

## 2022-04-03 NOTE — Telephone Encounter (Signed)
Pt aware. Will pick up Rx tonight.

## 2022-04-04 NOTE — Telephone Encounter (Addendum)
[employer] (PA dept for pt's insurance) [phone] and was advised insurance no longer covers this medication for this and PA is not offered. When pt's insurance was renewed, formulary changes occurred and it is a NO for Rx coverage. I was advised 3 covered options were euthyrox, levoxyl, and unithroid. Called pt to let her know and she said she picked up Rx last night at [facility] and paid $39.50 for one month supply. She asked pharm tech if that's the amount she would pay if the Rx gets filled there every month and was told yes. She would like remaining refills sent there instead of getting them through [facility].

## 2022-04-04 NOTE — Progress Notes (Unsigned)
PCP: Taylor Cordial, PA-C   No chief complaint on file.   HPI:      [name] is a 62 y.o. G1P1 who LMP was Patient's last menstrual period was 08/16/2014., presents today for her annual examination.  Her menses are absent due to menopause. She does not have PMB. No vasomotor sx.   Sex activity: single partner, contraception - post menopausal status. She does have vaginal dryness and sex is impossible. She has vag areas that are painful and itchy at times, and then resolve. They really flare with sex as well. She has tried coconut oil without much relief. SHE CAN'T HAVE ESTROGENS DUE TO HX OF 2 DVTs. Pt is on blood thinners and thinks she is Factor V Leiden positive.   LS confirmed on bx 10/21 with worsening vag sx. Pt started on clobetasol oint. Sx improved with tx but pt hasn't used cream in about 6 months. No side effects, just forgets to use it. Sex improved when was treating with oin.   Last Pap: 12/13/20  Results were: no abnormalities /neg HPV DNA. No hx of abn paps. Hx of STDs: none  Last mammogram: 01/02/21  Results were: normal--routine follow-up in 12 months. Had been followed by Dr. Jamal Frederick.  There is a FH of breast cancer in her mat aunt, who also had ovar ca, and a mat cousin. She has a 2nd aunt with melanoma. Pt's 2nd cousin did genetic testing and she thinks she was "positive". Pt has never had testing done/declined past fews yr. The patient does do self-breast exams.  Colonoscopy: never; scared of procedure. Willing to do cologuard last yr but not done. Pt considering colonoscopy and would like to research where to go.  Tobacco use: The patient denies current or previous tobacco use. Alcohol use: none  No drug use Exercise: mod active   She does not get adequate calcium but does take Vitamin D  Has hypothyroidism and was euthyroid 1/24 labs. On brand Synthroid 100 mcg, decreased from 112 mcg 11/23????daily except 1/2 tab on Sun. Due for labs. No  recent fasting labs but not fasting today.  Past Medical History:  Diagnosis Date   Asymptomatic varicose veins    Diffuse cystic mastopathy    DVT (deep venous thrombosis) ([place]) 2003   With BCP's   Edema of both legs    Hypothyroidism    Venous insufficiency     Past Surgical History:  Procedure Laterality Date   DILATION AND CURETTAGE OF UTERUS  ~2003   LEG SURGERY  01/2015   ORIF ANKLE FRACTURE Left 01/31/2015   Procedure: OPEN REDUCTION INTERNAL FIXATION (ORIF) DISTAL TIBIA  FRACTURE;  Surgeon: Taylor Mull, MD;  [name];  Service: Orthopedics;  Laterality: Left;   PERIPHERAL VASCULAR CATHETERIZATION N/A 02/24/2015   Procedure: IVC Filter Insertion;  Surgeon: Taylor Huxley, MD;  Location: [place] [facility];  Service: Cardiovascular;  [name];   PERIPHERAL VASCULAR CATHETERIZATION N/A 06/20/2015   Procedure: IVC Filter Removal;  Surgeon: Taylor Huxley, MD;  Location: [facility];  Service: Cardiovascular;  [name];   TUBAL LIGATION  2005   vein closure Left 2008   Left Leg   Vein Procedure     Dr. Jamal Frederick    Family History  Problem Relation Age of Onset   Heart disease Mother        CHF   Diabetes Mother    Hypothyroidism Mother    Cancer Father  stomach   Cervical cancer Sister        stage 3   Breast cancer Maternal Aunt    Cancer Maternal Aunt        Breast and Ovarian  (Believes BRCA positive)   Cancer Maternal Aunt        Melanoma   Breast cancer Other 36       1sr cousin   Cancer Other        Breast CA (Believes BRCA positive) (mom had br/ovar ca)    Social History   Socioeconomic History   Marital status: Married    Spouse name: Not on file   Number of children: 1   Years of education: Not on file   Highest education level: Not on file  Occupational History   Occupation: Glass blower/designer    Comment: Educational psychologist   Occupation:      Comment:    Tobacco Use   Smoking status: Never   Smokeless tobacco:  Never  Vaping Use   Vaping Use: Never used  Substance and Sexual Activity   Alcohol use: No   Drug use: No   Sexual activity: Yes  Other Topics Concern   Not on file  Social History Narrative   Not on file   Social Determinants of Health   Financial Resource Strain: Not on file  Food Insecurity: Not on file  Transportation Needs: Not on file  Physical Activity: Not on file  Stress: Not on file  Social Connections: Not on file  Intimate Partner Violence: Not on file    No outpatient medications have been marked as taking for the 04/05/22 encounter (Appointment) with Taylor Frederick, Taylor Evener, PA-C.      ROS:  Review of Systems  Constitutional:  Negative for fatigue, fever and unexpected weight change.  Respiratory:  Negative for cough, shortness of breath and wheezing.   Cardiovascular:  Negative for chest pain, palpitations and leg swelling.  Gastrointestinal:  Negative for blood in stool, constipation, diarrhea, nausea and vomiting.  Endocrine: Negative for cold intolerance, heat intolerance and polyuria.  Genitourinary:  Positive for dyspareunia. Negative for dysuria, flank pain, frequency, genital sores, hematuria, menstrual problem, pelvic pain, urgency, vaginal bleeding, vaginal discharge and vaginal pain.  Musculoskeletal:  Negative for back pain, joint swelling and myalgias.  Skin:  Negative for rash.  Neurological:  Negative for dizziness, syncope, light-headedness, numbness and headaches.  Hematological:  Negative for adenopathy.  Psychiatric/Behavioral:  Negative for agitation, confusion, sleep disturbance and suicidal ideas. The patient is not nervous/anxious.      Objective: LMP 08/16/2014    Physical Exam Constitutional:      Appearance: She is well-developed.  Genitourinary:     Vulva normal.     Genitourinary Comments: EXT VAGINAL ATROPHY/PALE TISSUE; NOW HAS RUBBERY THICK AREAS POST FOURCHETTE, C/W WITH LS; NO FISSURES     Right Labia: rash.     Right  Labia: No tenderness or lesions.    Left Labia: rash.     Left Labia: No tenderness or lesions.    No vaginal discharge, erythema or tenderness.      Right Adnexa: not tender and no mass present.    Left Adnexa: not tender and no mass present.    No cervical motion tenderness, friability or polyp.     Uterus is not enlarged or tender.  Rectum:     Guaiac result negative.     No anal fissure or tenderness.  Breasts:    Right: No mass,  nipple discharge, skin change or tenderness.     Left: No mass, nipple discharge, skin change or tenderness.  Neck:     Thyroid: No thyromegaly.  Cardiovascular:     Rate and Rhythm: Normal rate and regular rhythm.     Heart sounds: Normal heart sounds. No murmur heard. Pulmonary:     Effort: Pulmonary effort is normal.     Breath sounds: Normal breath sounds.  Abdominal:     Palpations: Abdomen is soft.     Tenderness: There is no abdominal tenderness. There is no guarding or rebound.  Musculoskeletal:        General: Normal range of motion.     Cervical back: Normal range of motion.  Lymphadenopathy:     Cervical: No cervical adenopathy.  Neurological:     General: No focal deficit present.     Mental Status: She is alert and oriented to person, place, and time.     Cranial Nerves: No cranial nerve deficit.  Skin:    General: Skin is warm and dry.  Psychiatric:        Mood and Affect: Mood normal.        Behavior: Behavior normal.        Thought Content: Thought content normal.        Judgment: Judgment normal.  Vitals reviewed.     Assessment/Plan: Encounter for annual routine gynecological examination  Cervical cancer screening - Plan: Cytology - PAP  Screening for HPV (human papillomavirus) - Plan: Cytology - PAP  Encounter for screening mammogram for malignant neoplasm of breast - Plan: MM 3D SCREEN BREAST BILATERAL; pt to sched mammo  Family history of breast cancer - Plan: MM 3D SCREEN BREAST BILATERAL; MyRisk testing  discussed and pt to f/u if desires.  Screening for colon cancer--pt will research where to do colonoscopy and call me for ref. Doesn't want to go to [facility] for procedure.  Lichen sclerosus--resume clobetasol QHS for 4 wks, then QOHS for 4 wks, then once wkly as maintenance. Discussed need for yearly check due to slight increase risk of SCC in untreated LS. Has enough Rx crm.   Hypothyroidism, unspecified type - Plan: TSH, T4, free; labs today, will RF based on results. Sent to [facility] per pt.   WILL CHECK fasting LIPIDS at 6 mo f/u lab appt for thyroid.          GYN counsel breast self exam, mammography screening, menopause, adequate intake of calcium and vitamin D, diet and exercise    F/U  No follow-ups on file.  Daja Shuping B. Josie Mesa, PA-C 04/04/2022 8:53 PM

## 2022-04-05 ENCOUNTER — Ambulatory Visit (INDEPENDENT_AMBULATORY_CARE_PROVIDER_SITE_OTHER): Payer: BC Managed Care – PPO | Admitting: Obstetrics and Gynecology

## 2022-04-05 ENCOUNTER — Encounter: Payer: Self-pay | Admitting: Obstetrics and Gynecology

## 2022-04-05 VITALS — Ht 63.0 in | Wt 190.0 lb

## 2022-04-05 DIAGNOSIS — Z803 Family history of malignant neoplasm of breast: Secondary | ICD-10-CM

## 2022-04-05 DIAGNOSIS — Z01419 Encounter for gynecological examination (general) (routine) without abnormal findings: Secondary | ICD-10-CM | POA: Diagnosis not present

## 2022-04-05 DIAGNOSIS — Z713 Dietary counseling and surveillance: Secondary | ICD-10-CM | POA: Diagnosis not present

## 2022-04-05 DIAGNOSIS — Z13220 Encounter for screening for lipoid disorders: Secondary | ICD-10-CM

## 2022-04-05 DIAGNOSIS — Z131 Encounter for screening for diabetes mellitus: Secondary | ICD-10-CM

## 2022-04-05 DIAGNOSIS — Z1211 Encounter for screening for malignant neoplasm of colon: Secondary | ICD-10-CM

## 2022-04-05 DIAGNOSIS — L900 Lichen sclerosus et atrophicus: Secondary | ICD-10-CM

## 2022-04-05 DIAGNOSIS — Z1231 Encounter for screening mammogram for malignant neoplasm of breast: Secondary | ICD-10-CM

## 2022-04-05 DIAGNOSIS — E039 Hypothyroidism, unspecified: Secondary | ICD-10-CM

## 2022-04-05 DIAGNOSIS — Z0000 Encounter for general adult medical examination without abnormal findings: Secondary | ICD-10-CM

## 2022-04-05 MED ORDER — CLOBETASOL PROPIONATE 0.05 % EX OINT: TOPICAL_OINTMENT | CUTANEOUS | 1 refills | Status: DC

## 2022-04-05 MED ORDER — PHENTERMINE HCL 37.5 MG PO TABS: 37.5000 mg | ORAL_TABLET | Freq: Every day | ORAL | 1 refills | Status: DC

## 2022-04-05 MED ORDER — SYNTHROID 100 MCG PO TABS: 100.0000 ug | ORAL_TABLET | Freq: Every day | ORAL | 0 refills | Status: DC

## 2022-04-05 NOTE — Patient Instructions (Signed)
I value your feedback and you entrusting us with your care. If you get a [facility] patient survey, I would appreciate you taking the time to let us know about your experience today. Thank you!  [facility]: [phone]      

## 2022-04-06 ENCOUNTER — Inpatient Hospital Stay: Payer: BC Managed Care – PPO

## 2022-04-06 ENCOUNTER — Inpatient Hospital Stay: Payer: BC Managed Care – PPO | Admitting: Internal Medicine

## 2022-04-06 LAB — COMPREHENSIVE METABOLIC PANEL
ALT: 13 IU/L (ref 0–32)
AST: 17 IU/L (ref 0–40)
Albumin/Globulin Ratio: 1.8 (ref 1.2–2.2)
Albumin: 4.2 g/dL (ref 3.8–4.9)
Alkaline Phosphatase: 97 IU/L (ref 44–121)
BUN/Creatinine Ratio: 13 (ref 12–28)
BUN: 12 mg/dL (ref 8–27)
Bilirubin Total: <0.2 mg/dL (ref 0.0–1.2)
CO2: 24 mmol/L (ref 20–29)
Calcium: 9.2 mg/dL (ref 8.7–10.3)
Chloride: 100 mmol/L (ref 96–106)
Creatinine, Ser: 0.94 mg/dL (ref 0.57–1.00)
Globulin, Total: 2.3 g/dL (ref 1.5–4.5)
Glucose: 85 mg/dL (ref 70–99)
Potassium: 3.9 mmol/L (ref 3.5–5.2)
Sodium: 139 mmol/L (ref 134–144)
Total Protein: 6.5 g/dL (ref 6.0–8.5)
eGFR: 69 mL/min/{1.73_m2} (ref >59)

## 2022-04-06 LAB — HEMOGLOBIN A1C
Est. average glucose Bld gHb Est-mCnc: 126 mg/dL
Hgb A1c MFr Bld: 6 % — ABNORMAL HIGH (ref 4.8–5.6)

## 2022-04-08 NOTE — Progress Notes (Signed)
Pls let pt know she has pre-DM which makes it easier to gain wt/harder to lose. Do phentermine/wt watchers but try to do low carb as this will help with wt loss due to pre-DM. Wt loss with improve these values.  Thx

## 2022-04-11 ENCOUNTER — Inpatient Hospital Stay (HOSPITAL_BASED_OUTPATIENT_CLINIC_OR_DEPARTMENT_OTHER): Payer: BC Managed Care – PPO | Admitting: Internal Medicine

## 2022-04-11 ENCOUNTER — Encounter: Payer: Self-pay | Admitting: Internal Medicine

## 2022-04-11 ENCOUNTER — Inpatient Hospital Stay: Payer: BC Managed Care – PPO | Attending: Internal Medicine

## 2022-04-11 VITALS — BP 125/82 | HR 73 | Temp 97.5°F | Resp 16 | Wt 187.7 lb

## 2022-04-11 DIAGNOSIS — Z803 Family history of malignant neoplasm of breast: Secondary | ICD-10-CM

## 2022-04-11 DIAGNOSIS — Z86718 Personal history of other venous thrombosis and embolism: Secondary | ICD-10-CM | POA: Insufficient documentation

## 2022-04-11 DIAGNOSIS — I82502 Chronic embolism and thrombosis of unspecified deep veins of left lower extremity: Secondary | ICD-10-CM

## 2022-04-11 DIAGNOSIS — Z1231 Encounter for screening mammogram for malignant neoplasm of breast: Secondary | ICD-10-CM

## 2022-04-11 DIAGNOSIS — M7989 Other specified soft tissue disorders: Secondary | ICD-10-CM | POA: Diagnosis not present

## 2022-04-11 DIAGNOSIS — Z7901 Long term (current) use of anticoagulants: Secondary | ICD-10-CM | POA: Insufficient documentation

## 2022-04-11 DIAGNOSIS — Z86711 Personal history of pulmonary embolism: Secondary | ICD-10-CM | POA: Insufficient documentation

## 2022-04-11 LAB — COMPREHENSIVE METABOLIC PANEL
ALT: 16 U/L (ref 0–44)
AST: 19 U/L (ref 15–41)
Albumin: 4.1 g/dL (ref 3.5–5.0)
Alkaline Phosphatase: 93 U/L (ref 38–126)
Anion gap: 8 (ref 5–15)
BUN: 12 mg/dL (ref 6–20)
CO2: 27 mmol/L (ref 22–32)
Calcium: 8.9 mg/dL (ref 8.9–10.3)
Chloride: 101 mmol/L (ref 98–111)
Creatinine, Ser: 0.92 mg/dL (ref 0.44–1.00)
GFR, Estimated: >60 mL/min (ref >60)
Glucose, Bld: 95 mg/dL (ref 70–99)
Potassium: 3.9 mmol/L (ref 3.5–5.1)
Sodium: 136 mmol/L (ref 135–145)
Total Bilirubin: 0.6 mg/dL (ref 0.3–1.2)
Total Protein: 7.6 g/dL (ref 6.5–8.1)

## 2022-04-11 LAB — CBC WITH DIFFERENTIAL/PLATELET
Abs Immature Granulocytes: 0.01 10*3/uL (ref 0.00–0.07)
Basophils Absolute: 0.1 10*3/uL (ref 0.0–0.1)
Basophils Relative: 1 %
Eosinophils Absolute: 0.1 10*3/uL (ref 0.0–0.5)
Eosinophils Relative: 1 %
HCT: 42.6 % (ref 36.0–46.0)
Hemoglobin: 14.1 g/dL (ref 12.0–15.0)
Immature Granulocytes: 0 %
Lymphocytes Relative: 26 %
Lymphs Abs: 1.7 10*3/uL (ref 0.7–4.0)
MCH: 29.4 pg (ref 26.0–34.0)
MCHC: 33.1 g/dL (ref 30.0–36.0)
MCV: 88.9 fL (ref 80.0–100.0)
Monocytes Absolute: 0.3 10*3/uL (ref 0.1–1.0)
Monocytes Relative: 5 %
Neutro Abs: 4.5 10*3/uL (ref 1.7–7.7)
Neutrophils Relative %: 67 %
Platelets: 381 10*3/uL (ref 150–400)
RBC: 4.79 MIL/uL (ref 3.87–5.11)
RDW: 12.9 % (ref 11.5–15.5)
WBC: 6.7 10*3/uL (ref 4.0–10.5)
nRBC: 0 % (ref 0.0–0.2)

## 2022-04-11 NOTE — Progress Notes (Signed)
[facility] CONSULT NOTE  Patient Care Team: Copland, Ginette Otto as PCP - General (Obstetrics and Gynecology) [name], MD as Consulting Physician (General Surgery) Cammie Sickle, MD as Consulting Physician (Oncology)  CHIEF COMPLAINTS/PURPOSE OF CONSULTATION:   # 2003- DVT LLE (behind Knee) [? 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; Superficial superficial vein- Left Medial LLE- started on Lovenox 80mg  BID; June 2017- On Eliquis BID  HISTORY OF PRESENTING ILLNESS: Alone.  Ambulating independently.  Virgina Jock Frederick 62 y.o.  female above history of recurrent DVT; [name]  Denies any blood in stools or black or stools.  Chronic mild swelling of the legs.  Intermittently using compression stockings.  Review of Systems  Constitutional:  Positive for malaise/fatigue. Negative for chills, diaphoresis, fever and weight loss.  HENT:  Negative for nosebleeds and sore throat.   Eyes:  Negative for double vision.  Respiratory:  Negative for cough, hemoptysis, sputum production, shortness of breath and wheezing.   Cardiovascular:  Positive for leg swelling. Negative for chest pain, palpitations and orthopnea.  Gastrointestinal:  Negative for abdominal pain, blood in stool, constipation, diarrhea, heartburn, melena, nausea and vomiting.  Genitourinary:  Negative for dysuria, frequency and urgency.  Musculoskeletal:  Negative for back pain.  Skin: Negative.  Negative for itching and rash.  Neurological:  Negative for dizziness, tingling, focal weakness, weakness and headaches.   Endo/Heme/Allergies:  Does not bruise/bleed easily.  Psychiatric/Behavioral:  Negative for depression. The patient is not nervous/anxious and does not have insomnia.    MEDICAL HISTORY:  Past Medical History:  Diagnosis Date   Asymptomatic varicose veins    Diffuse cystic mastopathy    DVT (deep venous thrombosis) ([place]) 2003   With BCP's   Edema of both legs    Hypothyroidism    Venous insufficiency     SURGICAL HISTORY: Past Surgical History:  Procedure Laterality Date   DILATION AND CURETTAGE OF UTERUS  ~2003   LEG SURGERY  01/2015   ORIF ANKLE FRACTURE Left 01/31/2015   Procedure: OPEN REDUCTION INTERNAL FIXATION (ORIF) DISTAL TIBIA  FRACTURE;  Surgeon: Corky Mull, MD;  [name];  Service: Orthopedics;  Laterality: Left;   PERIPHERAL VASCULAR CATHETERIZATION N/A 02/24/2015   Procedure: IVC Filter Insertion;  Surgeon: Algernon Huxley, MD;  Location: [facility];  Service: Cardiovascular;  [name];   PERIPHERAL VASCULAR CATHETERIZATION N/A 06/20/2015   Procedure: IVC Filter Removal;  Surgeon: Algernon Huxley, MD;  Location: [facility];  Service: Cardiovascular;  [name];   TUBAL LIGATION  2005   vein closure Left 2008   Left Leg   Vein Procedure     Dr. Jamal Collin    SOCIAL HISTORY: Social History   Socioeconomic History   Marital status: Married    Spouse name: Not on file   Number of children: 1   Years of education: Not on file   Highest education level: Not on file  Occupational History   Occupation: Glass blower/designer    Comment: Educational psychologist   Occupation:  Comment:    Tobacco Use   Smoking status: Never   Smokeless tobacco: Never  Vaping Use   Vaping Use: Never used  Substance and Sexual Activity   Alcohol use: No   Drug use: No   Sexual activity: Yes  Other Topics Concern   Not on file  Social History Narrative   Not on file   Social Determinants of Health   Financial Resource Strain: Not on file  Food  Insecurity: Not on file  Transportation Needs: Not on file  Physical Activity: Not on file  Stress: Not on file  Social Connections: Not on file  Intimate Partner Violence: Not on file    FAMILY HISTORY: Family History  Problem Relation Age of Onset   Heart disease Mother        CHF   Diabetes Mother    Hypothyroidism Mother    Cancer Father        stomach   Cervical cancer Sister        stage 3   Breast cancer Maternal Aunt    Cancer Maternal Aunt        Breast and Ovarian  (Believes BRCA positive)   Cancer Maternal Aunt        Melanoma   Breast cancer Other 8       1sr cousin   Cancer Other        Breast CA (Believes BRCA positive) (mom had br/ovar ca)    ALLERGIES:  is allergic to estrogens.  MEDICATIONS:  Current Outpatient Medications  Medication Sig Dispense Refill   cholecalciferol (VITAMIN D3) 25 MCG (1000 UNIT) tablet Take 1,000 Units by mouth daily.     clobetasol ointment (TEMOVATE) 0.05 % Apply to affected area once weekly as maintenance 30 g 1   ELIQUIS 5 MG TABS tablet TAKE 1 TABLET TWICE A DAY 180 tablet 3   phentermine (ADIPEX-P) 37.5 MG tablet Take 1 tablet (37.5 mg total) by mouth daily before breakfast. 30 tablet 1   SYNTHROID 100 MCG tablet Take 1 tablet (100 mcg total) by mouth daily before breakfast. BRAND NECESSARY 90 tablet 0   No current facility-administered medications for this visit.      Marland Kitchen  PHYSICAL EXAMINATION:   Vitals:   04/11/22 0800  BP: 125/82  Pulse: 73  Resp: 16  Temp: (!) 97.5 F (36.4 C)   Filed Weights   04/11/22 0800  Weight: 187 lb 11.2 oz (85.1 kg)    Physical Exam HENT:     Head: Normocephalic and atraumatic.     Mouth/Throat:     Pharynx: No oropharyngeal exudate.  Eyes:     Pupils: Pupils are equal, round, and reactive to light.  Cardiovascular:     Rate and Rhythm: Normal rate and regular rhythm.  Pulmonary:     Effort: Pulmonary effort is normal. No respiratory distress.     Breath sounds:  Normal breath sounds. No wheezing.  Abdominal:     General: Bowel sounds are normal. There is no distension.     Palpations: Abdomen is soft. There is no mass.     Tenderness: There is no abdominal tenderness. There is no guarding or rebound.  Musculoskeletal:        General: No tenderness. Normal range of motion.     Cervical back: Normal range of motion and neck supple.  Skin:    General: Skin is warm.  Neurological:     Mental Status: She is alert and oriented to person, place,  and time.  Psychiatric:        Mood and Affect: Affect normal.     LABORATORY DATA:  I have reviewed the data as listed Lab Results  Component Value Date   WBC 6.7 04/11/2022   HGB 14.1 04/11/2022   HCT 42.6 04/11/2022   MCV 88.9 04/11/2022   PLT 381 04/11/2022   Recent Labs    07/24/21 1314 04/05/22 1421 04/11/22 0821  NA 136 139 136  K 3.5 3.9 3.9  CL 103 100 101  CO2 28 24 27   GLUCOSE 104* 85 95  BUN 11 12 12   CREATININE 0.71 0.94 0.92  CALCIUM 8.6* 9.2 8.9  GFRNONAA >60  --  >60  PROT 7.3 6.5 7.6  ALBUMIN 3.9 4.2 4.1  AST 18 17 19   ALT 17 13 16   ALKPHOS 84 97 93  BILITOT 0.5 <0.2 0.6    RADIOGRAPHIC STUDIES: I have personally reviewed the radiological images as listed and agreed with the findings in the report. No results found.  ASSESSMENT & PLAN:   Leg DVT (deep venous thromboembolism), chronic, left (HCC) #Recurrent DVT/[name] [with acquired risk factors limited mobility-ankle surgery].  On indefinite anticoagulation.  On Eliquis.  Stable.   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 Declines evaluation with vascular at this time.  Stable.   # Hypocalcemia-JAN 2024- 9.2 on vit D; recommend ca 500 mg BID.   # DISPOSITION: # follow up in 6 months- MD/labs- cbc/cmp-Dr.B    Cammie Sickle, MD 04/11/2022 9:13 AM

## 2022-04-11 NOTE — Progress Notes (Signed)
Patient denies new problems/concerns today.   

## 2022-04-11 NOTE — Assessment & Plan Note (Addendum)
#  Recurrent DVT/[name] [with acquired risk factors limited mobility-ankle surgery].  On indefinite anticoagulation.  On Eliquis.  Stable.   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 Declines evaluation with vascular at this time.  Stable.   # Hypocalcemia-JAN 2024- 9.2 on vit D; recommend ca 500 mg BID.   # DISPOSITION: # follow up in 6 months- MD/labs- cbc/cmp-Dr.B

## 2022-04-17 ENCOUNTER — Ambulatory Visit: Payer: BC Managed Care – PPO | Admitting: Family Medicine

## 2022-04-17 ENCOUNTER — Encounter: Payer: Self-pay | Admitting: Family Medicine

## 2022-04-17 VITALS — BP 118/70 | HR 77 | Temp 97.6°F | Ht 61.75 in | Wt 184.0 lb

## 2022-04-17 DIAGNOSIS — Z803 Family history of malignant neoplasm of breast: Secondary | ICD-10-CM

## 2022-04-17 DIAGNOSIS — E039 Hypothyroidism, unspecified: Secondary | ICD-10-CM

## 2022-04-17 DIAGNOSIS — D6851 Activated protein C resistance: Secondary | ICD-10-CM

## 2022-04-17 DIAGNOSIS — L900 Lichen sclerosus et atrophicus: Secondary | ICD-10-CM

## 2022-04-17 DIAGNOSIS — R600 Localized edema: Secondary | ICD-10-CM

## 2022-04-17 DIAGNOSIS — Z833 Family history of diabetes mellitus: Secondary | ICD-10-CM

## 2022-04-17 DIAGNOSIS — Z13220 Encounter for screening for lipoid disorders: Secondary | ICD-10-CM

## 2022-04-17 DIAGNOSIS — I82502 Chronic embolism and thrombosis of unspecified deep veins of left lower extremity: Secondary | ICD-10-CM

## 2022-04-17 DIAGNOSIS — E559 Vitamin D deficiency, unspecified: Secondary | ICD-10-CM

## 2022-04-17 NOTE — Assessment & Plan Note (Signed)
Followed by GYN on clonetasol.

## 2022-04-17 NOTE — Progress Notes (Signed)
Patient ID: Taylor Frederick, female    DOB: 1960/03/20, 62 y.o.   MRN: [id number]  This visit was conducted in person.  BP 118/70   Pulse 77   Temp 97.6 F (36.4 C) (Temporal)   Ht 5' 1.75" (1.568 m)   Wt 184 lb (83.5 kg)   LMP 08/16/2014   SpO2 97%   BMI 33.93 kg/m    CC:  Chief Complaint  Patient presents with   Establish Care    Subjective:   HPI: Taylor Frederick is a 62 y.o. female presenting on 04/17/2022 for Establish Care   GYN: Dr. Lorelei Pont  Last PCP: Jefm Bryant  Last CPX: 04/05/22    Hx of DVT  ( 2003 when on OCPs), repeat DVT ( 2026 after broken leg), repeat DVT and PE in  2017  Dx Factor 5 Leiden   On Eliquis 5 mg BID Followed by Dr. Lucky Cowboy vascular and Dr. Burlene Arnt Hematology.   Reviewed Dr. Andre Lefort last OV from 04/11/2022  Hypothyroidism On synthroid 100  mcg daily. Lab Results  Component Value Date   TSH 3.210 03/08/2022   Vit D: on supplement daily.   Started on phentermine last week for weihgt loss.   Relevant past medical, surgical, family and social history reviewed and updated as indicated. Interim medical history since our last visit reviewed. Allergies and medications reviewed and updated. Outpatient Medications Prior to Visit  Medication Sig Dispense Refill   cholecalciferol (VITAMIN D3) 25 MCG (1000 UNIT) tablet Take 1,000 Units by mouth daily.     clobetasol ointment (TEMOVATE) 0.05 % Apply to affected area once weekly as maintenance 30 g 1   ELIQUIS 5 MG TABS tablet TAKE 1 TABLET TWICE A DAY 180 tablet 3   phentermine (ADIPEX-P) 37.5 MG tablet Take 1 tablet (37.5 mg total) by mouth daily before breakfast. 30 tablet 1   SYNTHROID 100 MCG tablet Take 1 tablet (100 mcg total) by mouth daily before breakfast. BRAND NECESSARY 90 tablet 0   No facility-administered medications prior to visit.     Per HPI unless specifically indicated in ROS section below Review of Systems  Constitutional:  Negative for fatigue and fever.  HENT:   Negative for congestion and ear pain.   Eyes:  Negative for pain.  Respiratory:  Negative for cough, chest tightness and shortness of breath.   Cardiovascular:  Positive for leg swelling. Negative for chest pain and palpitations.  Gastrointestinal:  Negative for abdominal pain.  Genitourinary:  Negative for dysuria and vaginal bleeding.  Musculoskeletal:  Negative for back pain.  Neurological:  Negative for syncope, light-headedness and headaches.  Psychiatric/Behavioral:  Negative for dysphoric mood.    Objective:  BP 118/70   Pulse 77   Temp 97.6 F (36.4 C) (Temporal)   Ht 5' 1.75" (1.568 m)   Wt 184 lb (83.5 kg)   LMP 08/16/2014   SpO2 97%   BMI 33.93 kg/m   Wt Readings from Last 3 Encounters:  04/17/22 184 lb (83.5 kg)  04/11/22 187 lb 11.2 oz (85.1 kg)  04/05/22 190 lb (86.2 kg)      Physical Exam Constitutional:      General: She is not in acute distress.    Appearance: Normal appearance. She is well-developed. She is not ill-appearing or toxic-appearing.  HENT:     Head: Normocephalic.     Right Ear: Hearing, tympanic membrane, ear canal and external ear normal. Tympanic membrane is not erythematous, retracted or bulging.  Left Ear: Hearing, tympanic membrane, ear canal and external ear normal. Tympanic membrane is not erythematous, retracted or bulging.     Nose: No mucosal edema or rhinorrhea.     Right Sinus: No maxillary sinus tenderness or frontal sinus tenderness.     Left Sinus: No maxillary sinus tenderness or frontal sinus tenderness.     Mouth/Throat:     Pharynx: Uvula midline.  Eyes:     General: Lids are normal. Lids are everted, no foreign bodies appreciated.     Conjunctiva/sclera: Conjunctivae normal.     Pupils: Pupils are equal, round, and reactive to light.  Neck:     Thyroid: No thyroid mass or thyromegaly.     Vascular: No carotid bruit.     Trachea: Trachea normal.  Cardiovascular:     Rate and Rhythm: Normal rate and regular rhythm.      Pulses: Normal pulses.     Heart sounds: Normal heart sounds, S1 normal and S2 normal. No murmur heard.    No friction rub. No gallop.     Comments: Bilateral varicose veins Pulmonary:     Effort: Pulmonary effort is normal. No tachypnea or respiratory distress.     Breath sounds: Normal breath sounds. No decreased breath sounds, wheezing, rhonchi or rales.  Abdominal:     General: Bowel sounds are normal.     Palpations: Abdomen is soft.     Tenderness: There is no abdominal tenderness.  Musculoskeletal:     Cervical back: Normal range of motion and neck supple.     Right lower leg: Edema present.     Left lower leg: Edema present.  Skin:    General: Skin is warm and dry.     Findings: No rash.  Neurological:     Mental Status: She is alert.  Psychiatric:        Mood and Affect: Mood is not anxious or depressed.        Speech: Speech normal.        Behavior: Behavior normal. Behavior is cooperative.        Thought Content: Thought content normal.        Judgment: Judgment normal.       Results for orders placed or performed in visit on 04/11/22  Comprehensive metabolic panel  Result Value Ref Range   Sodium 136 135 - 145 mmol/L   Potassium 3.9 3.5 - 5.1 mmol/L   Chloride 101 98 - 111 mmol/L   CO2 27 22 - 32 mmol/L   Glucose, Bld 95 70 - 99 mg/dL   BUN 12 6 - 20 mg/dL   Creatinine, Ser 0.92 0.44 - 1.00 mg/dL   Calcium 8.9 8.9 - 10.3 mg/dL   Total Protein 7.6 6.5 - 8.1 g/dL   Albumin 4.1 3.5 - 5.0 g/dL   AST 19 15 - 41 U/L   ALT 16 0 - 44 U/L   Alkaline Phosphatase 93 38 - 126 U/L   Total Bilirubin 0.6 0.3 - 1.2 mg/dL   GFR, Estimated >60 >60 mL/min   Anion gap 8 5 - 15  CBC with Differential/Platelet  Result Value Ref Range   WBC 6.7 4.0 - 10.5 K/uL   RBC 4.79 3.87 - 5.11 MIL/uL   Hemoglobin 14.1 12.0 - 15.0 g/dL   HCT 42.6 36.0 - 46.0 %   MCV 88.9 80.0 - 100.0 fL   MCH 29.4 26.0 - 34.0 pg   MCHC 33.1 30.0 - 36.0 g/dL   RDW 12.9  11.5 - 15.5 %   Platelets  381 150 - 400 K/uL   nRBC 0.0 0.0 - 0.2 %   Neutrophils Relative % 67 %   Neutro Abs 4.5 1.7 - 7.7 K/uL   Lymphocytes Relative 26 %   Lymphs Abs 1.7 0.7 - 4.0 K/uL   Monocytes Relative 5 %   Monocytes Absolute 0.3 0.1 - 1.0 K/uL   Eosinophils Relative 1 %   Eosinophils Absolute 0.1 0.0 - 0.5 K/uL   Basophils Relative 1 %   Basophils Absolute 0.1 0.0 - 0.1 K/uL   Immature Granulocytes 0 %   Abs Immature Granulocytes 0.01 0.00 - 0.07 K/uL    Assessment and Plan  Factor V Leiden mutation [facility]) Assessment & Plan: Hx of DVT  ( 2003 when on OCPs), repeat DVT ( 2026 after broken leg), repeat DVT and PE in  2017  Dx Factor 5 Leiden   On Eliquis 5 mg BID Followed by Dr. Lucky Cowboy vascular and Dr. Burlene Arnt Hematology.   Leg DVT (deep venous thromboembolism), chronic, left (HCC) Assessment & Plan:  Followed by Dr. Jacinto Reap heme.  Encouraged compression hose.   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 encouraged to wear compression hose.   Hypothyroidism, unspecified type Assessment & Plan: Stable, chronic.  Continue current medication.  Synthorid 100 mcg daily, Brand name only.   Followed by GYN in past.. if okay with GYN I will take over  management of this.  Orders: -     TSH; Future -     T4, free; Future -     T3, free; Future  Family history of breast cancer Assessment & Plan:  Aunt with breast cancer, several cousins, no first degree relative with cancer   Lichen sclerosus Assessment & Plan:  Followed by GYN on clonetasol.   Screening cholesterol level -     Lipid panel; Future -     Comprehensive metabolic panel; Future  Vitamin D deficiency -     VITAMIN D 25 Hydroxy (Vit-D Deficiency, Fractures); Future  Family history of diabetes mellitus -     Hemoglobin A1c; Future    Return in about 6 months (around 10/16/2022) for anuual physical with fasting labs prior.   Eliezer Lofts, MD

## 2022-04-17 NOTE — Assessment & Plan Note (Signed)
Hx of DVT  ( 2003 when on OCPs), repeat DVT ( 2026 after broken leg), repeat DVT and PE in  2017  Dx Factor 5 Leiden   On Eliquis 5 mg BID Followed by Dr. Lucky Cowboy vascular and Dr. Burlene Arnt Hematology.

## 2022-04-17 NOTE — Assessment & Plan Note (Signed)
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 encouraged to wear compression hose.

## 2022-04-17 NOTE — Patient Instructions (Signed)
It ws nice meeting you today!  Keep working on increasing exercise and low carb diet.  Wear compression hose daily when up on your feet.

## 2022-04-17 NOTE — Assessment & Plan Note (Signed)
Followed by Dr. Jacinto Reap heme.  Encouraged compression hose.

## 2022-04-17 NOTE — Assessment & Plan Note (Addendum)
Stable, chronic.  Continue current medication.  Synthorid 100 mcg daily, Brand name only.   Followed by GYN in past.. if okay with GYN I will take over  management of this.

## 2022-04-17 NOTE — Assessment & Plan Note (Signed)
Aunt with breast cancer, several cousins, no first degree relative with cancer

## 2022-05-10 DIAGNOSIS — H6993 Unspecified Eustachian tube disorder, bilateral: Secondary | ICD-10-CM | POA: Diagnosis not present

## 2022-05-10 DIAGNOSIS — J019 Acute sinusitis, unspecified: Secondary | ICD-10-CM | POA: Diagnosis not present

## 2022-05-15 ENCOUNTER — Ambulatory Visit: Payer: BC Managed Care – PPO | Admitting: Family Medicine

## 2022-05-15 ENCOUNTER — Telehealth: Payer: Self-pay | Admitting: Family Medicine

## 2022-05-15 DIAGNOSIS — H9319 Tinnitus, unspecified ear: Secondary | ICD-10-CM | POA: Diagnosis not present

## 2022-05-15 DIAGNOSIS — R42 Dizziness and giddiness: Secondary | ICD-10-CM | POA: Diagnosis not present

## 2022-05-15 DIAGNOSIS — J323 Chronic sphenoidal sinusitis: Secondary | ICD-10-CM | POA: Diagnosis not present

## 2022-05-15 DIAGNOSIS — J0100 Acute maxillary sinusitis, unspecified: Secondary | ICD-10-CM | POA: Diagnosis not present

## 2022-05-15 DIAGNOSIS — R110 Nausea: Secondary | ICD-10-CM | POA: Diagnosis not present

## 2022-05-15 NOTE — Telephone Encounter (Signed)
I spoke with pt; pt said was seen at [facility] 05/10/22 and was given doxycycline 100 mg to take bid and mucinex. Pt said she is not feeling any better and is actually feeling worse. Pt said has been on abx 4-5 days. Pt said this morning pt was suddenly dizzy where room was spinning; pt got very hot and broke out in sweat;and pt vomited x 2. Pt had to hold to something so not to fall. Pt said has had H/A and pressure all over head with pain level now of 5 and earlier this morning pain level was more intense 8-9.pt said when stands, bends over or moves certain ways pt has major pain in rt side of head. Pt said still having rt ear pain, constant ringing in rt ear and cannot hear out of rt ear. Pt said this morning difficult to walk in straight line; pt leaning toward rt.pt does not have weakness in arms or legs and no CP or SOB. Pt did say in the past 2 wks; last time being last wk had 2 episodes of chest heaviness that lasted for short periods. Now BP 139/79 P 71.pt said she is really concerned that whatever is going on is not just sinus infection and has thought about going to ED since pt is no better after beng on abx pt decided to have her daughter take her to ED now and pt request appt today with Dr Diona Browner cancelled. I cancelled appt and pt will cb 1 - 2 days with update. Pt appreciative of call. Sending note to Dr Diona Browner.

## 2022-05-15 NOTE — Telephone Encounter (Signed)
Pt called stating she visited the UC on last Thursday, 3/8 & was diagnosed with sinusitis. Pt states she felt very dizzy, she was vommiting & feeling nauseous. Pt requested a appointment with [facility], scheduled pt for 3/12 @ 2. Pt declined triage, stated she call back to cancel appt if she visits the ER. Call back # [phone]

## 2022-05-15 NOTE — Telephone Encounter (Signed)
Noted  

## 2022-05-30 DIAGNOSIS — H8101 Meniere's disease, right ear: Secondary | ICD-10-CM | POA: Diagnosis not present

## 2022-05-30 DIAGNOSIS — H9041 Sensorineural hearing loss, unilateral, right ear, with unrestricted hearing on the contralateral side: Secondary | ICD-10-CM | POA: Diagnosis not present

## 2022-05-30 DIAGNOSIS — J309 Allergic rhinitis, unspecified: Secondary | ICD-10-CM | POA: Diagnosis not present

## 2022-05-30 DIAGNOSIS — J019 Acute sinusitis, unspecified: Secondary | ICD-10-CM | POA: Diagnosis not present

## 2022-05-30 DIAGNOSIS — J329 Chronic sinusitis, unspecified: Secondary | ICD-10-CM | POA: Diagnosis not present

## 2022-05-30 DIAGNOSIS — R0981 Nasal congestion: Secondary | ICD-10-CM | POA: Diagnosis not present

## 2022-06-11 ENCOUNTER — Encounter: Payer: Self-pay | Admitting: Nurse Practitioner

## 2022-06-11 ENCOUNTER — Ambulatory Visit: Payer: BC Managed Care – PPO | Admitting: Nurse Practitioner

## 2022-06-11 ENCOUNTER — Telehealth: Payer: Self-pay | Admitting: Nurse Practitioner

## 2022-06-11 VITALS — BP 122/64 | HR 60 | Temp 98.7°F | Resp 16 | Ht 61.75 in | Wt 170.0 lb

## 2022-06-11 DIAGNOSIS — T7840XA Allergy, unspecified, initial encounter: Secondary | ICD-10-CM | POA: Insufficient documentation

## 2022-06-11 MED ORDER — LEVOCETIRIZINE DIHYDROCHLORIDE 5 MG PO TABS: 5.0000 mg | ORAL_TABLET | Freq: Every evening | ORAL | 0 refills | Status: DC

## 2022-06-11 MED ORDER — FAMOTIDINE 20 MG PO TABS: 20.0000 mg | ORAL_TABLET | Freq: Two times a day (BID) | ORAL | 0 refills | Status: DC

## 2022-06-11 MED ORDER — METHYLPREDNISOLONE ACETATE 80 MG/ML IJ SUSP
80.0000 mg | Freq: Once | INTRAMUSCULAR | Status: AC
  Administered 2022-06-11: 80 mg via INTRAMUSCULAR

## 2022-06-11 NOTE — Progress Notes (Signed)
Acute Office Visit  Subjective:     Patient ID: Taylor Frederick, female    DOB: 1960/12/04, 62 y.o.   MRN: [id number]  Chief Complaint  Patient presents with   Allergic Reaction    Started yesterday     Patient is in today for allergic reaction with a history of DVT on anticoagulation, hypothyroidism, factor 5 Leiden, lichen sclerosus  States that it started yesterday. She has a rash that developed yesterday. States that she noticed it on her arms. States that it is on her arms, trunk, and legs. States that she was seen by ENT and place on prednisone and clindamycin. Staets that she did take benadrayl today. 25mg  tablet today with out great relief.    States that she has not taken any prednisone or clindamycin today.    Review of Systems  Constitutional:  Negative for chills and fever.  Respiratory:  Negative for shortness of breath.   Cardiovascular:  Negative for chest pain.  Gastrointestinal:  Positive for diarrhea. Negative for abdominal pain, nausea and vomiting.  Skin:  Positive for itching and rash.        Objective:    BP 122/64   Pulse 60   Temp 98.7 F (37.1 C)   Resp 16   Ht 5' 1.75" (1.568 m)   Wt 170 lb (77.1 kg)   LMP 08/16/2014   SpO2 99%   BMI 31.35 kg/m  BP Readings from Last 3 Encounters:  06/11/22 122/64  04/17/22 118/70  04/11/22 125/82   Wt Readings from Last 3 Encounters:  06/11/22 170 lb (77.1 kg)  04/17/22 184 lb (83.5 kg)  04/11/22 187 lb 11.2 oz (85.1 kg)      Physical Exam Vitals and nursing note reviewed.  Constitutional:      General: She is not in acute distress.    Appearance: Normal appearance.  HENT:     Mouth/Throat:     Mouth: Mucous membranes are moist.     Pharynx: Oropharynx is clear. Posterior oropharyngeal erythema present.  Cardiovascular:     Rate and Rhythm: Normal rate and regular rhythm.     Heart sounds: Normal heart sounds.  Pulmonary:     Effort: Pulmonary effort is normal.     Breath sounds:  Normal breath sounds.  Neurological:     Mental Status: She is alert.     No results found for any visits on 06/11/22.      Assessment & Plan:   Problem List Items Addressed This Visit       Other   Allergic reaction - Primary    Likely secondary to starting clindamycin.  Patient has not taken any clindamycin today.  She is no in no acute distress does have some pharyngeal erythema but no edema.  Oropharynx is wide open patient states she has been experiencing some postnasal discharge dealing with the sinus infection that she has been being treated for.  Administer Depo-Medrol 80 mg in office patient will take 40 mg of Pepcid when she gets home along with levocetirizine.  Patient was given strict signs and symptoms when to call 911 or seek emergent healthcare.       Relevant Medications   levocetirizine (XYZAL) 5 MG tablet   famotidine (PEPCID) 20 MG tablet    Meds ordered this encounter  Medications   methylPREDNISolone acetate (DEPO-MEDROL) injection 80 mg   levocetirizine (XYZAL) 5 MG tablet    Sig: Take 1 tablet (5 mg total) by mouth every evening.  Dispense:  30 tablet    Refill:  0    Order Specific Question:   Supervising Provider    Answer:   Roxy Manns A [[id number]]   famotidine (PEPCID) 20 MG tablet    Sig: Take 1 tablet (20 mg total) by mouth 2 (two) times daily.    Dispense:  20 tablet    Refill:  0    Order Specific Question:   Supervising Provider    Answer:   TOWER, MARNE A [[id number]    Return if symptoms worsen or fail to improve.  Audria Nine, NP

## 2022-06-11 NOTE — Telephone Encounter (Signed)
Called and checked on patient. She states she feels like the rash has spread some. Overall she feels the same as she did in office. She has taken pepcid and xyzal. She will not take any more clindamycin. She will start back on her prednisone from ENT tomorrow since we gave her depomedrol IM in office

## 2022-06-11 NOTE — Assessment & Plan Note (Signed)
Likely secondary to starting clindamycin.  Patient has not taken any clindamycin today.  She is no in no acute distress does have some pharyngeal erythema but no edema.  Oropharynx is wide open patient states she has been experiencing some postnasal discharge dealing with the sinus infection that she has been being treated for.  Administer Depo-Medrol 80 mg in office patient will take 40 mg of Pepcid when she gets home along with levocetirizine.  Patient was given strict signs and symptoms when to call 911 or seek emergent healthcare.

## 2022-06-11 NOTE — Patient Instructions (Signed)
Nice to see you today Take 2 tablets of the pepcid when you get home Take 1 of the allergy pills when you get home  If you get to have trouble swallowing, shortness of breath, swelling to the lips or face, scratchy tight throat go to the nearest emergency department or call 911

## 2022-06-12 ENCOUNTER — Emergency Department
Admission: EM | Admit: 2022-06-12 | Discharge: 2022-06-12 | Disposition: A | Discharge status: Home / Self Care | Payer: BC Managed Care – PPO | Attending: Student in an Organized Health Care Education/Training Program | Admitting: Student in an Organized Health Care Education/Training Program

## 2022-06-12 ENCOUNTER — Other Ambulatory Visit: Payer: Self-pay

## 2022-06-12 DIAGNOSIS — L270 Generalized skin eruption due to drugs and medicaments taken internally: Secondary | ICD-10-CM | POA: Diagnosis not present

## 2022-06-12 DIAGNOSIS — Z7901 Long term (current) use of anticoagulants: Secondary | ICD-10-CM | POA: Diagnosis not present

## 2022-06-12 DIAGNOSIS — L299 Pruritus, unspecified: Secondary | ICD-10-CM | POA: Diagnosis not present

## 2022-06-12 DIAGNOSIS — R21 Rash and other nonspecific skin eruption: Secondary | ICD-10-CM | POA: Diagnosis not present

## 2022-06-12 DIAGNOSIS — E039 Hypothyroidism, unspecified: Secondary | ICD-10-CM | POA: Insufficient documentation

## 2022-06-12 MED ORDER — METHYLPREDNISOLONE SODIUM SUCC 125 MG IJ SOLR
125.0000 mg | Freq: Once | INTRAMUSCULAR | Status: AC
  Administered 2022-06-12: 125 mg via INTRAVENOUS
  Filled 2022-06-12: qty 2

## 2022-06-12 MED ORDER — SODIUM CHLORIDE 0.9 % IV BOLUS
1000.0000 mL | Freq: Once | INTRAVENOUS | Status: AC
  Administered 2022-06-12: 1000 mL via INTRAVENOUS

## 2022-06-12 MED ORDER — PREDNISONE 10 MG PO TABS: 10.0000 mg | ORAL_TABLET | Freq: Every day | ORAL | 0 refills | Status: DC

## 2022-06-12 MED ORDER — DIPHENHYDRAMINE HCL 50 MG/ML IJ SOLN
50.0000 mg | Freq: Once | INTRAMUSCULAR | Status: AC
  Administered 2022-06-12: 50 mg via INTRAVENOUS
  Filled 2022-06-12: qty 1

## 2022-06-12 MED ORDER — FAMOTIDINE IN NACL 20-0.9 MG/50ML-% IV SOLN
20.0000 mg | Freq: Once | INTRAVENOUS | Status: AC
  Administered 2022-06-12: 20 mg via INTRAVENOUS
  Filled 2022-06-12: qty 50

## 2022-06-12 MED ORDER — HYDROXYZINE HCL 25 MG PO TABS: 25.0000 mg | ORAL_TABLET | Freq: Three times a day (TID) | ORAL | 0 refills | Status: DC | PRN

## 2022-06-12 MED ORDER — HYDROXYZINE HCL 25 MG PO TABS
50.0000 mg | ORAL_TABLET | Freq: Once | ORAL | Status: AC
  Administered 2022-06-12: 50 mg via ORAL
  Filled 2022-06-12: qty 2

## 2022-06-12 NOTE — ED Provider Notes (Signed)
[facility] Provider Note    Event Date/Time   First MD Initiated Contact with Patient 06/12/22 [phone]     (approximate)  History   Chief Complaint: Allergic Reaction  HPI  Taylor Frederick is a 62 y.o. female with a past medical history of hypothyroidism, prior DVT on Eliquis who presents to the emergency department for an allergic reaction.  According to the patient for the past month or so she has been experiencing sinus congestion and pressure has been diagnosed with sinusitis.  Initially saw her doctor was placed on doxycycline and prednisone, was then seen again by her doctor placed on Augmentin.  Last week she was seen by ENT and they confirmed sinusitis patient was switched to clindamycin and continued on prednisone.  Patient states on Saturday she began experiencing itching and hives on her extremities which have become more severe and intense have now spread to her chest and back.  This morning patient noted some swelling to her lips was concerned so she came to the emergency department for evaluation.  Patient states on Sunday she stopped all of her medications including the clindamycin and prednisone.  Patient denies any history of allergic reactions previously.  Physical Exam   Triage Vital Signs: ED Triage Vitals  Enc Vitals Group     BP 06/12/22 0806 134/80     Pulse Rate 06/12/22 0806 (!) 103     Resp 06/12/22 0806 16     Temp 06/12/22 0806 98.4 F (36.9 C)     Temp Source 06/12/22 0806 Oral     SpO2 06/12/22 0806 98 %     Weight 06/12/22 0807 170 lb (77.1 kg)     Height 06/12/22 0807 5\' 1"  (1.549 m)     Head Circumference --      Peak Flow --      Pain Score 06/12/22 0807 0     Pain Loc --      Pain Edu? --      Excl. in GC? --     Most recent vital signs: Vitals:   06/12/22 0806  BP: 134/80  Pulse: (!) 103  Resp: 16  Temp: 98.4 F (36.9 C)  SpO2: 98%    General: Awake, no distress.  CV:  Good peripheral perfusion.  Regular  rate and rhythm  Resp:  Normal effort.  Equal breath sounds bilaterally.  No wheezes. Abd:  No distention.  Soft, nontender.  No rebound or guarding. Other:  No oral edema or oral lesions noted.  Does have hives over all of her extremities which coalesce into welts mostly over her upper chest and upper back as well as upper arms.  Patient does have several hives over the face and around the lips but no intraoral lesions noted.  Oropharynx appears widely patent.  No tongue swelling noted.   ED Results / Procedures / Treatments   MEDICATIONS ORDERED IN ED: Medications  famotidine (PEPCID) IVPB 20 mg premix (20 mg Intravenous New Bag/Given 06/12/22 0838)  methylPREDNISolone sodium succinate (SOLU-MEDROL) 125 mg/2 mL injection 125 mg (125 mg Intravenous Given 06/12/22 0836)  diphenhydrAMINE (BENADRYL) injection 50 mg (50 mg Intravenous Given 06/12/22 0836)  sodium chloride 0.9 % bolus 1,000 mL (1,000 mLs Intravenous New Bag/Given 06/12/22 0834)     IMPRESSION / MDM / ASSESSMENT AND PLAN / ED COURSE  I reviewed the triage vital signs and the nursing notes.  Patient's presentation is most consistent with acute presentation with potential threat to life  or bodily function.  Patient presents to the emergency department for likely allergic reaction.  Patient has undergone 3 courses of antibiotics over the past month as well as prednisone.  Patient states she stopped prednisone on Saturday but is 2 or 3 days into a 12-day taper.  We will dose IV Solu-Medrol, Benadryl, Pepcid, IV fluids.  Will continue to closely monitor.  Discussed with the patient to discontinue clindamycin.  I believe is much more likely that the allergic reaction was due to one of the patient's recent antibiotics clindamycin being the most recent and less likely related to prednisone.  Patient denies any other new medications or exposures.  Overall clinical picture is most consistent with exanthematous rash, possibly drug reaction or eruption  rash.  Patient states some reduction in itching of the rash appears largely unchanged.  Highly suspect drug eruption rash.  Discussed with the patient we will continue a 12-day course of prednisone as a taper.  Hydroxyzine to be used as needed for itching but we will stop all other medications including the antibiotics.  I discussed return precautions for any worsening rash especially any blistering of the rash or any involvement of her mucous membranes or mouth.  Patient agreeable to plan of care.  Will follow-up with her PCP.  FINAL CLINICAL IMPRESSION(S) / ED DIAGNOSES   Drug eruption rash  Note:  This document was prepared using Dragon voice recognition software and may include unintentional dictation errors.   Minna Antis, MD 06/12/22 1158

## 2022-06-12 NOTE — ED Notes (Signed)
Pt verbalizes understanding of discharge instructions. Opportunity for questioning and answers were provided. Pt discharged from ED to home with daughter.    

## 2022-06-12 NOTE — ED Triage Notes (Signed)
Pt states that she has been on 3 different antibiotics for the past month and a half, pt states that she had been on doxycycline, augmentin, clindamycin that she stopped on Sunday due to breaking out on a rash, pt states she was seen by her dr yesterday who placed her on two antihistamines and states that she is currently on prednisone. Pt states that her rash is worse, her throat feels tight, and now has swelling to her lips, pt reports that she has been up since 0200

## 2022-06-12 NOTE — ED Notes (Signed)
Pt states that itching has improved slightly, but states that she still feels miserable

## 2022-06-12 NOTE — ED Notes (Signed)
Patient ambulatory to restroom without assistance. 

## 2022-06-20 DIAGNOSIS — H9191 Unspecified hearing loss, right ear: Secondary | ICD-10-CM | POA: Diagnosis not present

## 2022-06-20 DIAGNOSIS — J0100 Acute maxillary sinusitis, unspecified: Secondary | ICD-10-CM | POA: Diagnosis not present

## 2022-06-20 DIAGNOSIS — J310 Chronic rhinitis: Secondary | ICD-10-CM | POA: Diagnosis not present

## 2022-06-25 DIAGNOSIS — H9191 Unspecified hearing loss, right ear: Secondary | ICD-10-CM | POA: Diagnosis not present

## 2022-07-05 ENCOUNTER — Ambulatory Visit
Admission: RE | Admit: 2022-07-05 | Discharge: 2022-07-05 | Disposition: A | Discharge status: Home / Self Care | Payer: BC Managed Care – PPO | Source: Ambulatory Visit | Attending: Obstetrics and Gynecology | Admitting: Obstetrics and Gynecology

## 2022-07-05 DIAGNOSIS — Z1231 Encounter for screening mammogram for malignant neoplasm of breast: Secondary | ICD-10-CM | POA: Diagnosis not present

## 2022-07-05 DIAGNOSIS — Z803 Family history of malignant neoplasm of breast: Secondary | ICD-10-CM | POA: Insufficient documentation

## 2022-07-08 ENCOUNTER — Encounter: Payer: Self-pay | Admitting: Obstetrics and Gynecology

## 2022-07-23 DIAGNOSIS — H9041 Sensorineural hearing loss, unilateral, right ear, with unrestricted hearing on the contralateral side: Secondary | ICD-10-CM | POA: Diagnosis not present

## 2022-07-23 DIAGNOSIS — J320 Chronic maxillary sinusitis: Secondary | ICD-10-CM | POA: Diagnosis not present

## 2022-07-23 DIAGNOSIS — H905 Unspecified sensorineural hearing loss: Secondary | ICD-10-CM | POA: Diagnosis not present

## 2022-07-23 DIAGNOSIS — H9319 Tinnitus, unspecified ear: Secondary | ICD-10-CM | POA: Diagnosis not present

## 2022-08-10 ENCOUNTER — Telehealth: Payer: Self-pay

## 2022-08-10 DIAGNOSIS — Z713 Dietary counseling and surveillance: Secondary | ICD-10-CM

## 2022-08-10 NOTE — Telephone Encounter (Signed)
Pt calling for refill of synthroid , labs are scheduled and phentermine 37.5mg   30d supply; is hoping to get these called in so she can p/u by 5pm as she is leaving to go out of town.  Adv pt [facility] is out of the office but checks her msgs periodically and even if I send the to another provider in the office I could not guarantee they will by sent in by 5.  If she doesn't get them before she leave adv pt to call next with the name of a pharmacy where she is and may be we can call it in to them.

## 2022-08-11 ENCOUNTER — Other Ambulatory Visit: Payer: Self-pay | Admitting: Obstetrics and Gynecology

## 2022-08-11 DIAGNOSIS — E039 Hypothyroidism, unspecified: Secondary | ICD-10-CM

## 2022-08-12 ENCOUNTER — Other Ambulatory Visit: Payer: Self-pay | Admitting: Obstetrics and Gynecology

## 2022-08-12 DIAGNOSIS — E039 Hypothyroidism, unspecified: Secondary | ICD-10-CM

## 2022-08-12 MED ORDER — SYNTHROID 100 MCG PO TABS
100.0000 ug | ORAL_TABLET | Freq: Every day | ORAL | 0 refills | Status: DC
Start: 2022-08-12 — End: 2022-08-27

## 2022-08-12 NOTE — Progress Notes (Signed)
Rx RF levo till lab appt 08/24/22

## 2022-08-12 NOTE — Telephone Encounter (Signed)
1 mo RF levo sent to [facility]. Will call pt re: phentermine RF

## 2022-08-13 MED ORDER — PHENTERMINE HCL 37.5 MG PO TABS
37.5000 mg | ORAL_TABLET | Freq: Every day | ORAL | 1 refills | Status: DC
Start: 2022-08-13 — End: 2023-11-14

## 2022-08-13 NOTE — Telephone Encounter (Signed)
Spoke with pt. Has enough levo till she gets back. Doing diet changes/exercise every morning with about 20# wt loss. Takes phentermine every other day or so to help with snacking. Rx RF eRxd.

## 2022-08-20 DIAGNOSIS — H9041 Sensorineural hearing loss, unilateral, right ear, with unrestricted hearing on the contralateral side: Secondary | ICD-10-CM | POA: Diagnosis not present

## 2022-08-20 DIAGNOSIS — H9319 Tinnitus, unspecified ear: Secondary | ICD-10-CM | POA: Diagnosis not present

## 2022-08-22 DIAGNOSIS — H9191 Unspecified hearing loss, right ear: Secondary | ICD-10-CM | POA: Diagnosis not present

## 2022-08-23 ENCOUNTER — Other Ambulatory Visit: Payer: BC Managed Care – PPO

## 2022-08-23 DIAGNOSIS — Z0000 Encounter for general adult medical examination without abnormal findings: Secondary | ICD-10-CM | POA: Diagnosis not present

## 2022-08-23 DIAGNOSIS — E039 Hypothyroidism, unspecified: Secondary | ICD-10-CM

## 2022-08-23 DIAGNOSIS — Z13220 Encounter for screening for lipoid disorders: Secondary | ICD-10-CM | POA: Diagnosis not present

## 2022-08-24 ENCOUNTER — Other Ambulatory Visit: Payer: BC Managed Care – PPO

## 2022-08-24 LAB — LIPID PANEL
Chol/HDL Ratio: 3.6 ratio (ref 0.0–4.4)
Cholesterol, Total: 227 mg/dL — ABNORMAL HIGH (ref 100–199)
HDL: 63 mg/dL (ref >39)
LDL Chol Calc (NIH): 152 mg/dL — ABNORMAL HIGH (ref 0–99)
Triglycerides: 71 mg/dL (ref 0–149)
VLDL Cholesterol Cal: 12 mg/dL (ref 5–40)

## 2022-08-24 LAB — COMPREHENSIVE METABOLIC PANEL
ALT: 11 IU/L (ref 0–32)
AST: 16 IU/L (ref 0–40)
Albumin: 4.4 g/dL (ref 3.8–4.9)
Alkaline Phosphatase: 108 IU/L (ref 44–121)
BUN/Creatinine Ratio: 13 (ref 12–28)
BUN: 11 mg/dL (ref 8–27)
Bilirubin Total: 0.3 mg/dL (ref 0.0–1.2)
CO2: 24 mmol/L (ref 20–29)
Calcium: 9.5 mg/dL (ref 8.7–10.3)
Chloride: 100 mmol/L (ref 96–106)
Creatinine, Ser: 0.86 mg/dL (ref 0.57–1.00)
Globulin, Total: 2.6 g/dL (ref 1.5–4.5)
Glucose: 87 mg/dL (ref 70–99)
Potassium: 4.8 mmol/L (ref 3.5–5.2)
Sodium: 138 mmol/L (ref 134–144)
Total Protein: 7 g/dL (ref 6.0–8.5)
eGFR: 77 mL/min/{1.73_m2} (ref >59)

## 2022-08-24 LAB — TSH: TSH: 14.2 u[IU]/mL — ABNORMAL HIGH (ref 0.450–4.500)

## 2022-08-24 LAB — T4, FREE: Free T4: 1.1 ng/dL (ref 0.82–1.77)

## 2022-08-27 ENCOUNTER — Telehealth: Payer: Self-pay | Admitting: Obstetrics and Gynecology

## 2022-08-27 DIAGNOSIS — E039 Hypothyroidism, unspecified: Secondary | ICD-10-CM

## 2022-08-27 MED ORDER — SYNTHROID 112 MCG PO TABS
112.0000 ug | ORAL_TABLET | Freq: Every day | ORAL | 0 refills | Status: DC
Start: 2022-08-27 — End: 2022-11-01

## 2022-08-27 NOTE — Telephone Encounter (Addendum)
Pt aware of lipid and thyroid levels. TSH elevated again. On synthroid brand 100 mcg daily. Will increase to 112 mcg daily (was on this dose before but lowered). Rx eRxd, recheck labs in 1 mo. Pt has had some wt loss with diet/exercise/phentermine.

## 2022-10-09 ENCOUNTER — Other Ambulatory Visit: Payer: BC Managed Care – PPO

## 2022-10-09 ENCOUNTER — Ambulatory Visit: Payer: BC Managed Care – PPO | Admitting: Internal Medicine

## 2022-10-11 ENCOUNTER — Other Ambulatory Visit: Payer: BC Managed Care – PPO

## 2022-10-11 ENCOUNTER — Ambulatory Visit: Payer: BC Managed Care – PPO | Admitting: Internal Medicine

## 2022-10-16 ENCOUNTER — Encounter: Payer: BC Managed Care – PPO | Admitting: Family Medicine

## 2022-10-17 DIAGNOSIS — H9041 Sensorineural hearing loss, unilateral, right ear, with unrestricted hearing on the contralateral side: Secondary | ICD-10-CM | POA: Diagnosis not present

## 2022-10-24 ENCOUNTER — Other Ambulatory Visit: Payer: Self-pay | Admitting: Obstetrics and Gynecology

## 2022-10-24 DIAGNOSIS — E039 Hypothyroidism, unspecified: Secondary | ICD-10-CM

## 2022-10-25 ENCOUNTER — Other Ambulatory Visit: Payer: BC Managed Care – PPO

## 2022-10-25 DIAGNOSIS — E039 Hypothyroidism, unspecified: Secondary | ICD-10-CM

## 2022-10-26 ENCOUNTER — Ambulatory Visit: Payer: BC Managed Care – PPO | Admitting: Internal Medicine

## 2022-10-26 ENCOUNTER — Other Ambulatory Visit: Payer: BC Managed Care – PPO

## 2022-10-26 LAB — T4, FREE: Free T4: 1.64 ng/dL (ref 0.82–1.77)

## 2022-10-26 LAB — TSH: TSH: 0.205 u[IU]/mL — ABNORMAL LOW (ref 0.450–4.500)

## 2022-10-29 ENCOUNTER — Inpatient Hospital Stay: Payer: BC Managed Care – PPO

## 2022-10-29 ENCOUNTER — Inpatient Hospital Stay: Payer: BC Managed Care – PPO | Admitting: Internal Medicine

## 2022-11-01 ENCOUNTER — Other Ambulatory Visit: Payer: Self-pay | Admitting: Obstetrics and Gynecology

## 2022-11-01 ENCOUNTER — Telehealth: Payer: Self-pay | Admitting: Obstetrics and Gynecology

## 2022-11-01 DIAGNOSIS — E039 Hypothyroidism, unspecified: Secondary | ICD-10-CM

## 2022-11-01 MED ORDER — SYNTHROID 112 MCG PO TABS: ORAL_TABLET | ORAL | 0 refills | Status: DC

## 2022-11-01 NOTE — Telephone Encounter (Signed)
Pt aware of TSH labs. Pt was on 112 mcg daily since 6/24, increased from 100 mcg daily. Now TSH too low. Pt's TSH has been fluctuating past yr but pt has lost 38#.  Will cont 112 mcg daily EXCEPT 1/2 tab Sun. Recheck labs in 4 wks, Rx RF for 1 mo.

## 2022-11-01 NOTE — Progress Notes (Signed)
Rx RF synthroid 112 mcg daily, except change to 1/2 tab on Sundays due too low TSH. Pt has lost 38#. Recheck labs in 4 wks.

## 2022-11-08 ENCOUNTER — Encounter: Payer: BC Managed Care – PPO | Admitting: Family Medicine

## 2022-12-03 ENCOUNTER — Inpatient Hospital Stay (HOSPITAL_BASED_OUTPATIENT_CLINIC_OR_DEPARTMENT_OTHER): Payer: BC Managed Care – PPO | Admitting: Internal Medicine

## 2022-12-03 ENCOUNTER — Inpatient Hospital Stay: Payer: BC Managed Care – PPO | Attending: Internal Medicine

## 2022-12-03 ENCOUNTER — Encounter: Payer: Self-pay | Admitting: Internal Medicine

## 2022-12-03 VITALS — BP 124/67 | HR 57 | Temp 98.1°F | Ht 61.0 in | Wt 156.4 lb

## 2022-12-03 DIAGNOSIS — Z803 Family history of malignant neoplasm of breast: Secondary | ICD-10-CM | POA: Insufficient documentation

## 2022-12-03 DIAGNOSIS — Z808 Family history of malignant neoplasm of other organs or systems: Secondary | ICD-10-CM | POA: Insufficient documentation

## 2022-12-03 DIAGNOSIS — R609 Edema, unspecified: Secondary | ICD-10-CM | POA: Diagnosis not present

## 2022-12-03 DIAGNOSIS — I82502 Chronic embolism and thrombosis of unspecified deep veins of left lower extremity: Secondary | ICD-10-CM

## 2022-12-03 DIAGNOSIS — Z86718 Personal history of other venous thrombosis and embolism: Secondary | ICD-10-CM | POA: Diagnosis not present

## 2022-12-03 DIAGNOSIS — Z8041 Family history of malignant neoplasm of ovary: Secondary | ICD-10-CM | POA: Diagnosis not present

## 2022-12-03 DIAGNOSIS — Z86711 Personal history of pulmonary embolism: Secondary | ICD-10-CM | POA: Diagnosis not present

## 2022-12-03 DIAGNOSIS — Z7901 Long term (current) use of anticoagulants: Secondary | ICD-10-CM | POA: Diagnosis not present

## 2022-12-03 DIAGNOSIS — Z800 Family history of malignant neoplasm of digestive organs: Secondary | ICD-10-CM | POA: Diagnosis not present

## 2022-12-03 LAB — CBC WITH DIFFERENTIAL/PLATELET
Abs Immature Granulocytes: 0.01 10*3/uL (ref 0.00–0.07)
Basophils Absolute: 0.1 10*3/uL (ref 0.0–0.1)
Basophils Relative: 1 %
Eosinophils Absolute: 0.1 10*3/uL (ref 0.0–0.5)
Eosinophils Relative: 2 %
HCT: 39.1 % (ref 36.0–46.0)
Hemoglobin: 12.8 g/dL (ref 12.0–15.0)
Immature Granulocytes: 0 %
Lymphocytes Relative: 40 %
Lymphs Abs: 2.1 10*3/uL (ref 0.7–4.0)
MCH: 29 pg (ref 26.0–34.0)
MCHC: 32.7 g/dL (ref 30.0–36.0)
MCV: 88.5 fL (ref 80.0–100.0)
Monocytes Absolute: 0.4 10*3/uL (ref 0.1–1.0)
Monocytes Relative: 7 %
Neutro Abs: 2.7 10*3/uL (ref 1.7–7.7)
Neutrophils Relative %: 50 %
Platelets: 298 10*3/uL (ref 150–400)
RBC: 4.42 MIL/uL (ref 3.87–5.11)
RDW: 13.1 % (ref 11.5–15.5)
WBC: 5.3 10*3/uL (ref 4.0–10.5)
nRBC: 0 % (ref 0.0–0.2)

## 2022-12-03 LAB — COMPREHENSIVE METABOLIC PANEL
ALT: 16 U/L (ref 0–44)
AST: 19 U/L (ref 15–41)
Albumin: 3.9 g/dL (ref 3.5–5.0)
Alkaline Phosphatase: 86 U/L (ref 38–126)
Anion gap: 7 (ref 5–15)
BUN: 15 mg/dL (ref 6–20)
CO2: 27 mmol/L (ref 22–32)
Calcium: 9.1 mg/dL (ref 8.9–10.3)
Chloride: 103 mmol/L (ref 98–111)
Creatinine, Ser: 0.77 mg/dL (ref 0.44–1.00)
GFR, Estimated: >60 mL/min (ref >60)
Glucose, Bld: 94 mg/dL (ref 70–99)
Potassium: 3.8 mmol/L (ref 3.5–5.1)
Sodium: 137 mmol/L (ref 135–145)
Total Bilirubin: 0.2 mg/dL — ABNORMAL LOW (ref 0.3–1.2)
Total Protein: 7.3 g/dL (ref 6.5–8.1)

## 2022-12-03 NOTE — Progress Notes (Signed)
[facility] CONSULT NOTE  Patient Care Team: Excell Seltzer, MD as PCP - General (Family Medicine) Kieth Brightly, MD as Consulting Physician (General Surgery) Earna Coder, MD as Consulting Physician (Oncology)  CHIEF COMPLAINTS/PURPOSE OF CONSULTATION:   # 2003- DVT LLE (behind Knee) [? 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; Superficial superficial vein- Left Medial LLE- started on Lovenox 80mg  BID; June 2017- On Eliquis BID  HISTORY OF PRESENTING ILLNESS: Alone.  Ambulating independently.  Taylor Frederick 62 y.o.  female above history of recurrent DVT; prior history of PE- currently on Eliquis.  Patient had recent scans of brain in [facility]- for hearing loss/tinnitus.   Patient continues to be compliant with Eliquis. Patient denies any new blood clots.  Denies any blood in stools or black or stools.  Chronic mild swelling of the legs.  Intermittently using compression stockings.  Review of Systems  Constitutional:  Positive for malaise/fatigue. Negative for chills, diaphoresis, fever and weight loss.  HENT:  Negative for nosebleeds and sore throat.   Eyes:  Negative for double vision.  Respiratory:  Negative for cough, hemoptysis, sputum production, shortness of breath and wheezing.   Cardiovascular:  Positive for leg swelling. Negative for chest pain, palpitations and orthopnea.  Gastrointestinal:  Negative for abdominal pain, blood in stool, constipation, diarrhea, heartburn, melena, nausea and vomiting.  Genitourinary:  Negative for dysuria, frequency and urgency.  Musculoskeletal:  Negative for back pain.  Skin: Negative.  Negative for itching and  rash.  Neurological:  Negative for dizziness, tingling, focal weakness, weakness and headaches.  Endo/Heme/Allergies:  Does not bruise/bleed easily.  Psychiatric/Behavioral:  Negative for depression. The patient is not nervous/anxious and does not have insomnia.    MEDICAL HISTORY:  Past Medical History:  Diagnosis Date   Asymptomatic varicose veins    Diffuse cystic mastopathy    DVT (deep venous thrombosis) (HCC) 2003   With BCP's   Edema of both legs    Hypothyroidism    Venous insufficiency     SURGICAL HISTORY: Past Surgical History:  Procedure Laterality Date   DILATION AND CURETTAGE OF UTERUS  ~2003   LEG SURGERY  01/2015   ORIF ANKLE FRACTURE Left 01/31/2015   Procedure: OPEN REDUCTION INTERNAL FIXATION (ORIF) DISTAL TIBIA  FRACTURE;  Surgeon: Christena Flake, MD;  [name];  Service: Orthopedics;  Laterality: Left;   PERIPHERAL VASCULAR CATHETERIZATION N/A 02/24/2015   Procedure: IVC Filter Insertion;  Surgeon: Annice Needy, MD;  Location: [facility] INVASIVE CV LAB;  Service: Cardiovascular;  [name];   PERIPHERAL VASCULAR CATHETERIZATION N/A 06/20/2015   Procedure: IVC Filter Removal;  Surgeon: Annice Needy, MD;  Location: [facility] INVASIVE CV LAB;  Service: Cardiovascular;  [name];   TUBAL LIGATION  2005   vein closure Left 2008   Left Leg   Vein Procedure     Dr. Evette Cristal    SOCIAL HISTORY: Social History   Socioeconomic History   Marital status: Married    Spouse name: Jonny Ruiz "Mardelle Matte"   Number of children: 1   Years of education: Not on file   Highest education level: Not on file  Occupational History  Occupation: Print production planner    Comment: Patent attorney   Occupation:      Comment:    Tobacco Use   Smoking status: Never   Smokeless tobacco: Never  Vaping Use   Vaping status: Never Used  Substance and Sexual Activity   Alcohol use: No   Drug use: No   Sexual activity: Yes    Birth control/protection: Post-menopausal  Other Topics  Concern   Not on file  Social History Narrative     Diet: started phentermine in last week for weight loss., working on low carb diet    Exercise:  2 times a week, treadmill   Social Determinants of Corporate investment banker Strain: Not on file  Food Insecurity: Not on file  Transportation Needs: Not on file  Physical Activity: Not on file  Stress: Not on file  Social Connections: Not on file  Intimate Partner Violence: Not on file    FAMILY HISTORY: Family History  Problem Relation Age of Onset   Heart disease Mother        CHF   Diabetes Mother    Hypothyroidism Mother    Cancer Father        stomach   Cervical cancer Sister        stage 3   Breast cancer Maternal Aunt    Cancer Maternal Aunt        Breast and Ovarian  (Believes BRCA positive)   Cancer Maternal Aunt        Melanoma   Breast cancer Other 30       1sr cousin   Cancer Other        Breast CA (Believes BRCA positive) (mom had br/ovar ca)    ALLERGIES:  is allergic to estrogens.  MEDICATIONS:  Current Outpatient Medications  Medication Sig Dispense Refill   cholecalciferol (VITAMIN D3) 25 MCG (1000 UNIT) tablet Take 1,000 Units by mouth daily.     clobetasol ointment (TEMOVATE) 0.05 % Apply to affected area once weekly as maintenance 30 g 1   ELIQUIS 5 MG TABS tablet TAKE 1 TABLET TWICE A DAY 180 tablet 3   famotidine (PEPCID) 20 MG tablet Take 1 tablet (20 mg total) by mouth 2 (two) times daily. 20 tablet 0   hydrOXYzine (ATARAX) 25 MG tablet Take 1 tablet (25 mg total) by mouth 3 (three) times daily as needed for anxiety. 20 tablet 0   levocetirizine (XYZAL) 5 MG tablet Take 1 tablet (5 mg total) by mouth every evening. 30 tablet 0   phentermine (ADIPEX-P) 37.5 MG tablet Take 1 tablet (37.5 mg total) by mouth daily before breakfast. 30 tablet 1   predniSONE (DELTASONE) 10 MG tablet Take 1 tablet (10 mg total) by mouth daily. Day 1-3: take 4 tablets PO daily Day 4-6: take 3 tablets PO daily Day 7-9:  take 2 tablets PO daily Day 10-12: take 1 tablet PO daily 30 tablet 0   SYNTHROID 112 MCG tablet Take 1 tab daily EXCEPT 1/2 tab on Sundays. BRAND NECESSARY 30 tablet 0   No current facility-administered medications for this visit.      Marland Kitchen  PHYSICAL EXAMINATION:   Vitals:   12/03/22 1515  BP: 124/67  Pulse: (!) 57  Temp: 98.1 F (36.7 C)  SpO2: 100%   Filed Weights   12/03/22 1515  Weight: 156 lb 6.4 oz (70.9 kg)    Physical Exam HENT:     Head: Normocephalic and atraumatic.     Mouth/Throat:  Pharynx: No oropharyngeal exudate.  Eyes:     Pupils: Pupils are equal, round, and reactive to light.  Cardiovascular:     Rate and Rhythm: Normal rate and regular rhythm.  Pulmonary:     Effort: Pulmonary effort is normal. No respiratory distress.     Breath sounds: Normal breath sounds. No wheezing.  Abdominal:     General: Bowel sounds are normal. There is no distension.     Palpations: Abdomen is soft. There is no mass.     Tenderness: There is no abdominal tenderness. There is no guarding or rebound.  Musculoskeletal:        General: No tenderness. Normal range of motion.     Cervical back: Normal range of motion and neck supple.  Skin:    General: Skin is warm.  Neurological:     Mental Status: She is alert and oriented to person, place, and time.  Psychiatric:        Mood and Affect: Affect normal.     LABORATORY DATA:  I have reviewed the data as listed Lab Results  Component Value Date   WBC 5.3 12/03/2022   HGB 12.8 12/03/2022   HCT 39.1 12/03/2022   MCV 88.5 12/03/2022   PLT 298 12/03/2022   Recent Labs    04/11/22 0821 08/23/22 0924 12/03/22 1517  NA 136 138 137  K 3.9 4.8 3.8  CL 101 100 103  CO2 27 24 27   GLUCOSE 95 87 94  BUN 12 11 15   CREATININE 0.92 0.86 0.77  CALCIUM 8.9 9.5 9.1  GFRNONAA >60  --  >60  PROT 7.6 7.0 7.3  ALBUMIN 4.1 4.4 3.9  AST 19 16 19   ALT 16 11 16   ALKPHOS 93 108 86  BILITOT 0.6 0.3 0.2*    RADIOGRAPHIC  STUDIES: I have personally reviewed the radiological images as listed and agreed with the findings in the report. No results found.  ASSESSMENT & PLAN:   Leg DVT (deep venous thromboembolism), chronic, left (HCC) #Recurrent DVT/[name] [with acquired risk factors limited mobility-ankle surgery].  On indefinite anticoagulation.  On Eliquis.  Stable.   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 Declines evaluation with vascular at this time. Stable.   # Hypocalcemia-JAN 2024- 9.2 on vit D; recommend ca 500 mg BID.   # Right hearing loss/Tinnitus- ? Etiology s/p MRI/CT scan- s/p ENT-UBC- stable.   # DISPOSITION: # follow up in 6 months- MD/labs- cbc/cmp-Dr.B     Earna Coder, MD 12/03/2022 3:57 PM

## 2022-12-03 NOTE — Progress Notes (Signed)
CT Temporal Bones, MRI Brain W Wo done at [facility].  No questions or concerns today.

## 2022-12-03 NOTE — Assessment & Plan Note (Addendum)
#  Recurrent DVT/[name] [with acquired risk factors limited mobility-ankle surgery].  On indefinite anticoagulation.  On Eliquis.  Stable.   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 Declines evaluation with vascular at this time. Stable.   # Hypocalcemia-JAN 2024- 9.2 on vit D; recommend ca 500 mg BID.   # Right hearing loss/Tinnitus- ? Etiology s/p MRI/CT scan- s/p ENT-UBC- stable.   # DISPOSITION: # follow up in 6 months- MD/labs- cbc/cmp-Dr.B

## 2022-12-21 NOTE — Telephone Encounter (Signed)
Pt calling triage asking if a RF of her thyroid med can be called in. Advised she was suppose to come back to labs in 4 weeks from her last RF. States she has been out of her thyroid med for 4-5 days and wants to know if you can call in 2-3 weeks of it before she gets labs done.

## 2022-12-23 ENCOUNTER — Other Ambulatory Visit: Payer: Self-pay | Admitting: Obstetrics and Gynecology

## 2022-12-23 DIAGNOSIS — E039 Hypothyroidism, unspecified: Secondary | ICD-10-CM

## 2022-12-23 MED ORDER — SYNTHROID 112 MCG PO TABS: ORAL_TABLET | ORAL | 0 refills | Status: DC

## 2022-12-23 NOTE — Telephone Encounter (Signed)
Rx levo eRxd for 1 mo, Pt to check labs in 3 wks, order placed.

## 2022-12-23 NOTE — Progress Notes (Signed)
Rx RF synthroid for 1 mo. Pt was supposed to do 4 wk f/u labs, not done and now pt out of meds. Labs due in 3 wks.

## 2022-12-24 NOTE — Telephone Encounter (Signed)
Pt aware. Transferred to April to schedule lab appt.

## 2023-01-09 ENCOUNTER — Other Ambulatory Visit: Payer: Self-pay | Admitting: *Deleted

## 2023-01-09 DIAGNOSIS — I82401 Acute embolism and thrombosis of unspecified deep veins of right lower extremity: Secondary | ICD-10-CM

## 2023-01-09 MED ORDER — APIXABAN 5 MG PO TABS
5.0000 mg | ORAL_TABLET | Freq: Two times a day (BID) | ORAL | 3 refills | Status: DC
Start: 2023-01-09 — End: 2023-11-14

## 2023-01-09 MED ORDER — APIXABAN 5 MG PO TABS: 5.0000 mg | ORAL_TABLET | Freq: Two times a day (BID) | ORAL | 0 refills | Status: DC

## 2023-01-14 ENCOUNTER — Other Ambulatory Visit: Payer: BC Managed Care – PPO

## 2023-01-14 DIAGNOSIS — E039 Hypothyroidism, unspecified: Secondary | ICD-10-CM

## 2023-01-15 LAB — T4, FREE: Free T4: 1.34 ng/dL (ref 0.82–1.77)

## 2023-01-17 ENCOUNTER — Telehealth: Payer: Self-pay | Admitting: Obstetrics and Gynecology

## 2023-01-17 DIAGNOSIS — E039 Hypothyroidism, unspecified: Secondary | ICD-10-CM

## 2023-01-17 MED ORDER — SYNTHROID 112 MCG PO TABS
ORAL_TABLET | ORAL | 0 refills | Status: DC
Start: 2023-01-17 — End: 2023-02-14

## 2023-01-17 NOTE — Telephone Encounter (Signed)
Pt's TSH was too low 8/24 so levo 112 mcg changed from daily to daily except 1/2 tab on Sun. Pt ran out of meds before having recheck. Restarted dose 3 wks ago and then had labs a couple days ago. TSH=7.850 now with normal free T4. Not sure why fluctuating so drastically. Confirmed still on meds and taking in AM on empty stomach. Will restart 112 mcg levo daily (no 1/2 tab on Sun) and recheck labs in 3 wks. Orders placed, Rx RF. Pt understands.   Meds ordered this encounter  Medications   SYNTHROID 112 MCG tablet    Sig: Take 1 tab daily. BRAND NECESSARY    Dispense:  30 tablet    Refill:  0    Note sig change    Order Specific Question:   Supervising Provider    Answer:   Hildred Laser [[id number]]

## 2023-01-29 ENCOUNTER — Other Ambulatory Visit: Payer: Self-pay | Admitting: Obstetrics and Gynecology

## 2023-01-29 DIAGNOSIS — E039 Hypothyroidism, unspecified: Secondary | ICD-10-CM

## 2023-02-06 LAB — SPECIMEN STATUS REPORT

## 2023-02-06 LAB — TSH: TSH: 7.85 u[IU]/mL — ABNORMAL HIGH (ref 0.450–4.500)

## 2023-02-13 ENCOUNTER — Other Ambulatory Visit: Payer: Self-pay | Admitting: Obstetrics and Gynecology

## 2023-02-13 DIAGNOSIS — E039 Hypothyroidism, unspecified: Secondary | ICD-10-CM

## 2023-02-14 MED ORDER — LEVOTHYROXINE SODIUM 112 MCG PO TABS
ORAL_TABLET | ORAL | 0 refills | Status: DC
Start: 2023-02-14 — End: 2023-03-11

## 2023-02-14 NOTE — Addendum Note (Signed)
Addended by: Kathlene Cote on: 02/14/2023 04:27 PM   Modules accepted: Orders

## 2023-02-14 NOTE — Telephone Encounter (Addendum)
Patient requesting refill of Synthroid. States she did not get her labs done and has been out of her meds for over a week. Per Helmut Muster, ok to refill with same instructions as last time daily except 1/2 tab on Sun. Patient scheduled for lab appointment in three weeks. Refill sent

## 2023-03-07 ENCOUNTER — Other Ambulatory Visit: Payer: Self-pay

## 2023-03-07 ENCOUNTER — Emergency Department: Payer: BC Managed Care – PPO

## 2023-03-07 ENCOUNTER — Other Ambulatory Visit: Payer: BC Managed Care – PPO

## 2023-03-07 ENCOUNTER — Emergency Department
Admission: EM | Admit: 2023-03-07 | Discharge: 2023-03-07 | Disposition: A | Discharge status: Home / Self Care | Payer: BC Managed Care – PPO | Attending: Emergency Medicine | Admitting: Emergency Medicine

## 2023-03-07 DIAGNOSIS — M79604 Pain in right leg: Secondary | ICD-10-CM | POA: Insufficient documentation

## 2023-03-07 DIAGNOSIS — E039 Hypothyroidism, unspecified: Secondary | ICD-10-CM

## 2023-03-07 DIAGNOSIS — Z7901 Long term (current) use of anticoagulants: Secondary | ICD-10-CM | POA: Insufficient documentation

## 2023-03-07 DIAGNOSIS — M7989 Other specified soft tissue disorders: Secondary | ICD-10-CM | POA: Diagnosis not present

## 2023-03-07 NOTE — ED Triage Notes (Signed)
 Pt to ED via POV from home. Pt ambulatory to triage. Pt reports right knee pain/upper thigh pain x2 days. Pt reports hx of DVT and PE and is on Eliquis. Pt denies CP or SOB.

## 2023-03-07 NOTE — Discharge Instructions (Signed)
 Your ultrasound was negative for blood clot.  You did not wish to have any other imaging or testing done today.  Please follow-up with your outpatient provider and orthopedics as needed.  Please return for any new, worsening, or change in symptoms or other concerns.  It was a pleasure caring for you today.

## 2023-03-07 NOTE — ED Provider Notes (Signed)
 [facility] Provider Note    Event Date/Time   First MD Initiated Contact with Patient 03/07/23 1251     (approximate)   History   Leg Pain (Right /)   HPI  Taylor Frederick is a 63 y.o. female with a past medical history of factor V Leiden, DVT/PE on Eliquis  who presents today for evaluation of right leg pain.  Patient reports that her pain is mostly in her anterior knee.  She also feels like her veins are more pronounced in her leg.  She denies any weakness or paresthesias.  She is here because she wants to make sure she does not have a blood clot but does not want any further workup done.  Patient Active Problem List   Diagnosis Date Noted   Allergic reaction 06/11/2022   Family history of breast cancer 12/08/2020   Lichen sclerosus 12/21/2019   Vaginal dryness, menopausal 12/12/2017   Vitamin D  deficiency 11/06/2016   Bilateral edema of lower extremity 07/12/2016   Factor V Leiden mutation (HCC) 01/24/2016   Leg DVT (deep venous thromboembolism), chronic, left (HCC) 01/12/2016   History of pulmonary embolism 02/23/2015   Hypothyroidism 10/19/2009   Venous (peripheral) insufficiency 10/19/2009          Physical Exam   Triage Vital Signs: ED Triage Vitals [03/07/23 1123]  Encounter Vitals Group     BP 139/73     Systolic BP Percentile      Diastolic BP Percentile      Pulse Rate 76     Resp 20     Temp 98 F (36.7 C)     Temp Source Oral     SpO2 99 %     Weight 154 lb (69.9 kg)     Height 5' 3 (1.6 m)     Head Circumference      Peak Flow      Pain Score 6     Pain Loc      Pain Education      Exclude from Growth Chart     Most recent vital signs: Vitals:   03/07/23 1123  BP: 139/73  Pulse: 76  Resp: 20  Temp: 98 F (36.7 C)  SpO2: 99%    Physical Exam Vitals and nursing note reviewed.  Constitutional:      General: Awake and alert. No acute distress.    Appearance: Normal appearance. The patient is normal  weight.  HENT:     Head: Normocephalic and atraumatic.     Mouth: Mucous membranes are moist.  Eyes:     General: PERRL. Normal EOMs        Right eye: No discharge.        Left eye: No discharge.     Conjunctiva/sclera: Conjunctivae normal.  Cardiovascular:     Rate and Rhythm: Normal rate and regular rhythm.     Pulses: Normal pulses.  Pulmonary:     Effort: Pulmonary effort is normal. No respiratory distress.     Breath sounds: Normal breath sounds.  Abdominal:     Abdomen is soft. There is no abdominal tenderness. No rebound or guarding. No distention. Musculoskeletal:        General: No swelling. Normal range of motion.     Cervical back: Normal range of motion and neck supple.  Legs equal in circumference bilaterally.  No tenderness to her right calf.  She has spider veins that are equal bilaterally.  Normal pedal pulses bilaterally.  Full  and normal range of motion of the knees bilaterally.  No erythema or effusion noted.  No varus or valgus laxity. Normal ROM of hips and ankles bilaterally. Right knee:  No deformity or rash. No joint line tenderness. No patellar tenderness, no ballotment Warm and well perfused extremity with 2+ pedal pulses 5/5 strength to dorsiflexion and plantarflexion at the ankle with intact sensation throughout extremity Normal range of motion of the knee, with intact flexion and extension to active and passive range of motion. Extensor mechanism intact. No ligamentous laxity. Negative anterior/posterior drawer/negative lachman, negative mcmurrays No effusion or warmth Intact quadriceps, hamstring function, patellar tendon function Pelvis stable Full ROM of ankle without pain or swelling Foot warm and well perfused Skin:    General: Skin is warm and dry.     Capillary Refill: Capillary refill takes less than 2 seconds.     Findings: No rash.  Neurological:     Mental Status: The patient is awake and alert.      ED Results / Procedures / Treatments    Labs (all labs ordered are listed, but only abnormal results are displayed) Labs Reviewed - No data to display   EKG     RADIOLOGY I independently reviewed and interpreted imaging and agree with radiologists findings.     PROCEDURES:  Critical Care performed:   Procedures   MEDICATIONS ORDERED IN ED: Medications - No data to display   IMPRESSION / MDM / ASSESSMENT AND PLAN / ED COURSE  I reviewed the triage vital signs and the nursing notes.   Differential diagnosis includes, but is not limited to, DVT, Baker's cyst, dependent edema.  Patient is awake and alert, hemodynamically stable and afebrile.  She is nontoxic in appearance.  She has full and normal range of motion of hip, knee, ankle.  There is no erythema or infectious signs or symptoms.  There is no joint effusion or fever to suggest septic arthritis.  She is able to range all of her joints fully, and she is ambulatory with a steady gait.  Ultrasound obtained given her history of PE/DVT and this is negative for any acute findings.  She declined any further workup.  She is reassured by these findings.  We discussed elevation and compression stockings.  We also discussed return precautions.  She asked for orthopedic information for knee pain follow-up, but did not want any further workup or any imaging done today.  This was provided for her.  Patient understands and agrees with plan.  She was discharged in stable condition.   Patient's presentation is most consistent with acute complicated illness / injury requiring diagnostic workup.     FINAL CLINICAL IMPRESSION(S) / ED DIAGNOSES   Final diagnoses:  Right leg pain     Rx / DC Orders   ED Discharge Orders     None        Note:  This document was prepared using Dragon voice recognition software and may include unintentional dictation errors.   [name] E, PA-C 03/07/23 1612    Levander Slate, MD 03/08/23 [phone]

## 2023-03-08 DIAGNOSIS — M2241 Chondromalacia patellae, right knee: Secondary | ICD-10-CM | POA: Diagnosis not present

## 2023-03-08 LAB — T4, FREE: Free T4: 1.45 ng/dL (ref 0.82–1.77)

## 2023-03-08 LAB — TSH: TSH: 4.41 u[IU]/mL (ref 0.450–4.500)

## 2023-03-10 NOTE — Progress Notes (Signed)
 Pls call pt and let her know her TSH level is normal. Is she taking 1 tab 112 mcg daily or daily except 1/2 tab on Sundays? I had said daily 11/24 but pt didn't get 1 mo f/u labs and ran out of Rx. [facility] refilled but Rx says 1/2 tab on Sundays. Want to clarify what she is taking since normal. Will send in for 6 months. Thx.

## 2023-03-11 ENCOUNTER — Other Ambulatory Visit: Payer: Self-pay | Admitting: Obstetrics and Gynecology

## 2023-03-11 DIAGNOSIS — E039 Hypothyroidism, unspecified: Secondary | ICD-10-CM

## 2023-03-11 MED ORDER — LEVOTHYROXINE SODIUM 112 MCG PO TABS
112.0000 ug | ORAL_TABLET | Freq: Every day | ORAL | 1 refills | Status: DC
Start: 2023-03-11 — End: 2023-11-14

## 2023-03-11 NOTE — Progress Notes (Signed)
 Rx RF synthroid, take 1 daily. Labs stable, repeat in 6 months

## 2023-03-11 NOTE — Progress Notes (Signed)
 Rx RF [facility]xd [facility] for 1 daily. Repeat labs in 6 months.

## 2023-04-18 DIAGNOSIS — H9041 Sensorineural hearing loss, unilateral, right ear, with unrestricted hearing on the contralateral side: Secondary | ICD-10-CM | POA: Diagnosis not present

## 2023-06-03 ENCOUNTER — Inpatient Hospital Stay: Payer: BC Managed Care – PPO

## 2023-06-03 ENCOUNTER — Inpatient Hospital Stay: Payer: BC Managed Care – PPO | Admitting: Internal Medicine

## 2023-06-12 ENCOUNTER — Ambulatory Visit
Admission: EM | Admit: 2023-06-12 | Discharge: 2023-06-12 | Disposition: A | Discharge status: Home / Self Care | Attending: Emergency Medicine | Admitting: Emergency Medicine

## 2023-06-12 DIAGNOSIS — H1031 Unspecified acute conjunctivitis, right eye: Secondary | ICD-10-CM | POA: Diagnosis not present

## 2023-06-12 MED ORDER — POLYMYXIN B-TRIMETHOPRIM 10000-0.1 UNIT/ML-% OP SOLN: 1.0000 [drp] | Freq: Four times a day (QID) | OPHTHALMIC | 0 refills | Status: AC

## 2023-06-12 NOTE — ED Provider Notes (Signed)
 Renaldo Fiddler    CSN: [id number] Arrival date & time: 06/12/23  1612      History   Chief Complaint Chief Complaint  Patient presents with   Eye Problem    HPI Disney Ruggiero is a 63 y.o. female.  Patient presents with right eye redness, irritation, drainage, crusting in lashes x 3 days.  Her symptoms have now spread to her left eye.  She wears a contact lens in her right eye but has not been wearing it in the last 2 days and has been wearing her glasses instead.  She denies fever, chills, eye injury, change in vision, eye pain.  The history is provided by the patient and medical records.    Past Medical History:  Diagnosis Date   Asymptomatic varicose veins    Diffuse cystic mastopathy    DVT (deep venous thrombosis) (HCC) 2003   With BCP's   Edema of both legs    Hypothyroidism    Venous insufficiency     Patient Active Problem List   Diagnosis Date Noted   Allergic reaction 06/11/2022   Family history of breast cancer 12/08/2020   Lichen sclerosus 12/21/2019   Vaginal dryness, menopausal 12/12/2017   Vitamin D deficiency 11/06/2016   Bilateral edema of lower extremity 07/12/2016   Factor V Leiden mutation (HCC) 01/24/2016   Leg DVT (deep venous thromboembolism), chronic, left (HCC) 01/12/2016   History of pulmonary embolism 02/23/2015   Hypothyroidism 10/19/2009   Venous (peripheral) insufficiency 10/19/2009    Past Surgical History:  Procedure Laterality Date   DILATION AND CURETTAGE OF UTERUS  ~2003   LEG SURGERY  01/2015   ORIF ANKLE FRACTURE Left 01/31/2015   Procedure: OPEN REDUCTION INTERNAL FIXATION (ORIF) DISTAL TIBIA  FRACTURE;  Surgeon: Christena Flake, MD;  [name];  Service: Orthopedics;  Laterality: Left;   PERIPHERAL VASCULAR CATHETERIZATION N/A 02/24/2015   Procedure: IVC Filter Insertion;  Surgeon: Annice Needy, MD;  Location: [facility];  Service: Cardiovascular;  [name];   PERIPHERAL VASCULAR  CATHETERIZATION N/A 06/20/2015   Procedure: IVC Filter Removal;  Surgeon: Annice Needy, MD;  Location: [facility];  Service: Cardiovascular;  [name];   TUBAL LIGATION  2005   vein closure Left 2008   Left Leg   Vein Procedure     Dr. Evette Cristal    OB History     Gravida  1   Para  1   Term  1   Preterm      AB      Living  1      SAB      IAB      Ectopic      Multiple      Live Births  1        Obstetric Comments  Menstrual age: 74  Age 1st Pregnancy: 58          Home Medications    Prior to Admission medications   Medication Sig Start Date End Date Taking? Authorizing Provider  trimethoprim-polymyxin b (POLYTRIM) ophthalmic solution Place 1 drop into both eyes 4 (four) times daily for 7 days. 06/12/23 06/19/23 Yes Mickie Bail, NP  apixaban (ELIQUIS) 5 MG TABS tablet Take 1 tablet (5 mg total) by mouth 2 (two) times daily. 01/09/23   Earna Coder, MD  apixaban (ELIQUIS) 5 MG TABS tablet Take 1 tablet (5 mg total) by mouth 2 (two) times daily. 01/09/23  Earna Coder, MD  cholecalciferol (VITAMIN D3) 25 MCG (1000 UNIT) tablet Take 1,000 Units by mouth daily.    [provider]  clobetasol ointment (TEMOVATE) 0.05 % Apply to affected area once weekly as maintenance 04/05/22   Copland, Helmut Muster B, PA-C  famotidine (PEPCID) 20 MG tablet Take 1 tablet (20 mg total) by mouth 2 (two) times daily. Patient not taking: Reported on 06/12/2023 06/11/22   Eden Emms, NP  hydrOXYzine (ATARAX) 25 MG tablet Take 1 tablet (25 mg total) by mouth 3 (three) times daily as needed for anxiety. 06/12/22   Minna Antis, MD  levocetirizine (XYZAL) 5 MG tablet Take 1 tablet (5 mg total) by mouth every evening. 06/11/22   Eden Emms, NP  levothyroxine (SYNTHROID) 112 MCG tablet Take 1 tablet (112 mcg total) by mouth daily before breakfast. 03/11/23   Copland, Ilona Sorrel, PA-C  phentermine (ADIPEX-P) 37.5 MG tablet Take 1 tablet (37.5 mg total) by  mouth daily before breakfast. Patient not taking: Reported on 06/12/2023 08/13/22   Copland, Ilona Sorrel, PA-C  predniSONE (DELTASONE) 10 MG tablet Take 1 tablet (10 mg total) by mouth daily. Day 1-3: take 4 tablets PO daily Day 4-6: take 3 tablets PO daily Day 7-9: take 2 tablets PO daily Day 10-12: take 1 tablet PO daily Patient not taking: Reported on 06/12/2023 06/12/22   Minna Antis, MD    Family History Family History  Problem Relation Age of Onset   Heart disease Mother        CHF   Diabetes Mother    Hypothyroidism Mother    Cancer Father        stomach   Cervical cancer Sister        stage 3   Breast cancer Maternal Aunt    Cancer Maternal Aunt        Breast and Ovarian  (Believes BRCA positive)   Cancer Maternal Aunt        Melanoma   Breast cancer Other 10       1sr cousin   Cancer Other        Breast CA (Believes BRCA positive) (mom had br/ovar ca)    Social History Social History   Tobacco Use   Smoking status: Never   Smokeless tobacco: Never  Vaping Use   Vaping status: Never Used  Substance Use Topics   Alcohol use: No   Drug use: No     Allergies   Estrogens   Review of Systems Review of Systems  Constitutional:  Negative for chills and fever.  HENT:  Negative for ear pain and sore throat.   Eyes:  Positive for discharge, redness and itching. Negative for pain and visual disturbance.  Respiratory:  Negative for cough and shortness of breath.      Physical Exam Triage Vital Signs ED Triage Vitals  Encounter Vitals Group     BP      Systolic BP Percentile      Diastolic BP Percentile      Pulse      Resp      Temp      Temp src      SpO2      Weight      Height      Head Circumference      Peak Flow      Pain Score      Pain Loc      Pain Education      Exclude from  Growth Chart    No data found.  Updated Vital Signs BP 133/67   Pulse (!) 59   Temp 98.1 F (36.7 C)   Resp 18   LMP 08/16/2014   SpO2 97%   Visual  Acuity Right Eye Distance: 20/25 Left Eye Distance: 20/20 Bilateral Distance: 20/20  Right Eye Near:   Left Eye Near:    Bilateral Near:     Physical Exam Constitutional:      General: She is not in acute distress. HENT:     Right Ear: Tympanic membrane normal.     Left Ear: Tympanic membrane normal.     Nose: Nose normal.     Mouth/Throat:     Mouth: Mucous membranes are moist.     Pharynx: Oropharynx is clear.  Eyes:     General: Lids are normal. Vision grossly intact.     Extraocular Movements: Extraocular movements intact.     Conjunctiva/sclera:     Right eye: Right conjunctiva is injected.     Left eye: Left conjunctiva is injected.     Pupils: Pupils are equal, round, and reactive to light.     Comments: Conjunctiva injected, R>L.  Cardiovascular:     Rate and Rhythm: Normal rate and regular rhythm.     Heart sounds: Normal heart sounds.  Pulmonary:     Effort: Pulmonary effort is normal. No respiratory distress.     Breath sounds: Normal breath sounds.  Neurological:     Mental Status: She is alert.      UC Treatments / Results  Labs (all labs ordered are listed, but only abnormal results are displayed) Labs Reviewed - No data to display  EKG   Radiology No results found.  Procedures Procedures (including critical care time)  Medications Ordered in UC Medications - No data to display  Initial Impression / Assessment and Plan / UC Course  I have reviewed the triage vital signs and the nursing notes.  Pertinent labs & imaging results that were available during my care of the patient were reviewed by me and considered in my medical decision making (see chart for details).    Arterial conjunctivitis.  Treating with Polytrim eyedrops.  Education provided on conjunctivitis.  Instructed patient to follow-up with PCP if symptoms are not improving.  ED precautions discussed.  Patient agrees to plan of care.   Final Clinical Impressions(s) / UC Diagnoses    Final diagnoses:  Acute bacterial conjunctivitis of right eye     Discharge Instructions      Use the antibiotic eyedrops as prescribed.    Follow-up with your primary care provider if your symptoms are not improving.    Go to the emergency department if you have acute eye pain, changes in your vision, or other concerning symptoms.        ED Prescriptions     Medication Sig Dispense Auth. Provider   trimethoprim-polymyxin b (POLYTRIM) ophthalmic solution Place 1 drop into both eyes 4 (four) times daily for 7 days. 10 mL Mickie Bail, NP      PDMP not reviewed this encounter.   Mickie Bail, NP 06/12/23 [phone]

## 2023-06-12 NOTE — ED Triage Notes (Signed)
 Patient to Urgent Care with complaints of right sided eye drainage/ watering/ irritation/ redness. Using warm compresses.   Symptoms started Sunday in her right eye after wearing a contact. Reports some improvement after removing contact. Now having some irritation in her left eye.

## 2023-06-12 NOTE — Discharge Instructions (Addendum)
Use the antibiotic eyedrops as prescribed.    Follow-up with your primary care provider if your symptoms are not improving.    Go to the emergency department if you have acute eye pain, changes in your vision, or other concerning symptoms.    

## 2023-06-25 DIAGNOSIS — H04203 Unspecified epiphora, bilateral lacrimal glands: Secondary | ICD-10-CM | POA: Diagnosis not present

## 2023-06-25 DIAGNOSIS — H04553 Acquired stenosis of bilateral nasolacrimal duct: Secondary | ICD-10-CM | POA: Diagnosis not present

## 2023-10-21 DIAGNOSIS — H6983 Other specified disorders of Eustachian tube, bilateral: Secondary | ICD-10-CM | POA: Diagnosis not present

## 2023-10-21 DIAGNOSIS — H902 Conductive hearing loss, unspecified: Secondary | ICD-10-CM | POA: Diagnosis not present

## 2023-10-21 DIAGNOSIS — H6123 Impacted cerumen, bilateral: Secondary | ICD-10-CM | POA: Diagnosis not present

## 2023-11-12 NOTE — Progress Notes (Unsigned)
 PCP: Avelina Greig BRAVO, MD   No chief complaint on file.   HPI:      [name] is a 63 y.o. G1P1 who LMP was Patient's last menstrual period was 08/16/2014., presents today for her annual examination.  Her menses are absent due to menopause. She does not have PMB. No vasomotor sx.   Sex activity: single partner, contraception - post menopausal status. She does have vaginal dryness and pain, improved with lubricants. Was worse before LS treatment.  SHE CAN'T HAVE ESTROGENS DUE TO HX OF 2 DVTs. Pt is on blood thinners and thinks she is Factor V Leiden positive.   LS confirmed on bx 10/21. Pt started on clobetasol  oint, treats flares for a few days.   Last Pap: 12/13/20  Results were: no abnormalities /neg HPV DNA. No hx of abn paps. Hx of STDs: none  Last mammogram: 07/05/22  Results were: normal--routine follow-up in 12 months. Had been followed by Dr. Sankar.  There is a FH of breast cancer in her mat aunt, who also had ovar ca, and a mat cousin. She has a 2nd aunt with melanoma. Pt's 2nd cousin did genetic testing and she thinks she was positive. Pt has never had testing done/declined past few yrs. The patient does do self-breast exams.  Colonoscopy: never; scared of procedure. Willing to do cologuard, wants ref again. Never did  Tobacco use: The patient denies current or previous tobacco use. Alcohol use: none  No drug use Exercise: mod active   She does get adequate calcium and Vitamin D   Has hypothyroidism and was euthyroid 1/24 labs. On brand Synthroid  100 mcg, decreased from 112 mcg 11/23. Insurance won't cover branded synthroid . Pt was told by pharmacist in past not to do generic. She is willing to pay out of pocket for it. Due for labs 6/24.  No recent fasting labs but not fasting today. Has been having issues with wt gain. Hasn't done diet changes, is somewhat active. Would like to try phentermine  and do wt watchers together. No recent DM screen.   Past  Medical History:  Diagnosis Date   Asymptomatic varicose veins    Diffuse cystic mastopathy    DVT (deep venous thrombosis) (HCC) 2003   With BCP's   Edema of both legs    Hypothyroidism    Venous insufficiency     Past Surgical History:  Procedure Laterality Date   DILATION AND CURETTAGE OF UTERUS  ~2003   LEG SURGERY  01/2015   ORIF ANKLE FRACTURE Left 01/31/2015   Procedure: OPEN REDUCTION INTERNAL FIXATION (ORIF) DISTAL TIBIA  FRACTURE;  Surgeon: Norleen JINNY Maltos, MD;  [name];  Service: Orthopedics;  Laterality: Left;   PERIPHERAL VASCULAR CATHETERIZATION N/A 02/24/2015   Procedure: IVC Filter Insertion;  Surgeon: Selinda GORMAN Gu, MD;  Location: [facility];  Service: Cardiovascular;  [name];   PERIPHERAL VASCULAR CATHETERIZATION N/A 06/20/2015   Procedure: IVC Filter Removal;  Surgeon: Selinda GORMAN Gu, MD;  Location: [facility];  Service: Cardiovascular;  [name];   TUBAL LIGATION  2005   vein closure Left 2008   Left Leg   Vein Procedure     Dr. Dellie    Family History  Problem Relation Age of Onset   Heart disease Mother        CHF   Diabetes Mother    Hypothyroidism Mother    Cancer Father        stomach  Cervical cancer Sister        stage 3   Breast cancer Maternal Aunt    Cancer Maternal Aunt        Breast and Ovarian  (Believes BRCA positive)   Cancer Maternal Aunt        Melanoma   Breast cancer Other 20       1sr cousin   Cancer Other        Breast CA (Believes BRCA positive) (mom had br/ovar ca)    Social History   Socioeconomic History   Marital status: Married    Spouse name: Norleen Heck   Number of children: 1   Years of education: Not on file   Highest education level: Not on file  Occupational History   Occupation: Print production planner    Comment: Patent attorney   Occupation:      Comment:    Tobacco Use   Smoking status: Never   Smokeless tobacco: Never  Vaping Use   Vaping status: Never Used   Substance and Sexual Activity   Alcohol use: No   Drug use: No   Sexual activity: Yes    Birth control/protection: Post-menopausal  Other Topics Concern   Not on file  Social History Narrative     Diet: started phentermine  in last week for weight loss., working on low carb diet    Exercise:  2 times a week, treadmill   Social Drivers of Corporate investment banker Strain: Not on file  Food Insecurity: Not on file  Transportation Needs: Not on file  Physical Activity: Not on file  Stress: Not on file  Social Connections: Not on file  Intimate Partner Violence: Not on file    No outpatient medications have been marked as taking for the 11/14/23 encounter (Appointment) with Man Bonneau, Bernarda NOVAK, PA-C.      ROS:  Review of Systems  Constitutional:  Negative for fatigue, fever and unexpected weight change.  Respiratory:  Negative for cough, shortness of breath and wheezing.   Cardiovascular:  Negative for chest pain, palpitations and leg swelling.  Gastrointestinal:  Negative for blood in stool, constipation, diarrhea, nausea and vomiting.  Endocrine: Negative for cold intolerance, heat intolerance and polyuria.  Genitourinary:  Negative for dyspareunia, dysuria, flank pain, frequency, genital sores, hematuria, menstrual problem, pelvic pain, urgency, vaginal bleeding, vaginal discharge and vaginal pain.  Musculoskeletal:  Negative for back pain, joint swelling and myalgias.  Skin:  Negative for rash.  Neurological:  Negative for dizziness, syncope, light-headedness, numbness and headaches.  Hematological:  Negative for adenopathy.  Psychiatric/Behavioral:  Negative for agitation, confusion, sleep disturbance and suicidal ideas. The patient is not nervous/anxious.      Objective: LMP 08/16/2014    Physical Exam Constitutional:      Appearance: She is well-developed.  Genitourinary:     Vulva normal.     Right Labia: rash.     Right Labia: No tenderness or lesions.     Left Labia: rash.     Left Labia: No tenderness or lesions.    No vaginal discharge, erythema or tenderness.      Right Adnexa: not tender and no mass present.    Left Adnexa: not tender and no mass present.    No cervical motion tenderness, friability or polyp.     Uterus is not enlarged or tender.  Rectum:     Guaiac result negative.     No anal fissure or tenderness.  Breasts:    Right: No  mass, nipple discharge, skin change or tenderness.     Left: No mass, nipple discharge, skin change or tenderness.  Neck:     Thyroid : No thyromegaly.  Cardiovascular:     Rate and Rhythm: Normal rate and regular rhythm.     Heart sounds: Normal heart sounds. No murmur heard. Pulmonary:     Effort: Pulmonary effort is normal.     Breath sounds: Normal breath sounds.  Abdominal:     Palpations: Abdomen is soft.     Tenderness: There is no abdominal tenderness. There is no guarding or rebound.  Musculoskeletal:        General: Normal range of motion.     Cervical back: Normal range of motion.  Lymphadenopathy:     Cervical: No cervical adenopathy.  Neurological:     General: No focal deficit present.     Mental Status: She is alert and oriented to person, place, and time.     Cranial Nerves: No cranial nerve deficit.  Skin:    General: Skin is warm and dry.  Psychiatric:        Mood and Affect: Mood normal.        Behavior: Behavior normal.        Thought Content: Thought content normal.        Judgment: Judgment normal.  Vitals reviewed.     Assessment/Plan: Encounter for annual routine gynecological examination  Encounter for screening mammogram for malignant neoplasm of breast - Plan: MM 3D SCREEN BREAST BILATERAL; pt to schedule mammo  Family history of breast cancer - Plan: MM 3D SCREEN BREAST BILATERAL; MyRisk testing discussed; pt to f/u if desires. Needs to do before going on Medicare  Screening for colon cancer - Plan: Cologuard; colonoscopy (medically  preferred)/cologuard discussed. Pt elects cologuard. Ref sent. Will f/u with results.  Lichen sclerosus - Plan: clobetasol  ointment (TEMOVATE ) 0.05 %; Rx RF. Areas not well treated. Recommended once wkly tx as maintenance. Pt aware of yearly check, small risk of SCC.   Hypothyroidism, unspecified type - Plan: TSH, T4, free; Rx RF. Recheck labs 7/24.   Weight loss counseling, encounter for - Plan: phentermine  (ADIPEX-P ) 37.5 MG tablet; Rx phentermine  for 2 months, start weight watchers. F/u prn.   Screening for diabetes mellitus - Plan: Hemoglobin A1c, Comprehensive metabolic panel; labs today since not fasting and trying to lose wt  Blood tests for routine general physical examination - Plan: TSH, T4, free, Lipid panel, Comprehensive metabolic panel, Hemoglobin A1c, Comprehensive metabolic panel  Screening cholesterol level - Plan: Lipid panel; check in 6 months with TSH/fasting.     No orders of the defined types were placed in this encounter.           GYN counsel breast self exam, mammography screening, menopause, adequate intake of calcium and vitamin D , diet and exercise    F/U  No follow-ups on file.  Saida Lonon B. Taylia Berber, PA-C 11/12/2023 4:58 PM

## 2023-11-14 ENCOUNTER — Encounter: Payer: Self-pay | Admitting: Obstetrics and Gynecology

## 2023-11-14 ENCOUNTER — Ambulatory Visit (INDEPENDENT_AMBULATORY_CARE_PROVIDER_SITE_OTHER): Admitting: Obstetrics and Gynecology

## 2023-11-14 VITALS — BP 115/70 | HR 60 | Ht 63.0 in | Wt 178.0 lb

## 2023-11-14 DIAGNOSIS — Z13220 Encounter for screening for lipoid disorders: Secondary | ICD-10-CM

## 2023-11-14 DIAGNOSIS — E039 Hypothyroidism, unspecified: Secondary | ICD-10-CM | POA: Diagnosis not present

## 2023-11-14 DIAGNOSIS — E669 Obesity, unspecified: Secondary | ICD-10-CM | POA: Diagnosis not present

## 2023-11-14 DIAGNOSIS — Z803 Family history of malignant neoplasm of breast: Secondary | ICD-10-CM

## 2023-11-14 DIAGNOSIS — L900 Lichen sclerosus et atrophicus: Secondary | ICD-10-CM | POA: Diagnosis not present

## 2023-11-14 DIAGNOSIS — Z0000 Encounter for general adult medical examination without abnormal findings: Secondary | ICD-10-CM

## 2023-11-14 DIAGNOSIS — Z6831 Body mass index (BMI) 31.0-31.9, adult: Secondary | ICD-10-CM

## 2023-11-14 DIAGNOSIS — Z1231 Encounter for screening mammogram for malignant neoplasm of breast: Secondary | ICD-10-CM

## 2023-11-14 DIAGNOSIS — R7303 Prediabetes: Secondary | ICD-10-CM | POA: Insufficient documentation

## 2023-11-14 DIAGNOSIS — Z01419 Encounter for gynecological examination (general) (routine) without abnormal findings: Secondary | ICD-10-CM | POA: Diagnosis not present

## 2023-11-14 DIAGNOSIS — Z131 Encounter for screening for diabetes mellitus: Secondary | ICD-10-CM

## 2023-11-14 DIAGNOSIS — Z1211 Encounter for screening for malignant neoplasm of colon: Secondary | ICD-10-CM

## 2023-11-14 MED ORDER — PHENTERMINE HCL 37.5 MG PO TABS: 37.5000 mg | ORAL_TABLET | Freq: Every day | ORAL | 1 refills | Status: AC

## 2023-11-14 MED ORDER — CLOBETASOL PROPIONATE 0.05 % EX OINT: TOPICAL_OINTMENT | CUTANEOUS | 1 refills | Status: AC

## 2023-11-14 MED ORDER — LEVOTHYROXINE SODIUM 112 MCG PO TABS: 112.0000 ug | ORAL_TABLET | Freq: Every day | ORAL | 0 refills | Status: DC

## 2023-11-14 NOTE — Patient Instructions (Signed)
 I value your feedback and you entrusting [place] with your care. If you get a [facility] patient survey, I would appreciate you taking the time to let us know about your experience today. Thank you!  [facility] ([place])--[phone]

## 2023-12-12 DIAGNOSIS — H04553 Acquired stenosis of bilateral nasolacrimal duct: Secondary | ICD-10-CM | POA: Diagnosis not present

## 2023-12-12 DIAGNOSIS — H02132 Senile ectropion of right lower eyelid: Secondary | ICD-10-CM | POA: Diagnosis not present

## 2023-12-17 ENCOUNTER — Other Ambulatory Visit: Payer: Self-pay | Admitting: Obstetrics and Gynecology

## 2023-12-17 ENCOUNTER — Telehealth: Payer: Self-pay

## 2023-12-17 ENCOUNTER — Other Ambulatory Visit: Payer: Self-pay

## 2023-12-17 ENCOUNTER — Ambulatory Visit
Admission: RE | Admit: 2023-12-17 | Discharge: 2023-12-17 | Disposition: A | Discharge status: Home / Self Care | Source: Ambulatory Visit | Attending: Obstetrics and Gynecology | Admitting: Obstetrics and Gynecology

## 2023-12-17 DIAGNOSIS — E039 Hypothyroidism, unspecified: Secondary | ICD-10-CM

## 2023-12-17 DIAGNOSIS — Z1231 Encounter for screening mammogram for malignant neoplasm of breast: Secondary | ICD-10-CM | POA: Insufficient documentation

## 2023-12-17 DIAGNOSIS — Z803 Family history of malignant neoplasm of breast: Secondary | ICD-10-CM | POA: Insufficient documentation

## 2023-12-17 MED ORDER — LEVOTHYROXINE SODIUM 112 MCG PO TABS: 112.0000 ug | ORAL_TABLET | Freq: Every day | ORAL | 0 refills | Status: DC

## 2023-12-17 NOTE — Telephone Encounter (Signed)
 Pt aware.

## 2023-12-17 NOTE — Progress Notes (Signed)
 Rx RF levo till 10/25 lab appt

## 2023-12-17 NOTE — Telephone Encounter (Signed)
 Rx RF eRxd for 1 month to [facility] since pt 4 months past due for labs

## 2023-12-17 NOTE — Telephone Encounter (Signed)
 PT is wanting refills on her as she stated she has labs on 12/26/22 but had apt on 11/14/23 with [facility]

## 2023-12-19 ENCOUNTER — Ambulatory Visit: Payer: Self-pay | Admitting: Obstetrics and Gynecology

## 2023-12-26 ENCOUNTER — Other Ambulatory Visit

## 2023-12-30 ENCOUNTER — Other Ambulatory Visit

## 2023-12-30 DIAGNOSIS — Z131 Encounter for screening for diabetes mellitus: Secondary | ICD-10-CM | POA: Diagnosis not present

## 2023-12-30 DIAGNOSIS — Z13220 Encounter for screening for lipoid disorders: Secondary | ICD-10-CM

## 2023-12-30 DIAGNOSIS — Z0000 Encounter for general adult medical examination without abnormal findings: Secondary | ICD-10-CM

## 2023-12-30 DIAGNOSIS — E039 Hypothyroidism, unspecified: Secondary | ICD-10-CM

## 2023-12-31 ENCOUNTER — Other Ambulatory Visit: Payer: Self-pay | Admitting: Obstetrics and Gynecology

## 2023-12-31 DIAGNOSIS — E039 Hypothyroidism, unspecified: Secondary | ICD-10-CM

## 2023-12-31 LAB — CBC WITH DIFFERENTIAL/PLATELET
Basophils Absolute: 0.1 x10E3/uL (ref 0.0–0.2)
Basos: 1 %
EOS (ABSOLUTE): 0.1 x10E3/uL (ref 0.0–0.4)
Eos: 1 %
Hematocrit: 43.1 % (ref 34.0–46.6)
Hemoglobin: 13.6 g/dL (ref 11.1–15.9)
Immature Grans (Abs): 0 x10E3/uL (ref 0.0–0.1)
Immature Granulocytes: 0 %
Lymphocytes Absolute: 2.1 x10E3/uL (ref 0.7–3.1)
Lymphs: 34 %
MCH: 28.5 pg (ref 26.6–33.0)
MCHC: 31.6 g/dL (ref 31.5–35.7)
MCV: 90 fL (ref 79–97)
Monocytes Absolute: 0.4 x10E3/uL (ref 0.1–0.9)
Monocytes: 6 %
Neutrophils Absolute: 3.4 x10E3/uL (ref 1.4–7.0)
Neutrophils: 58 %
Platelets: 368 x10E3/uL (ref 150–450)
RBC: 4.78 x10E6/uL (ref 3.77–5.28)
RDW: 12.4 % (ref 11.7–15.4)
WBC: 6 x10E3/uL (ref 3.4–10.8)

## 2023-12-31 LAB — LIPID PANEL
Chol/HDL Ratio: 3.3 ratio (ref 0.0–4.4)
Cholesterol, Total: 212 mg/dL — ABNORMAL HIGH (ref 100–199)
HDL: 64 mg/dL (ref >39)
LDL Chol Calc (NIH): 133 mg/dL — ABNORMAL HIGH (ref 0–99)
Triglycerides: 86 mg/dL (ref 0–149)
VLDL Cholesterol Cal: 15 mg/dL (ref 5–40)

## 2023-12-31 LAB — COMPREHENSIVE METABOLIC PANEL WITH GFR
ALT: 16 IU/L (ref 0–32)
AST: 16 IU/L (ref 0–40)
Albumin: 4.1 g/dL (ref 3.9–4.9)
Alkaline Phosphatase: 120 IU/L (ref 49–135)
BUN/Creatinine Ratio: 17 (ref 12–28)
BUN: 13 mg/dL (ref 8–27)
Bilirubin Total: 0.3 mg/dL (ref 0.0–1.2)
CO2: 25 mmol/L (ref 20–29)
Calcium: 9.2 mg/dL (ref 8.7–10.3)
Chloride: 103 mmol/L (ref 96–106)
Creatinine, Ser: 0.75 mg/dL (ref 0.57–1.00)
Globulin, Total: 2.5 g/dL (ref 1.5–4.5)
Glucose: 88 mg/dL (ref 70–99)
Potassium: 4.5 mmol/L (ref 3.5–5.2)
Sodium: 140 mmol/L (ref 134–144)
Total Protein: 6.6 g/dL (ref 6.0–8.5)
eGFR: 91 mL/min/1.73 (ref >59)

## 2023-12-31 LAB — T4, FREE: Free T4: 1.53 ng/dL (ref 0.82–1.77)

## 2023-12-31 LAB — HEMOGLOBIN A1C
Est. average glucose Bld gHb Est-mCnc: 120 mg/dL
Hgb A1c MFr Bld: 5.8 % — ABNORMAL HIGH (ref 4.8–5.6)

## 2023-12-31 LAB — TSH: TSH: 2.86 u[IU]/mL (ref 0.450–4.500)

## 2023-12-31 MED ORDER — LEVOTHYROXINE SODIUM 112 MCG PO TABS: 112.0000 ug | ORAL_TABLET | Freq: Every day | ORAL | 1 refills | Status: AC

## 2023-12-31 NOTE — Progress Notes (Signed)
 Pls tell pt her cholesterol and pre-DM labs improved. Keep up good work with wt loss. Thyroid  normal, Rx RF eRxd to [facility] for 6 months. Needs to have thyroid  labs rechecked 4/26. Should have PCP by then but if not, orders in our system and needs to get done before Rx runs out. All other labs normal.

## 2023-12-31 NOTE — Progress Notes (Signed)
 Rx RF synthroid  112 mcg daily to [facility], recheck labs in 6 months. Pt should have PCP

## 2024-02-19 ENCOUNTER — Telehealth: Payer: Self-pay | Admitting: *Deleted

## 2024-02-19 DIAGNOSIS — I82502 Chronic embolism and thrombosis of unspecified deep veins of left lower extremity: Secondary | ICD-10-CM

## 2024-02-19 NOTE — Telephone Encounter (Signed)
 Patient called triage line to request an apt for labs (cbc/metc) / Dr. B or the APP to follow-up with her h/o DVT. She was supposed to be seen in March but cnl apt. She never called back to r/s. She would like to be r/s after the Christmas. Heron, can you reach out to her with apts. Thanks.  Rosaline, I put her lab orders back in since these were previously d/c.

## 2024-03-17 ENCOUNTER — Other Ambulatory Visit: Payer: Self-pay

## 2024-03-17 ENCOUNTER — Inpatient Hospital Stay: Admitting: Nurse Practitioner

## 2024-03-17 ENCOUNTER — Inpatient Hospital Stay: Attending: Internal Medicine

## 2024-03-17 ENCOUNTER — Encounter: Payer: Self-pay | Admitting: Nurse Practitioner

## 2024-03-17 VITALS — BP 124/70 | HR 64 | Temp 97.6°F | Resp 16 | Wt 182.0 lb

## 2024-03-17 DIAGNOSIS — Z5181 Encounter for therapeutic drug level monitoring: Secondary | ICD-10-CM | POA: Diagnosis not present

## 2024-03-17 DIAGNOSIS — I82401 Acute embolism and thrombosis of unspecified deep veins of right lower extremity: Secondary | ICD-10-CM

## 2024-03-17 DIAGNOSIS — I82502 Chronic embolism and thrombosis of unspecified deep veins of left lower extremity: Secondary | ICD-10-CM

## 2024-03-17 DIAGNOSIS — Z7901 Long term (current) use of anticoagulants: Secondary | ICD-10-CM | POA: Diagnosis not present

## 2024-03-17 LAB — CBC WITH DIFFERENTIAL/PLATELET
Abs Immature Granulocytes: 0.01 K/uL (ref 0.00–0.07)
Basophils Absolute: 0.1 K/uL (ref 0.0–0.1)
Basophils Relative: 1 %
Eosinophils Absolute: 0.1 K/uL (ref 0.0–0.5)
Eosinophils Relative: 1 %
HCT: 41 % (ref 36.0–46.0)
Hemoglobin: 13.5 g/dL (ref 12.0–15.0)
Immature Granulocytes: 0 %
Lymphocytes Relative: 30 %
Lymphs Abs: 1.9 K/uL (ref 0.7–4.0)
MCH: 29 pg (ref 26.0–34.0)
MCHC: 32.9 g/dL (ref 30.0–36.0)
MCV: 88.2 fL (ref 80.0–100.0)
Monocytes Absolute: 0.3 K/uL (ref 0.1–1.0)
Monocytes Relative: 5 %
Neutro Abs: 3.9 K/uL (ref 1.7–7.7)
Neutrophils Relative %: 63 %
Platelets: 380 K/uL (ref 150–400)
RBC: 4.65 MIL/uL (ref 3.87–5.11)
RDW: 12.4 % (ref 11.5–15.5)
WBC: 6.2 K/uL (ref 4.0–10.5)
nRBC: 0 % (ref 0.0–0.2)

## 2024-03-17 LAB — COMPREHENSIVE METABOLIC PANEL WITH GFR
ALT: 12 U/L (ref 0–44)
AST: 20 U/L (ref 15–41)
Albumin: 4.3 g/dL (ref 3.5–5.0)
Alkaline Phosphatase: 118 U/L (ref 38–126)
Anion gap: 10 (ref 5–15)
BUN: 11 mg/dL (ref 8–23)
CO2: 26 mmol/L (ref 22–32)
Calcium: 9.6 mg/dL (ref 8.9–10.3)
Chloride: 103 mmol/L (ref 98–111)
Creatinine, Ser: 0.81 mg/dL (ref 0.44–1.00)
GFR, Estimated: >60 mL/min
Glucose, Bld: 101 mg/dL — ABNORMAL HIGH (ref 70–99)
Potassium: 4.6 mmol/L (ref 3.5–5.1)
Sodium: 139 mmol/L (ref 135–145)
Total Bilirubin: 0.3 mg/dL (ref 0.0–1.2)
Total Protein: 7.5 g/dL (ref 6.5–8.1)

## 2024-03-17 NOTE — Progress Notes (Signed)
 [facility] CONSULT NOTE  Patient Care Team: Avelina Greig BRAVO, MD as PCP - General (Family Medicine) Dellie Louanne MATSU, MD as Consulting Physician (General Surgery) Rennie Cindy SAUNDERS, MD as Consulting Physician (Oncology)  CHIEF COMPLAINTS/PURPOSE OF CONSULTATION:   # 2003- DVT LLE (behind Knee) [? 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; Superficial superficial vein- Left Medial LLE- started on Lovenox  80mg  BID; June 2017- On Eliquis  BID  HISTORY OF PRESENTING ILLNESS: Alone.  Ambulating independently.  Taylor Frederick 64 y.o.  female above history of recurrent DVT; prior history of PE- currently on Eliquis . She returns to clinic for follow up. She continues to be compliant with Eliquis .  No interval blood clots.  She denies any black or bloody stools.  No vaginal bleeding.  No blood in her urine.  She has chronic intermittent edema of her legs that resolves overnight.  She does not use compression stockings due to difficulty applying.  Review of Systems  Constitutional:  Positive for malaise/fatigue. Negative for chills, diaphoresis, fever and weight loss.  HENT:  Negative for nosebleeds and sore throat.   Eyes:  Negative for double vision.  Respiratory:  Negative for cough, hemoptysis, sputum production, shortness of breath and wheezing.   Cardiovascular:  Positive for leg swelling. Negative for chest pain, palpitations and orthopnea.  Gastrointestinal:  Negative for abdominal pain, blood in stool, constipation, diarrhea, heartburn, melena, nausea and vomiting.  Genitourinary:  Negative for dysuria, frequency and urgency.  Musculoskeletal:  Negative for back pain.   Skin: Negative.  Negative for itching and rash.  Neurological:  Negative for dizziness, tingling, focal weakness, weakness and headaches.  Endo/Heme/Allergies:  Does not bruise/bleed easily.  Psychiatric/Behavioral:  Negative for depression. The patient is not nervous/anxious and does not have insomnia.    MEDICAL HISTORY:  Past Medical History:  Diagnosis Date   Asymptomatic varicose veins    Diffuse cystic mastopathy    DVT (deep venous thrombosis) (HCC) 2003   With BCP's   Edema of both legs    Hypothyroidism    Venous insufficiency     SURGICAL HISTORY: Past Surgical History:  Procedure Laterality Date   DILATION AND CURETTAGE OF UTERUS  ~2003   LEG SURGERY  01/2015   ORIF ANKLE FRACTURE Left 01/31/2015   Procedure: OPEN REDUCTION INTERNAL FIXATION (ORIF) DISTAL TIBIA  FRACTURE;  Surgeon: Norleen JINNY Maltos, MD;  [name];  Service: Orthopedics;  Laterality: Left;   PERIPHERAL VASCULAR CATHETERIZATION N/A 02/24/2015   Procedure: IVC Filter Insertion;  Surgeon: Selinda GORMAN Gu, MD;  Location: [facility] IN[facility];  Service: Cardiovascular;  [name];   PERIPHERAL VASCULAR CATHETERIZATION N/A 06/20/2015   Procedure: IVC Filter Removal;  Surgeon: Selinda GORMAN Gu, MD;  Location: [facility];  Service: Cardiovascular;  [name];   TUBAL LIGATION  2005   vein closure Left 2008   Left Leg   Vein Procedure     Dr. Dellie    SOCIAL HISTORY: Social History   Socioeconomic History   Marital status: Married    Spouse name: Norleen Heck   Number of children: 1   Years of education: Not on file  Highest education level: Not on file  Occupational History   Occupation: Print production planner    Comment: Patent Attorney   Occupation:      Comment:    Tobacco Use   Smoking status: Never   Smokeless tobacco: Never  Vaping Use   Vaping status: Never Used  Substance and Sexual Activity   Alcohol use: No   Drug use: No   Sexual activity: Yes    Birth  control/protection: Post-menopausal  Other Topics Concern   Not on file  Social History Narrative     Diet: started phentermine  in last week for weight loss., working on low carb diet    Exercise:  2 times a week, treadmill   Social Drivers of Health   Tobacco Use: Low Risk (03/17/2024)   Patient History    Smoking Tobacco Use: Never    Smokeless Tobacco Use: Never    Passive Exposure: Not on file  Financial Resource Strain: Not on file  Food Insecurity: Not on file  Transportation Needs: Not on file  Physical Activity: Not on file  Stress: Not on file  Social Connections: Not on file  Intimate Partner Violence: Not on file  Depression (PHQ2-9): Low Risk (03/17/2024)   Depression (PHQ2-9)    PHQ-2 Score: 0  Alcohol Screen: Not on file  Housing: Not on file  Utilities: Not on file  Health Literacy: Not on file    FAMILY HISTORY: Family History  Problem Relation Age of Onset   Heart disease Mother        CHF   Diabetes Mother    Hypothyroidism Mother    Cancer Father        stomach   Cervical cancer Sister        stage 3   Breast cancer Maternal Aunt    Cancer Maternal Aunt        Breast and Ovarian  (Believes BRCA positive)   Cancer Maternal Aunt        Melanoma   Breast cancer Other 58       1sr cousin   Cancer Other        Breast CA (Believes BRCA positive) (mom had br/ovar ca)    ALLERGIES:  is allergic to estrogens.  MEDICATIONS:  Current Outpatient Medications  Medication Sig Dispense Refill   apixaban  (ELIQUIS ) 5 MG TABS tablet Take 1 tablet (5 mg total) by mouth 2 (two) times daily. 60 tablet 0   clobetasol  ointment (TEMOVATE ) 0.05 % Apply to affected area once weekly as maintenance 30 g 1   levothyroxine  (SYNTHROID ) 112 MCG tablet Take 1 tablet (112 mcg total) by mouth daily before breakfast. 90 tablet 1   phentermine  (ADIPEX-P ) 37.5 MG tablet Take 1 tablet (37.5 mg total) by mouth daily before breakfast. 30 tablet 1   No current  facility-administered medications for this visit.    PHYSICAL EXAMINATION: Vitals:   03/17/24 1048  BP: 124/70  Pulse: 64  Resp: 16  Temp: 97.6 F (36.4 C)  SpO2: 99%   Filed Weights   03/17/24 1048  Weight: 182 lb (82.6 kg)    Physical Exam Constitutional:      Appearance: She is not ill-appearing.  HENT:     Head: Normocephalic and atraumatic.  Cardiovascular:     Rate and Rhythm: Normal rate and regular rhythm.  Pulmonary:     Effort: Pulmonary effort is normal. No respiratory distress.     Breath sounds: Normal breath sounds. No wheezing.  Abdominal:  General: There is no distension.     Palpations: Abdomen is soft.     Tenderness: There is no abdominal tenderness. There is no guarding.  Musculoskeletal:        General: No tenderness.     Right lower leg: Edema present.     Left lower leg: Edema present.  Skin:    General: Skin is warm.  Neurological:     Mental Status: She is alert and oriented to person, place, and time.  Psychiatric:        Mood and Affect: Mood and affect normal.        Behavior: Behavior normal.    LABORATORY DATA:  I have reviewed the data as listed Lab Results  Component Value Date   WBC 6.2 03/17/2024   HGB 13.5 03/17/2024   HCT 41.0 03/17/2024   MCV 88.2 03/17/2024   PLT 380 03/17/2024   Recent Labs    12/30/23 0813 03/17/24 1036  NA 140 139  K 4.5 4.6  CL 103 103  CO2 25 26  GLUCOSE 88 101*  BUN 13 11  CREATININE 0.75 0.81  CALCIUM 9.2 9.6  GFRNONAA  --  >60  PROT 6.6 7.5  ALBUMIN 4.1 4.3  AST 16 20  ALT 16 12  ALKPHOS 120 118  BILITOT 0.3 0.3    RADIOGRAPHIC STUDIES: I have personally reviewed the radiological images as listed and agreed with the findings in the report. No results found.  ASSESSMENT & PLAN:   Leg DVT (deep venous thromboembolism), chronic, left (HCC) #Recurrent DVT/[name] [with acquired risk factors limited mobility-ankle surgery].  On indefinite  anticoagulation.  On Eliquis .  Stable.    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 Declines evaluation with vascular at this time. Stable.    # Hypocalcemia-JAN 2024- 9.2 on vit D; recommend ca 500 mg BID. Now resolved.    # Right hearing loss/Tinnitus- ? Etiology s/p MRI/CT scan- s/p ENT-UBC- stable.    # DISPOSITION: 6 mo- labs (cbc, cmp), Dr Rennie- la  No problem-specific Assessment & Plan notes found for this encounter.   Tinnie KANDICE Dawn, NP 03/17/2024

## 2024-04-10 ENCOUNTER — Telehealth: Payer: Self-pay | Admitting: *Deleted

## 2024-04-10 DIAGNOSIS — I82401 Acute embolism and thrombosis of unspecified deep veins of right lower extremity: Secondary | ICD-10-CM

## 2024-04-10 MED ORDER — APIXABAN 5 MG PO TABS: 5.0000 mg | ORAL_TABLET | Freq: Two times a day (BID) | ORAL | 0 refills | Status: AC

## 2024-04-10 NOTE — Telephone Encounter (Signed)
 Patient requesting 90 days supply of Eliquis  to be sent to [facility]. She is out of eliquis . She doesn't want to wait on mail order. The next RF she will call back and request it to be sent to [facility]. RF submitted.

## 2024-09-14 ENCOUNTER — Inpatient Hospital Stay: Admitting: Nurse Practitioner

## 2024-09-14 ENCOUNTER — Inpatient Hospital Stay

## 2024-09-14 ENCOUNTER — Inpatient Hospital Stay: Admitting: Internal Medicine
